# Patient Record
Sex: Male | Born: 1975
Health system: Southern US, Community
[De-identification: ages and names within clinical notes are randomized; demographics above are authoritative.]

## PROBLEM LIST (undated history)

## (undated) DIAGNOSIS — H539 Unspecified visual disturbance: Secondary | ICD-10-CM

## (undated) DIAGNOSIS — G35 Multiple sclerosis: Secondary | ICD-10-CM

## (undated) DIAGNOSIS — I10 Essential (primary) hypertension: Secondary | ICD-10-CM

## (undated) DIAGNOSIS — G35D Multiple sclerosis, unspecified: Secondary | ICD-10-CM

## (undated) HISTORY — DX: Unspecified visual disturbance: H53.9

## (undated) HISTORY — DX: Multiple sclerosis: G35

## (undated) HISTORY — DX: Essential (primary) hypertension: I10

## (undated) HISTORY — DX: Multiple sclerosis, unspecified: G35.D

## (undated) HISTORY — PX: ACHILLES TENDON SURGERY: SHX542

## (undated) HISTORY — PX: MENISCUS REPAIR: SHX5179

## (undated) HISTORY — PX: ANTERIOR CRUCIATE LIGAMENT REPAIR: SHX115

---

## 2013-10-15 DIAGNOSIS — S300XXA Contusion of lower back and pelvis, initial encounter: Secondary | ICD-10-CM

## 2016-01-04 ENCOUNTER — Ambulatory Visit: Payer: Self-pay | Admitting: Neurology

## 2016-03-07 ENCOUNTER — Encounter: Payer: Self-pay | Admitting: Neurology

## 2016-03-07 ENCOUNTER — Ambulatory Visit (INDEPENDENT_AMBULATORY_CARE_PROVIDER_SITE_OTHER): Payer: PRIVATE HEALTH INSURANCE | Admitting: Neurology

## 2016-03-07 DIAGNOSIS — G35 Multiple sclerosis: Secondary | ICD-10-CM | POA: Diagnosis not present

## 2016-03-07 DIAGNOSIS — R261 Paralytic gait: Secondary | ICD-10-CM | POA: Diagnosis not present

## 2016-03-07 DIAGNOSIS — R39198 Other difficulties with micturition: Secondary | ICD-10-CM

## 2016-03-07 DIAGNOSIS — R251 Tremor, unspecified: Secondary | ICD-10-CM

## 2016-03-07 MED ORDER — BACLOFEN 10 MG PO TABS: 10.0000 mg | ORAL_TABLET | Freq: Three times a day (TID) | ORAL | 11 refills | Status: DC

## 2016-03-07 NOTE — Progress Notes (Signed)
[facility]  PATIENT: Alexander [name] DOB: May 09, 1976  REFERRING DOCTOR OR PCP:  Dr. Carroll Sage SOURCE:    Notes from Dr. Caralyn Guile, patient   _________________________________   HISTORICAL  CHIEF COMPLAINT:  Chief Complaint  Patient presents with  . Referral    Referral from [facility] for Alexander [name]. Pt has had MS for 5 yers. Pt walks with a cane. Pt has taken ijection and infusion. Pt is Alexander [name]    HISTORY OF PRESENT ILLNESS:  I had the pleasure seeing you patient, Alexander [name], at [facility] for neurologic consultation regarding his multiple sclerosis.    A 40 year old man with multiple sclerosis diagnosed in 2003 transitioning from Gilenya to ocrelizumab.   He just moved to this area from [place]  MS history: In 2003 he had optic neuritis started on the left and then also on the right. He had an MRI of the brain performed at that time and was told that it was normal. He had follow-up MRIs every year or 2 for several more years but they continue to be normal. In 2012, he noticed worsening issues with gait. He had had some difficulty with the gait for the previous couple of years but due to orthopedic issues a neurologic cause was not looked into. An MRI of the cervical spine showed multiple plaques consistent with MS. Initially, he was placed on Copaxone. Although he tolerated Copaxone well he had some breakthrough disease with a new spot developing in the brain. Therefore he was switched to Tysabri. He tolerated Tysabri well and his MS was well controlled. However, she was JCV antibody [name] a decision was made to stop Tysabri after 2 years. Years ago he went on Gilenya. He has tolerated it well and he did better on Tysabri. He was seeing Dr. Carroll Sage at [facility] on [address]. He has enrolled him into Ocrevus and he is going to get the first half of the first dose next  week.  Gait/strength/sensation: He reports difficulty with gait and uses a cane. His left leg is weaker and less coordinated than the right leg. The left hand is also less coordinated than the right hand and he notes mild stiffness in both hands and more moderate stiffness in his legs.  He takes 10 mg of baclofen every morning and sometimes will take another pill later in the day. He denies any numbness in the arms or legs.  Tremor: He has a tremor in his hands that was helped by starting zonisamide 25 mg tablets. The tremor is worse when he is tired.  Bladder: He reports urinary frequency and urgency and has had some incontinence. He has not been tried on any bladder medications. He reports difficulty with urinary hesitancy. He does feel that he empties. He has not had a urinary tract infections.  Vision: He reports that the left eye he covered 100% but the right eye is not at baseline. He has not noticed difficulty with color vision.  Fatigue/sleep:   He denies any major problems with fatigue most of the time. However, he notes more fatigue when he is cold or when he is in hot weather. He generally sleeps well at night.  Mood/cognition: He denies any significant issues with depression or anxiety. He has been on Lexapro for many years. He does not note any major problems with cognition though he will have a little bit of mental fog at times. This was better when he was on  Tysabri than on Gilenya.   REVIEW OF SYSTEMS: Constitutional: No fevers, chills, sweats, or change in appetite.   Reports minimal fatigue. Eyes: No visual changes, double vision, eye pain Ear, nose and throat: No hearing loss, ear pain, nasal congestion, sore throat Cardiovascular: No chest pain, palpitations Respiratory: No shortness of breath at rest or with exertion.   No wheezes GastrointestinaI: No nausea, vomiting, diarrhea, abdominal pain, fecal incontinence Genitourinary: reports frequency, hesitancy and  nocturia. Musculoskeletal: No neck pain, back pain Integumentary: No rash, pruritus, skin lesions Neurological: as above Psychiatric: No depression at this time.  No anxiety Endocrine: No palpitations, diaphoresis, change in appetite, change in weigh or increased thirst Hematologic/Lymphatic: No anemia, purpura, petechiae. Allergic/Immunologic: No itchy/runny eyes, nasal congestion, recent allergic reactions, rashes  ALLERGIES: No Known Allergies  HOME MEDICATIONS:  Current Outpatient Prescriptions:  .  baclofen (LIORESAL) 10 MG tablet, , Disp: , Rfl:  .  escitalopram (LEXAPRO) 10 MG tablet, , Disp: , Rfl:  .  Ibuprofen 200 MG CAPS, Take by mouth., Disp: , Rfl:  .  zonisamide (ZONEGRAN) 25 MG capsule, , Disp: , Rfl:   PAST MEDICAL HISTORY: Past Medical History:  Diagnosis Date  . Multiple sclerosis (HCC)   . Vision abnormalities     PAST SURGICAL HISTORY: History reviewed. No pertinent surgical history.  FAMILY HISTORY: Family History  Problem Relation Age of Onset  . Stroke Maternal Grandfather     SOCIAL HISTORY:  Social History   Social History  . Marital status: Divorced    Spouse name: N/A  . Number of children: N/A  . Years of education: N/A   Occupational History  . Not on file.   Social History Main Topics  . Smoking status: Never Smoker  . Smokeless tobacco: Never Used  . Alcohol use No  . Drug use: No  . Sexual activity: Not on file   Other Topics Concern  . Not on file   Social History Narrative  . No narrative on file     PHYSICAL EXAM  Vitals:   03/07/16 1320  BP: (!) 133/91  Pulse: 79  Weight: 177 lb (80.3 kg)  Height:  (1.727 m)    Body mass index is 26.91 kg/m.   General: The patient is well-developed and well-nourished and in no acute distress  Eyes:  Funduscopic exam shows mild right optic nerve pallor and normal retinal vessels.    Cornea and sclera are normal.  Neck: The neck is supple, no carotid bruits are  noted.  The neck is nontender.  Cardiovascular: The heart has a regular rate and rhythm with a normal S1 and S2. There were no murmurs, gallops or rubs. Lungs are clear to auscultation.  Skin: Extremities are without rash or edema.      Musculoskeletal:  Back is nontender.   Joints have normal range of motion.   Neurologic Exam  Mental status: The patient is alert and oriented x 3 at the time of the examination. The patient has apparent normal recent and remote memory, with an apparently normal attention span and concentration ability.   Speech is normal.  Cranial nerves: Extraocular movements are full. Pupils show 1+ right APD.    Colors are desaturated on the right.  Visual fields are full.  Facial symmetry is present. There is reduced right facial sensation to soft touch and temperature.  .Facial strength is normal.  Trapezius and sternocleidomastoid strength is normal. No dysarthria is noted.  The tongue is midline, and the  patient has symmetric elevation of the soft palate. No obvious hearing deficits are noted.  Motor:  Muscle bulk is normal.   Tone is increased moderately in legs and mildly in left arm. Strength is  5 / 5 in all 4 extremities except 4+/5 EHL muscle  Sensory: Sensory testing shows reduced left vibration sensation in arm/leg and reduced temperature sensation on the right leg  Coordination: Cerebellar testing reveals good finger-nose-finger but slow left RAM and reduced left worse than right heel-to-shin.  Gait and station: Station is normal.   Gait is wide and spastic with mild left foot drop. cannot tandem. Romberg is [name] .   Reflexes: Deep tendon reflexes are symmetric and increased in legs,with spread at both knees and nonsustained clonus at left ankle.    Plantar responses is extensor on the left.    DIAGNOSTIC DATA (LABS, IMAGING, TESTING) - I reviewed patient records, labs, notes, testing and imaging myself where available.      ASSESSMENT AND  PLAN  Spastic gait  Multiple sclerosis (HCC)  Urinary dysfunction  Tremor    In summary, Mr. Alexander [name] is a 40 year old man with multiple sclerosis diagnosed about 5 years ago. He doubled his MS was best controlled on Tysabri but he was JCV antibody [name] and was therefore placed on Gilenya. He is in the process of being transitioned to ocrelizumab and has been off his Gilenya for 5 weeks. He will get his first dose of ocrelizumab up in [place] next week. He would like to get further infusions here in [place]. We will send information to the infusion center. I do not know if we will be able to get insurance preauthorization in time for the second half of his first infusion but we should be easily able to get further infusions here.    I have asked him to increase his baclofen to 10 mg by mouth 3 times a day. We discussed lateral medication but he would prefer to wait at this time. We also briefly discussed Ampyra for the gait disturbance and he would prefer to hold off until he sees how he does on ocrelizumab first.  I will try to get the actual MRI images on CD to personally review and so that we have them available for later comparisons. Additionally, I will try to get his recent laboratory tests as we will need to see negative labs for latent tuberculosis or chronic hepatitis.  He will return to see me in 4 months for regular visit or call sooner if there are new or worsening neurologic symptoms.    Achsah Mcquade A. Epimenio Foot, MD, PhD 03/07/2016, 1:39 PM Certified in Neurology, Clinical Neurophysiology, Sleep Medicine, Pain Medicine and Neuroimaging  [facility] [address], [address] [place], [place] [address] [phone]

## 2016-03-12 ENCOUNTER — Telehealth: Payer: Self-pay | Admitting: Neurology

## 2016-03-12 NOTE — Telephone Encounter (Signed)
After speaking with Inetta Fermo in the infusion suite, I spoke with Alexander [name] and advised that since the first 2 Ocrelizumab infusions are really one unit, (one full dose split into 2 to decrease any side effects), he will need to have both of those infusions in [place].  Once he's had the 2nd infusion, he will come in and sign a new Ocrelizumab srf so we can switch rx'ing physician to [facility], and new benefits investigation can be done to ensure coverage here in [place]/our office.   He verbalized understanding of same, is agreeable with this plan/fim

## 2016-03-12 NOTE — Telephone Encounter (Signed)
Patient called, states he will get first Ocrevus infusion Thursday August 10th in [place], would like to get the 2nd infusion here in [place]. Please call.

## 2016-03-20 ENCOUNTER — Telehealth: Payer: Self-pay | Admitting: *Deleted

## 2016-03-20 NOTE — Telephone Encounter (Signed)
Pt release fax to Dr. Caralyn Guile requesting labs from 2017. A release fax to [facility] requesting  Mri cd.

## 2016-04-29 ENCOUNTER — Ambulatory Visit (INDEPENDENT_AMBULATORY_CARE_PROVIDER_SITE_OTHER): Payer: PRIVATE HEALTH INSURANCE | Admitting: Internal Medicine

## 2016-04-29 ENCOUNTER — Encounter: Payer: Self-pay | Admitting: Internal Medicine

## 2016-04-29 VITALS — BP 140/88 | HR 83 | Temp 98.1°F | Resp 20 | Ht 67.0 in | Wt 179.0 lb

## 2016-04-29 DIAGNOSIS — Z0000 Encounter for general adult medical examination without abnormal findings: Secondary | ICD-10-CM

## 2016-04-29 DIAGNOSIS — Z23 Encounter for immunization: Secondary | ICD-10-CM

## 2016-04-29 MED ORDER — ESCITALOPRAM OXALATE 20 MG PO TABS: 20.0000 mg | ORAL_TABLET | Freq: Every day | ORAL | 6 refills | Status: DC

## 2016-04-29 NOTE — Progress Notes (Signed)
Subjective:    Patient ID: Alexander [name], male    DOB: 07/16/1976, 40 y.o.   MRN: [id number]  HPI  40 year old patient who is seen today to establish with our practice.  Both patients are followed at this practice.  [place] presently living with his parents.  His ex-wife and 42 year old daughter also relocated to the area.  He has a history of MS since at least 2003 and has establish with neurology.  Neurology's detailed note reviewed.  He also presented with optic neuritis and he states that he does have mild decreased visual acuity on the right.  Past medical history otherwise is fairly unremarkable.  He has had left anterior cruciate ligament and left, tendon rupture repair  Social history.  Divorced 42 year old daughter bachelor's degree.  Family history- father history of hypertension, colonic polyps and thrombocytopenia.  May have a component of ITP as well as early cirrhosis.  Status post CABG Mother history of hypertension, history Graves' disease allergic rhinitis One sister is in good health    Review of Systems  Constitutional: Negative for appetite change, chills, fatigue and fever.  HENT: Negative for congestion, dental problem, ear pain, hearing loss, sore throat, tinnitus, trouble swallowing and voice change.   Eyes: Negative for pain, discharge and visual disturbance.  Respiratory: Negative for cough, chest tightness, wheezing and stridor.   Cardiovascular: Negative for chest pain, palpitations and leg swelling.  Gastrointestinal: Negative for abdominal distention, abdominal pain, blood in stool, constipation, diarrhea, nausea and vomiting.  Genitourinary: Positive for difficulty urinating. Negative for discharge, flank pain, genital sores, hematuria and urgency.  Musculoskeletal: Positive for gait problem. Negative for arthralgias, back pain, joint swelling, myalgias and neck stiffness.  Skin: Negative for rash.  Neurological:  Positive for tremors. Negative for dizziness, syncope, speech difficulty, weakness, numbness and headaches.  Hematological: Negative for adenopathy. Does not bruise/bleed easily.  Psychiatric/Behavioral: Positive for dysphoric mood. Negative for behavioral problems. The patient is not nervous/anxious.        Objective:   Physical Exam  Constitutional: He appears well-developed and well-nourished.  HENT:  Head: Normocephalic and atraumatic.  Right Ear: External ear normal.  Left Ear: External ear normal.  Nose: Nose normal.  Mouth/Throat: Oropharynx is clear and moist.  Eyes: Conjunctivae and EOM are normal. Pupils are equal, round, and reactive to light. No scleral icterus.  Neck: Normal range of motion. Neck supple. No JVD present. No thyromegaly present.  Cardiovascular: Regular rhythm, normal heart sounds and intact distal pulses.  Exam reveals no gallop and no friction rub.   No murmur heard. Pulmonary/Chest: Effort normal and breath sounds normal. He exhibits no tenderness.  Abdominal: Soft. Bowel sounds are normal. He exhibits no distension and no mass. There is no tenderness.  Genitourinary: Penis normal.  Musculoskeletal: Normal range of motion. He exhibits no edema or tenderness.  Lymphadenopathy:    He has no cervical adenopathy.  Neurological: He is alert. He has normal reflexes. No cranial nerve deficit. Coordination normal.  Wide-based gait, walks with a cane Lower extremities with hyperreflexia and increased tone  Left Babinski response  Decreased vibratory sensation distally, left greater than the right Slight weakness left dorsiflexion ankle  Skin: Skin is warm and dry. No rash noted.  Psychiatric: He has a normal mood and affect. His behavior is normal.          Assessment & Plan:   MS.  With spastic gait.   Follow-up neurology.  Health records  have been requested and are pending History of optic neuritis.  Patient is status post with a local  ophthalmologist History depression.  Continue Lexapro 20.  Follow-up 6 months or as needed  Preventive health exam.  Flu vaccine administered  Rogelia Boga

## 2016-04-29 NOTE — Patient Instructions (Addendum)
Limit your sodium (Salt) intake  Please check your blood pressure on a regular basis.  If it is consistently greater than 150/90, please make an office appointment.    DASH Eating Plan DASH stands for "Dietary Approaches to Stop Hypertension." The DASH eating plan is a healthy eating plan that has been shown to reduce high blood pressure (hypertension). Additional health benefits may include reducing the risk of type 2 diabetes mellitus, heart disease, and stroke. The DASH eating plan may also help with weight loss. WHAT DO I NEED TO KNOW ABOUT THE DASH EATING PLAN? For the DASH eating plan, you will follow these general guidelines:  Choose foods with a percent daily value for sodium of less than 5% (as listed on the food label).  Use salt-free seasonings or herbs instead of table salt or sea salt.  Check with your health care provider or pharmacist before using salt substitutes.  Eat lower-sodium products, often labeled as "lower sodium" or "no salt added."  Eat fresh foods.  Eat more vegetables, fruits, and low-fat dairy products.  Choose whole grains. Look for the word "whole" as the first word in the ingredient list.  Choose fish and skinless chicken or turkey more often than red meat. Limit fish, poultry, and meat to 6 oz (170 g) each day.  Limit sweets, desserts, sugars, and sugary drinks.  Choose heart-healthy fats.  Limit cheese to 1 oz (28 g) per day.  Eat more home-cooked food and less restaurant, buffet, and fast food.  Limit fried foods.  Cook foods using methods other than frying.  Limit canned vegetables. If you do use them, rinse them well to decrease the sodium.  When eating at a restaurant, ask that your food be prepared with less salt, or no salt if possible. WHAT FOODS CAN I EAT? Seek help from a dietitian for individual calorie needs. Grains Whole grain or whole wheat bread. Brown rice. Whole grain or whole wheat pasta. Quinoa, bulgur, and whole grain  cereals. Low-sodium cereals. Corn or whole wheat flour tortillas. Whole grain cornbread. Whole grain crackers. Low-sodium crackers. Vegetables Fresh or frozen vegetables (raw, steamed, roasted, or grilled). Low-sodium or reduced-sodium tomato and vegetable juices. Low-sodium or reduced-sodium tomato sauce and paste. Low-sodium or reduced-sodium canned vegetables.  Fruits All fresh, canned (in natural juice), or frozen fruits. Meat and Other Protein Products Ground beef (85% or leaner), grass-fed beef, or beef trimmed of fat. Skinless chicken or turkey. Ground chicken or turkey. Pork trimmed of fat. All fish and seafood. Eggs. Dried beans, peas, or lentils. Unsalted nuts and seeds. Unsalted canned beans. Dairy Low-fat dairy products, such as skim or 1% milk, 2% or reduced-fat cheeses, low-fat ricotta or cottage cheese, or plain low-fat yogurt. Low-sodium or reduced-sodium cheeses. Fats and Oils Tub margarines without trans fats. Light or reduced-fat mayonnaise and salad dressings (reduced sodium). Avocado. Safflower, olive, or canola oils. Natural peanut or almond butter. Other Unsalted popcorn and pretzels. The items listed above may not be a complete list of recommended foods or beverages. Contact your dietitian for more options. WHAT FOODS ARE NOT RECOMMENDED? Grains White bread. White pasta. White rice. Refined cornbread. Bagels and croissants. Crackers that contain trans fat. Vegetables Creamed or fried vegetables. Vegetables in a cheese sauce. Regular canned vegetables. Regular canned tomato sauce and paste. Regular tomato and vegetable juices. Fruits Dried fruits. Canned fruit in light or heavy syrup. Fruit juice. Meat and Other Protein Products Fatty cuts of meat. Ribs, chicken wings, bacon, sausage, bologna, salami, chitterlings,   fatback, hot dogs, bratwurst, and packaged luncheon meats. Salted nuts and seeds. Canned beans with salt. Dairy Whole or 2% milk, cream, half-and-half, and  cream cheese. Whole-fat or sweetened yogurt. Full-fat cheeses or blue cheese. Nondairy creamers and whipped toppings. Processed cheese, cheese spreads, or cheese curds. Condiments Onion and garlic salt, seasoned salt, table salt, and sea salt. Canned and packaged gravies. Worcestershire sauce. Tartar sauce. Barbecue sauce. Teriyaki sauce. Soy sauce, including reduced sodium. Steak sauce. Fish sauce. Oyster sauce. Cocktail sauce. Horseradish. Ketchup and mustard. Meat flavorings and tenderizers. Bouillon cubes. Hot sauce. Tabasco sauce. Marinades. Taco seasonings. Relishes. Fats and Oils Butter, stick margarine, lard, shortening, ghee, and bacon fat. Coconut, palm kernel, or palm oils. Regular salad dressings. Other Pickles and olives. Salted popcorn and pretzels. The items listed above may not be a complete list of foods and beverages to avoid. Contact your dietitian for more information. WHERE CAN I FIND MORE INFORMATION? National Heart, Lung, and Blood Institute: www.nhlbi.nih.gov/health/health-topics/topics/dash/   This information is not intended to replace advice given to you by your health care provider. Make sure you discuss any questions you have with your health care provider.   Document Released: 07/11/2011 Document Revised: 08/12/2014 Document Reviewed: 05/26/2013 Elsevier Interactive Patient Education 2016 Elsevier Inc.  

## 2016-04-29 NOTE — Progress Notes (Signed)
Pre visit review using our clinic review tool, if applicable. No additional management support is needed unless otherwise documented below in the visit note. 

## 2016-05-14 ENCOUNTER — Telehealth: Payer: Self-pay | Admitting: Internal Medicine

## 2016-05-14 NOTE — Telephone Encounter (Signed)
Needs a referral to a neuro-optimoligist

## 2016-05-20 NOTE — Telephone Encounter (Signed)
Error

## 2016-06-11 ENCOUNTER — Other Ambulatory Visit: Payer: Self-pay | Admitting: *Deleted

## 2016-06-11 MED ORDER — ESCITALOPRAM OXALATE 20 MG PO TABS: 20.0000 mg | ORAL_TABLET | Freq: Every day | ORAL | 3 refills | Status: DC

## 2016-07-04 ENCOUNTER — Encounter: Payer: Self-pay | Admitting: *Deleted

## 2016-07-10 ENCOUNTER — Encounter: Payer: Self-pay | Admitting: Neurology

## 2016-07-10 ENCOUNTER — Ambulatory Visit (INDEPENDENT_AMBULATORY_CARE_PROVIDER_SITE_OTHER): Payer: BLUE CROSS/BLUE SHIELD | Admitting: Neurology

## 2016-07-10 VITALS — BP 162/100 | HR 94 | Resp 18 | Ht 67.0 in | Wt 183.0 lb

## 2016-07-10 DIAGNOSIS — R39198 Other difficulties with micturition: Secondary | ICD-10-CM

## 2016-07-10 DIAGNOSIS — R29898 Other symptoms and signs involving the musculoskeletal system: Secondary | ICD-10-CM

## 2016-07-10 DIAGNOSIS — R261 Paralytic gait: Secondary | ICD-10-CM

## 2016-07-10 DIAGNOSIS — G35 Multiple sclerosis: Secondary | ICD-10-CM

## 2016-07-10 DIAGNOSIS — R251 Tremor, unspecified: Secondary | ICD-10-CM | POA: Diagnosis not present

## 2016-07-10 MED ORDER — ZONISAMIDE 50 MG PO CAPS: 50.0000 mg | ORAL_CAPSULE | Freq: Every day | ORAL | 5 refills | Status: DC

## 2016-07-10 NOTE — Progress Notes (Signed)
[facility]  PATIENT: Alexander [name] DOB: 03/19/1976  REFERRING DOCTOR OR PCP:  Dr. Carroll Sage SOURCE:    Notes from Dr. Caralyn Guile, patient   _________________________________   HISTORICAL  CHIEF COMPLAINT:  Chief Complaint  Patient presents with  . Multiple Sclerosis    He has  had part A and part B of first Ocrelizumab infusion in [place], at [facility].  He has transferred care to Dr. Epimenio Foot and would like to have future infusions here in our office. Sts. he feels he has less dexterity in his hands (left worse than right).  Sts. legs/gait/balance are about the same. (Ambulatory with cane, slow gait). 25 ft. timed walk is 43 sec today (average of 2)/fim  . Gait Disturbance    HISTORY OF PRESENT ILLNESS:  Alexander [name] is a 40 year old man with multiple sclerosis diagnosed in 2003 transitioning from Gilenya to ocrelizumab.   He just moved to this area from [place].   He had his first 2 ocrelizumab doses in mid and late August and tolerated it well.  He denies definite exacerbation but feels more dexterity issues and numbness in both hands (Left worse than Right).   Typing is more difficult.  He deneis pain. Numbness does not awaken him but he is numb in the morning and shakes for a few minutes with benefit.    He feels it is better since stating Magnesium.    Gait/strength/sensation: He has difficulty with gait and uses a cane. Gait fluctuates a lot but has slowly worsened the past year.     His left leg is weaker and less coordinated than the right leg. The left hand is also less coordinated than the right hand and he notes mild stiffness in both hands and more moderate stiffness in his legs.  He takes 10 mg of baclofen tid with improvement in the spasticity and tightness.  He has hand numness but no leg numbness.        In the past, he was on Ampyra and felt it helped but he felt a little jumpy so stopped.    Tremor: He has a tremor  in his hands that was helped by starting zonisamide 25 mg tablets. The tremor is worse when he is tired and more noticeable with writing.    Bladder: He reports urinary frequency and urgency and has rare incontinence. He has not been tried on any bladder medications. He reports difficulty with urinary hesitancy. He does feel that he empties. He has not had a urinary tract infections.  Vision: He reports that the left eye recovered 100% but the right eye is not at baseline and has reduced . He has not noticed difficulty with color vision.  Fatigue/sleep:   He denies much fatigue. However, he notes some fatigue when he is cold or when he is in hot weather. He generally sleeps well at night.  Mood/cognition: He denies any significant issues with depression or anxiety. He is on Lexapro.  He does not have major problems with cognition though he will have a little bit of mental fog at times. This was better when he was on Tysabri than on Gilenya.  MS history: In 2003 he had optic neuritis started on the left and then also on the right. He had an MRI of the brain performed at that time and was told that it was normal. He had follow-up MRIs every year or 2 for several more years but they continue to be  normal. In 2012, he noticed worsening issues with gait. He had had some difficulty with the gait for the previous couple of years but due to orthopedic issues a neurologic cause was not looked into. An MRI of the cervical spine showed multiple plaques consistent with MS. Initially, he was placed on Copaxone. Although he tolerated Copaxone well he had some breakthrough disease with a new spot developing in the brain. Therefore he was switched to Tysabri. He tolerated Tysabri well and his MS was well controlled. However, she was JCV antibody positive a decision was made to stop Tysabri after 2 years. Years ago he went on Gilenya. He has tolerated it well and he did better on Tysabri. He was seeing Dr. Carroll Sage at [facility] on [address]. He had Ocrevus August 2017.      REVIEW OF SYSTEMS: Constitutional: No fevers, chills, sweats, or change in appetite.   Reports minimal fatigue. Eyes: No visual changes, double vision, eye pain Ear, nose and throat: No hearing loss, ear pain, nasal congestion, sore throat Cardiovascular: No chest pain, palpitations Respiratory: No shortness of breath at rest or with exertion.   No wheezes GastrointestinaI: No nausea, vomiting, diarrhea, abdominal pain, fecal incontinence Genitourinary: reports frequency, hesitancy and nocturia. Musculoskeletal: No neck pain, back pain Integumentary: No rash, pruritus, skin lesions Neurological: as above Psychiatric: No depression at this time.  No anxiety Endocrine: No palpitations, diaphoresis, change in appetite, change in weigh or increased thirst Hematologic/Lymphatic: No anemia, purpura, petechiae. Allergic/Immunologic: No itchy/runny eyes, nasal congestion, recent allergic reactions, rashes  ALLERGIES: No Known Allergies  HOME MEDICATIONS:  Current Outpatient Prescriptions:  .  baclofen (LIORESAL) 10 MG tablet, Take 1 tablet (10 mg total) by mouth 3 (three) times daily. (Patient taking differently: Take 10 mg by mouth as needed. ), Disp: 90 each, Rfl: 11 .  Cholecalciferol (VITAMIN D PO), Take by mouth. Take 5000-7000 units by mouth daily, Disp: , Rfl:  .  escitalopram (LEXAPRO) 20 MG tablet, Take 1 tablet (20 mg total) by mouth daily., Disp: 90 tablet, Rfl: 3 .  Multiple Vitamin (MULTIVITAMIN WITH MINERALS) TABS tablet, Take 1 tablet by mouth daily., Disp: , Rfl:  .  Nutritional Supplements (JUICE PLUS FIBRE) LIQD, Take 12 capsules by mouth daily. FRUITS AND VEGETABLES AND ANTI-OXIDANTS., Disp: , Rfl:  .  Omega-3 Fatty Acids (FISH OIL) 1000 MG CAPS, Take 2 capsules by mouth daily., Disp: , Rfl:  .  zonisamide (ZONEGRAN) 25 MG capsule, Take 25 mg by mouth daily. , Disp: , Rfl:   PAST MEDICAL  HISTORY: Past Medical History:  Diagnosis Date  . Hypertension   . Multiple sclerosis (HCC)   . Vision abnormalities     PAST SURGICAL HISTORY: No past surgical history on file.  FAMILY HISTORY: Family History  Problem Relation Age of Onset  . Stroke Maternal Grandfather     SOCIAL HISTORY:  Social History   Social History  . Marital status: Divorced    Spouse name: N/A  . Number of children: N/A  . Years of education: N/A   Occupational History  . Not on file.   Social History Main Topics  . Smoking status: Never Smoker  . Smokeless tobacco: Never Used  . Alcohol use No  . Drug use: No  . Sexual activity: Not on file   Other Topics Concern  . Not on file   Social History Narrative  . No narrative on file     PHYSICAL EXAM  Vitals:  07/10/16 1422  BP: (!) 162/100  Pulse: 94  Resp: 18  Weight: 183 lb (83 kg)  Height: 5\' 7"  (1.702 m)    Body mass index is 28.66 kg/m.   General: The patient is well-developed and well-nourished and in no acute distress  Eyes:  Funduscopic exam shows mild right optic nerve pallor and normal retinal vessels.    Cornea and sclera are normal.  Neck: The neck is supple, no carotid bruits are noted.  The neck is nontender.  Cardiovascular: The heart has a regular rate and rhythm with a normal S1 and S2. There were no murmurs, gallops or rubs. Lungs are clear to auscultation.  Skin: Extremities are without rash or edema.      Musculoskeletal:  Back is nontender.    Neurologic Exam  Mental status: The patient is alert and oriented x 3 at the time of the examination. The patient has apparent normal recent and remote memory, with an apparently normal attention span and concentration ability.   Speech is normal.  Cranial nerves: Extraocular movements are full. Pupils show 1+ right APD.    Colors are desaturated on the right.   Facial symmetry is present. There is reduced right facial sensation to soft touch and  temperature.  .Facial strength is normal.  Trapezius and sternocleidomastoid strength is normal. No dysarthria is noted.  The tongue is midline, and the patient has symmetric elevation of the soft palate. No obvious hearing deficits are noted.  Motor:  Muscle bulk is normal.   Tone is increased mildly in legs and mildly in left arm. Strength is  5 / 5 in all 4 extremities except 4+/5 EHL muscle  Sensory: Sensory testing shows reduced left vibration sensation in arm/leg and reduced temperature sensation on the right leg  Coordination: Cerebellar testing reveals good finger-nose-finger but slow left RAM and reduced left worse than right heel-to-shin.  Gait and station: Station is normal.   Gait is wide and spastic with mild left foot drop. He cannot tandem. Romberg is positive .   Reflexes: Deep tendon reflexes are symmetric and increased in legs,with spread at both knees and nonsustained clonus at left ankle.    Plantar responses is extensor on the left.  25 foot time walk was 21.5 seconds (average of two trials)    DIAGNOSTIC DATA (LABS, IMAGING, TESTING) - I reviewed patient records, labs, notes, testing and imaging myself where available.      ASSESSMENT AND PLAN  Multiple sclerosis (HCC) - Plan: CBC with Differential/Platelet, Comprehensive metabolic panel  Spastic gait  Urinary dysfunction  Tremor  Hand weakness - Plan: NCV with EMG(electromyography)    1.   Ocrelizumab, next dose in February.  He had questions about vitamins, food supplements and MS. I said that the best evidence is present. Vitamin D. There are some studies that show that biotin and alpha lipoic acid could be of benefit. He was unable to tolerate biotin in the past. He will consider getting alpha lipoic acid 600 mg by mouth twice a day. 2.   Ampyra for walking. 3.   Due to his physical impairments with both hand and leg weakness, he is permanently and totally disabled.    4.   If arm weakness worsens  further, consider NCV/EMG.     He will return to see me in 4 months for regular visit or call sooner if there are new or worsening neurologic symptoms.  45 minutes face-to-face evaluation with greater one time counseling or coordinating  care about his MS, gait disturbance and other symptoms.   Shamira Toutant A. Epimenio Foot, MD, PhD 07/10/2016, 2:47 PM Certified in Neurology, Clinical Neurophysiology, Sleep Medicine, Pain Medicine and Neuroimaging  [facility] [address] [place], [place] [address] [phone]

## 2016-07-11 ENCOUNTER — Telehealth: Payer: Self-pay | Admitting: *Deleted

## 2016-07-11 LAB — CBC WITH DIFFERENTIAL/PLATELET
BASOS: 1 %
Basophils Absolute: 0.1 10*3/uL (ref 0.0–0.2)
EOS (ABSOLUTE): 0.4 10*3/uL (ref 0.0–0.4)
EOS: 7 %
HEMATOCRIT: 44.8 % (ref 37.5–51.0)
Hemoglobin: 15.4 g/dL (ref 13.0–17.7)
Immature Grans (Abs): 0 10*3/uL (ref 0.0–0.1)
Immature Granulocytes: 0 %
LYMPHS ABS: 1.3 10*3/uL (ref 0.7–3.1)
Lymphs: 19 %
MCH: 28.8 pg (ref 26.6–33.0)
MCHC: 34.4 g/dL (ref 31.5–35.7)
MCV: 84 fL (ref 79–97)
MONOS ABS: 0.7 10*3/uL (ref 0.1–0.9)
Monocytes: 11 %
NEUTROS PCT: 62 %
Neutrophils Absolute: 4.1 10*3/uL (ref 1.4–7.0)
PLATELETS: 212 10*3/uL (ref 150–379)
RBC: 5.35 x10E6/uL (ref 4.14–5.80)
RDW: 14 % (ref 12.3–15.4)
WBC: 6.6 10*3/uL (ref 3.4–10.8)

## 2016-07-11 LAB — COMPREHENSIVE METABOLIC PANEL
A/G RATIO: 2 (ref 1.2–2.2)
ALK PHOS: 42 IU/L (ref 39–117)
ALT: 26 IU/L (ref 0–44)
AST: 15 IU/L (ref 0–40)
Albumin: 4.6 g/dL (ref 3.5–5.5)
BUN/Creatinine Ratio: 13 (ref 9–20)
BUN: 13 mg/dL (ref 6–24)
Bilirubin Total: 0.5 mg/dL (ref 0.0–1.2)
CO2: 25 mmol/L (ref 18–29)
Calcium: 9.7 mg/dL (ref 8.7–10.2)
Chloride: 103 mmol/L (ref 96–106)
Creatinine, Ser: 0.97 mg/dL (ref 0.76–1.27)
GFR calc Af Amer: 111 mL/min/{1.73_m2} (ref >59)
GFR calc non Af Amer: 96 mL/min/{1.73_m2} (ref >59)
GLOBULIN, TOTAL: 2.3 g/dL (ref 1.5–4.5)
Glucose: 71 mg/dL (ref 65–99)
Potassium: 4.5 mmol/L (ref 3.5–5.2)
SODIUM: 144 mmol/L (ref 134–144)
Total Protein: 6.9 g/dL (ref 6.0–8.5)

## 2016-07-11 NOTE — Telephone Encounter (Signed)
-----   Message from Asa Lente, MD sent at 07/11/2016  9:07 AM EST ----- Please let him know labs are fine

## 2016-07-11 NOTE — Telephone Encounter (Signed)
I have spoken with Alexander [name] this morning, and per RAS, advised that labs done in our office yesterday look good. He verbalized understanding of same/fim

## 2016-07-12 ENCOUNTER — Encounter: Payer: Self-pay | Admitting: *Deleted

## 2016-07-12 ENCOUNTER — Telehealth: Payer: Self-pay | Admitting: *Deleted

## 2016-07-12 NOTE — Telephone Encounter (Signed)
Release faxed to [facility], requesting medical records.

## 2016-07-16 ENCOUNTER — Encounter: Payer: Self-pay | Admitting: *Deleted

## 2016-07-16 ENCOUNTER — Telehealth: Payer: Self-pay | Admitting: *Deleted

## 2016-07-16 NOTE — Telephone Encounter (Signed)
Ampyra PA completed and faxed to [facility] fax# [phone]/fim

## 2016-07-17 NOTE — Telephone Encounter (Signed)
Fax received from [facility] [place].  Ampyra PA approved.  Effective dates: 07-16-16 thru 12-31-16.  Ref# [id number]/fim

## 2016-08-28 ENCOUNTER — Ambulatory Visit (INDEPENDENT_AMBULATORY_CARE_PROVIDER_SITE_OTHER): Payer: BLUE CROSS/BLUE SHIELD | Admitting: Neurology

## 2016-08-28 ENCOUNTER — Encounter (INDEPENDENT_AMBULATORY_CARE_PROVIDER_SITE_OTHER): Payer: BLUE CROSS/BLUE SHIELD

## 2016-08-28 DIAGNOSIS — R261 Paralytic gait: Secondary | ICD-10-CM

## 2016-08-28 DIAGNOSIS — R29898 Other symptoms and signs involving the musculoskeletal system: Secondary | ICD-10-CM

## 2016-08-28 DIAGNOSIS — G35 Multiple sclerosis: Secondary | ICD-10-CM

## 2016-08-28 NOTE — Progress Notes (Signed)
Patient decided that he would prefer not to do the NCV/EMG as he feels most of his symptoms are from his multiple sclerosis. We discussed checking an MRI of the brain and cervical spine due to the worsening symptoms to determine if there has been subclinical progression. If present,, we will need to consider a disease modifying therapy. We also will schedule his ocrelizumab infusion.

## 2016-09-06 DIAGNOSIS — Z0271 Encounter for disability determination: Secondary | ICD-10-CM

## 2016-09-11 ENCOUNTER — Ambulatory Visit (INDEPENDENT_AMBULATORY_CARE_PROVIDER_SITE_OTHER): Payer: BLUE CROSS/BLUE SHIELD

## 2016-09-11 DIAGNOSIS — G35 Multiple sclerosis: Secondary | ICD-10-CM

## 2016-09-11 DIAGNOSIS — R29898 Other symptoms and signs involving the musculoskeletal system: Secondary | ICD-10-CM | POA: Diagnosis not present

## 2016-09-11 DIAGNOSIS — R261 Paralytic gait: Secondary | ICD-10-CM | POA: Diagnosis not present

## 2016-09-11 MED ORDER — GADOPENTETATE DIMEGLUMINE 469.01 MG/ML IV SOLN: 17.0000 mL | Freq: Once | INTRAVENOUS | Status: DC | PRN

## 2016-09-13 ENCOUNTER — Telehealth: Payer: Self-pay | Admitting: *Deleted

## 2016-09-13 NOTE — Telephone Encounter (Signed)
I have spoken with Roper this morning and reviewed MRI results as below.  He verbalized understanding of same/fim

## 2016-09-13 NOTE — Telephone Encounter (Signed)
-----   Message from Asa Lente, MD sent at 09/12/2016  6:02 PM EST ----- Please let him know that the MRI's did not show any brand-new lesions. He does have several plaques in the spinal cord which likely explains his weakness in the arms.,    Or nerves.

## 2016-09-19 DIAGNOSIS — Z0289 Encounter for other administrative examinations: Secondary | ICD-10-CM

## 2016-09-25 ENCOUNTER — Other Ambulatory Visit: Payer: Self-pay | Admitting: Neurology

## 2016-09-25 DIAGNOSIS — G35 Multiple sclerosis: Secondary | ICD-10-CM

## 2016-09-25 DIAGNOSIS — Z79899 Other long term (current) drug therapy: Secondary | ICD-10-CM

## 2016-09-26 ENCOUNTER — Other Ambulatory Visit (INDEPENDENT_AMBULATORY_CARE_PROVIDER_SITE_OTHER): Payer: Self-pay

## 2016-09-26 DIAGNOSIS — G35 Multiple sclerosis: Secondary | ICD-10-CM

## 2016-09-26 DIAGNOSIS — Z79899 Other long term (current) drug therapy: Secondary | ICD-10-CM

## 2016-09-26 DIAGNOSIS — Z0289 Encounter for other administrative examinations: Secondary | ICD-10-CM

## 2016-09-27 LAB — HEPATITIS B CORE ANTIBODY, TOTAL: HEP B C TOTAL AB: NEGATIVE

## 2016-09-27 LAB — HEPATITIS B SURFACE ANTIGEN: Hepatitis B Surface Ag: NEGATIVE

## 2016-09-27 LAB — HEPATITIS B SURFACE ANTIBODY,QUALITATIVE: HEP B SURFACE AB, QUAL: NONREACTIVE

## 2016-10-01 LAB — QUANTIFERON IN TUBE
QFT TB AG MINUS NIL VALUE: <0 IU/mL
QUANTIFERON MITOGEN VALUE: 6.54 IU/mL
QUANTIFERON NIL VALUE: 0.03 [IU]/mL
QUANTIFERON TB AG VALUE: 0.02 [IU]/mL
QUANTIFERON TB GOLD: NEGATIVE

## 2016-10-01 LAB — QUANTIFERON TB GOLD ASSAY (BLOOD)

## 2016-10-28 ENCOUNTER — Encounter: Payer: Self-pay | Admitting: Neurology

## 2016-10-28 ENCOUNTER — Ambulatory Visit (INDEPENDENT_AMBULATORY_CARE_PROVIDER_SITE_OTHER): Payer: BLUE CROSS/BLUE SHIELD | Admitting: Neurology

## 2016-10-28 ENCOUNTER — Encounter (INDEPENDENT_AMBULATORY_CARE_PROVIDER_SITE_OTHER): Payer: Self-pay

## 2016-10-28 VITALS — BP 128/78 | HR 64 | Resp 16 | Ht 67.0 in | Wt 178.0 lb

## 2016-10-28 DIAGNOSIS — R261 Paralytic gait: Secondary | ICD-10-CM

## 2016-10-28 DIAGNOSIS — Z79899 Other long term (current) drug therapy: Secondary | ICD-10-CM

## 2016-10-28 DIAGNOSIS — R39198 Other difficulties with micturition: Secondary | ICD-10-CM

## 2016-10-28 DIAGNOSIS — R251 Tremor, unspecified: Secondary | ICD-10-CM | POA: Diagnosis not present

## 2016-10-28 DIAGNOSIS — G35 Multiple sclerosis: Secondary | ICD-10-CM

## 2016-10-28 DIAGNOSIS — E559 Vitamin D deficiency, unspecified: Secondary | ICD-10-CM

## 2016-10-28 DIAGNOSIS — R7989 Other specified abnormal findings of blood chemistry: Secondary | ICD-10-CM

## 2016-10-28 MED ORDER — BACLOFEN 10 MG PO TABS: ORAL_TABLET | ORAL | 11 refills | Status: DC

## 2016-10-28 NOTE — Progress Notes (Signed)
[facility]  PATIENT: Alexander [name] DOB: 07/30/1976  REFERRING DOCTOR OR PCP:  Dr. Eleonore Chiquito (PCP)  _________________________________   HISTORICAL  CHIEF COMPLAINT:  Chief Complaint  Patient presents with  . Multiple Sclerosis    Sts. he continues to tolerate Ocrelizumab infusion.  600mg  IV given end of Feb. 2018.  Sts. Ampyra did'n't significantly help, and felt left sided weakness was worse with it, so he stopped it. Sts. is weaning himself off of Zonegran/fim    HISTORY OF PRESENT ILLNESS:  Alexander [name] is a 41 year old man with multiple sclerosis diagnosed in 2003 on ocrelizumab.     MS:   He had his first 2 ocrelizumab doses in mid and late August and tolerated it well. Second infusion was in February and went well.    He denies definite exacerbation but feels more dexterity issues and numbness in both hands (Left worse than Right).   Typing is more difficult.  He deneis pain. Numbness does not awaken him but he is numb in the morning and shakes for a few minutes with benefit.    He feels it is better since stating Magnesium.    Gait/strength/sensation: He has difficulty with gait and uses a cane. Gait fluctuates a lot but has slowly worsened the past year.     His left leg is weaker and less coordinated than the right leg.  He notes more problems with his hands (left worse than right).   Coordination is worse. He has spasticity in his legs.   He takes 10 mg of baclofen tid with improvement in the spasticity and tightness.  He tolerates it well.  He has hand numness but no leg numbness.     Ampyra made him feel worse so he stopped.      Tremor: He has a tremor in his hands.  He was placed on zonisamide by his previous neurologist but it has not helped much .   The tremor is worse when he is tired and more noticeable with writing.    Bladder: He reports urinary frequency and urgency.  No recent incontinence. He has not been tried on any bladder  medications. He reports difficulty with urinary hesitancy.   He has not had a urinary tract infections.  Vision: He has had optic neuritis in the past. The left eye recovered 100%. The right eye improved from the initial optic neuritis but not to baseline.    Np diplopia  Fatigue/sleep:   He notes mild fatigue when he is cold or when he is in hot weather. He generally sleeps well at night.  Mood/cognition: He denies any significant issues with depression or anxiety. He is on Lexapro.  He does not have major problems with cognition though he will have a little bit of mental fog at times. This was better when he was on Tysabri than on Gilenya.  MS history: In 2003 he had optic neuritis started on the left and then also on the right. He had an MRI of the brain performed at that time and was told that it was normal. He had follow-up MRIs every year or 2 for several more years but they continue to be normal. In 2012, he noticed worsening issues with gait. He had had some difficulty with the gait for the previous couple of years but due to orthopedic issues a neurologic cause was not looked into. An MRI of the cervical spine showed multiple plaques consistent with MS. Initially, he was placed on Copaxone.  Although he tolerated Copaxone well he had some breakthrough disease with a new spot developing in the brain. Therefore he was switched to Tysabri. He tolerated Tysabri well and his MS was well controlled. However, she was JCV antibody positive a decision was made to stop Tysabri after 2 years. Years ago he went on Gilenya. He has tolerated it well and he did better on Tysabri. He was seeing Dr. Carroll Sage at [facility] on [place]. He had Ocrevus August 43 2017.      REVIEW OF SYSTEMS: Constitutional: No fevers, chills, sweats, or change in appetite.   Reports minimal fatigue. Eyes: No visual changes, double vision, eye pain Ear, nose and throat: No hearing loss, ear pain, nasal  congestion, sore throat Cardiovascular: No chest pain, palpitations Respiratory: No shortness of breath at rest or with exertion.   No wheezes GastrointestinaI: No nausea, vomiting, diarrhea, abdominal pain, fecal incontinence Genitourinary: reports frequency, hesitancy and nocturia. Musculoskeletal: No neck pain, back pain Integumentary: No rash, pruritus, skin lesions Neurological: as above Psychiatric: No depression at this time.  No anxiety Endocrine: No palpitations, diaphoresis, change in appetite, change in weigh or increased thirst Hematologic/Lymphatic: No anemia, purpura, petechiae. Allergic/Immunologic: No itchy/runny eyes, nasal congestion, recent allergic reactions, rashes  ALLERGIES: No Known Allergies  HOME MEDICATIONS:  Current Outpatient Prescriptions:  .  baclofen (LIORESAL) 10 MG tablet, Take 5 pills daily po  in divided dose, Disp: 90 each, Rfl: 11 .  Cholecalciferol (VITAMIN D PO), Take by mouth. Take 5000-7000 units by mouth daily, Disp: , Rfl:  .  escitalopram (LEXAPRO) 20 MG tablet, Take 1 tablet (20 mg total) by mouth daily., Disp: 90 tablet, Rfl: 3 .  Multiple Vitamin (MULTIVITAMIN WITH MINERALS) TABS tablet, Take 1 tablet by mouth daily., Disp: , Rfl:  .  Nutritional Supplements (JUICE PLUS FIBRE) LIQD, Take 12 capsules by mouth daily. FRUITS AND VEGETABLES AND ANTI-OXIDANTS., Disp: , Rfl:  .  ocrelizumab 600 mg in sodium chloride 0.9 % 500 mL, Inject 600 mg into the vein every 6 (six) months., Disp: , Rfl:  .  Omega-3 Fatty Acids (FISH OIL) 1000 MG CAPS, Take 2 capsules by mouth daily., Disp: , Rfl:  No current facility-administered medications for this visit.   Facility-Administered Medications Ordered in Other Visits:  .  gadopentetate dimeglumine (MAGNEVIST) injection 17 mL, 17 mL, Intravenous, Once PRN, Anson Fret, MD  PAST MEDICAL HISTORY: Past Medical History:  Diagnosis Date  . Hypertension   . Multiple sclerosis (HCC)   . Vision  abnormalities     PAST SURGICAL HISTORY: No past surgical history on file.  FAMILY HISTORY: Family History  Problem Relation Age of Onset  . Stroke Maternal Grandfather     SOCIAL HISTORY:  Social History   Social History  . Marital status: Divorced    Spouse name: N/A  . Number of children: N/A  . Years of education: N/A   Occupational History  . Not on file.   Social History Main Topics  . Smoking status: Never Smoker  . Smokeless tobacco: Never Used  . Alcohol use No  . Drug use: No  . Sexual activity: Not on file   Other Topics Concern  . Not on file   Social History Narrative  . No narrative on file     PHYSICAL EXAM  Vitals:   10/28/16 1114  BP: 128/78  Pulse: 64  Resp: 16  Weight: 178 lb (80.7 kg)  Height: 5\' 7"  (1.702 m)  Body mass index is 27.88 kg/m.   General: The patient is well-developed and well-nourished and in no acute distress   Neurologic Exam  Mental status: The patient is alert and oriented x 3 at the time of the examination. The patient has apparent normal recent and remote memory, with an apparently normal attention span and concentration ability.   Speech is normal.  Cranial nerves: Extraocular movements are full. Pupils show 1+ right APD.    Colors are desaturated on the right.   Facial symmetry is present. There is reduced right facial sensation to soft touch and temperature.  .Facial strength is normal.  Trapezius and sternocleidomastoid strength is normal. No dysarthria is noted.  The tongue is midline, and the patient has symmetric elevation of the soft palate. No obvious hearing deficits are noted.  Motor:  Muscle bulk is normal.   Tone is increased mildly in legs and mildly in left arm. Strength is  5 / 5 in all 4 extremities except 4+/5 EHL muscle  Sensory: Sensory testing shows reduced left vibration sensation in arm/leg and reduced temperature sensation on the right leg  Coordination: Cerebellar testing reveals good  finger-nose-finger but slow left RAM and reduced left worse than right heel-to-shin.  Gait and station: Station is normal.   Gait is wide and spastic with mild left foot drop. He cannot tandem. Romberg is positive .   Reflexes: Deep tendon reflexes are symmetric and increased in legs,with spread at both knees and nonsustained clonus at left ankle.    Plantar responses is extensor on the left.     DIAGNOSTIC DATA (LABS, IMAGING, TESTING) - I reviewed patient records, labs, notes, testing and imaging myself where available.      ASSESSMENT AND PLAN  Multiple sclerosis (HCC) - Plan: CBC with Differential/Platelet, VITAMIN D 25 Hydroxy (Vit-D Deficiency, Fractures), Vitamin B12, Comprehensive metabolic panel  High risk medication use  Spastic gait  Tremor  Urinary dysfunction  Low vitamin D level - Plan: VITAMIN D 25 Hydroxy (Vit-D Deficiency, Fractures)   1.   Continue ocrelizumab, next dose in August.  He had questions about stem cell treatments. He matter paretic stem cells have been shown in noncontrolled studies to probably have excellent benefit for MS. Mesenchymal stem cells have not been shown to have significant benefit though there is very limited data.. 2.   Check CBC, CMP, B12 and vitamin D. 3.   Due to his physical impairments with both hand and leg weakness, he is permanently and totally disabled.    4.   He will return to see me in 4 months for regular visit or call sooner if there are new or worsening neurologic symptoms.  45 minute face-to-face evaluation with greater one time counseling or coordinating care about his MS, gait disturbance and other symptoms.   Khadejah Son A. Epimenio Foot, MD, PhD 10/28/2016, 6:24 PM Certified in Neurology, Clinical Neurophysiology, Sleep Medicine, Pain Medicine and Neuroimaging  [facility] [address] [place], [place] [address] [phone]

## 2016-10-29 ENCOUNTER — Telehealth: Payer: Self-pay | Admitting: *Deleted

## 2016-10-29 LAB — COMPREHENSIVE METABOLIC PANEL
ALT: 25 IU/L (ref 0–44)
AST: 17 IU/L (ref 0–40)
Albumin/Globulin Ratio: 2.2 (ref 1.2–2.2)
Albumin: 4.8 g/dL (ref 3.5–5.5)
Alkaline Phosphatase: 47 IU/L (ref 39–117)
BILIRUBIN TOTAL: 0.8 mg/dL (ref 0.0–1.2)
BUN/Creatinine Ratio: 12 (ref 9–20)
BUN: 12 mg/dL (ref 6–24)
CALCIUM: 9.8 mg/dL (ref 8.7–10.2)
CHLORIDE: 103 mmol/L (ref 96–106)
CO2: 26 mmol/L (ref 18–29)
CREATININE: 0.97 mg/dL (ref 0.76–1.27)
GFR calc non Af Amer: 96 mL/min/{1.73_m2} (ref >59)
GFR, EST AFRICAN AMERICAN: 111 mL/min/{1.73_m2} (ref >59)
Globulin, Total: 2.2 g/dL (ref 1.5–4.5)
Glucose: 87 mg/dL (ref 65–99)
Potassium: 5.2 mmol/L (ref 3.5–5.2)
Sodium: 145 mmol/L — ABNORMAL HIGH (ref 134–144)
TOTAL PROTEIN: 7 g/dL (ref 6.0–8.5)

## 2016-10-29 LAB — CBC WITH DIFFERENTIAL/PLATELET
BASOS: 2 %
Basophils Absolute: 0.1 10*3/uL (ref 0.0–0.2)
EOS (ABSOLUTE): 0.5 10*3/uL — ABNORMAL HIGH (ref 0.0–0.4)
Eos: 10 %
HEMATOCRIT: 45.3 % (ref 37.5–51.0)
Hemoglobin: 15.6 g/dL (ref 13.0–17.7)
Immature Grans (Abs): 0 10*3/uL (ref 0.0–0.1)
Immature Granulocytes: 0 %
LYMPHS ABS: 0.9 10*3/uL (ref 0.7–3.1)
Lymphs: 17 %
MCH: 28.8 pg (ref 26.6–33.0)
MCHC: 34.4 g/dL (ref 31.5–35.7)
MCV: 84 fL (ref 79–97)
MONOS ABS: 0.5 10*3/uL (ref 0.1–0.9)
Monocytes: 10 %
NEUTROS ABS: 3.1 10*3/uL (ref 1.4–7.0)
Neutrophils: 61 %
Platelets: 194 10*3/uL (ref 150–379)
RBC: 5.41 x10E6/uL (ref 4.14–5.80)
RDW: 14.5 % (ref 12.3–15.4)
WBC: 5.1 10*3/uL (ref 3.4–10.8)

## 2016-10-29 LAB — VITAMIN D 25 HYDROXY (VIT D DEFICIENCY, FRACTURES): Vit D, 25-Hydroxy: 59.3 ng/mL (ref 30.0–100.0)

## 2016-10-29 LAB — VITAMIN B12: Vitamin B-12: 830 pg/mL (ref 232–1245)

## 2016-10-29 NOTE — Telephone Encounter (Signed)
error/fim. 

## 2016-10-29 NOTE — Telephone Encounter (Signed)
I have spoken with Alexander [name] this afternoon and per RAS, explained that labs look good--including vit d and vit. b12.  He verbalized understanding of same/fim

## 2016-10-29 NOTE — Telephone Encounter (Signed)
-----   Message from Asa Lente, MD sent at 10/29/2016  9:24 AM EDT ----- Please let him know that the lab work looks fine. The vitamin B12 and vitamin D levels were normal.

## 2016-12-04 ENCOUNTER — Other Ambulatory Visit: Payer: Self-pay | Admitting: Neurology

## 2017-01-20 ENCOUNTER — Telehealth: Payer: Self-pay | Admitting: Neurology

## 2017-01-20 NOTE — Telephone Encounter (Signed)
Pt called wanting Dr Benay Spice to know he has been using CBD oil for the last few days (its hemp oil  .25-.5 liters of 500 mg) he wants to know if this is okay to use with other medications, please call

## 2017-01-20 NOTE — Telephone Encounter (Signed)
I have spoken with Alexander [name] this afternoon, and per RAS, advised CBD oil will not interfere with his MS meds.  He verbalized understanding of same/fim

## 2017-02-20 ENCOUNTER — Ambulatory Visit (INDEPENDENT_AMBULATORY_CARE_PROVIDER_SITE_OTHER): Payer: BLUE CROSS/BLUE SHIELD | Admitting: Neurology

## 2017-02-20 ENCOUNTER — Encounter: Payer: Self-pay | Admitting: Neurology

## 2017-02-20 VITALS — BP 141/85 | HR 80 | Ht 67.0 in | Wt 178.0 lb

## 2017-02-20 DIAGNOSIS — R29898 Other symptoms and signs involving the musculoskeletal system: Secondary | ICD-10-CM

## 2017-02-20 DIAGNOSIS — R261 Paralytic gait: Secondary | ICD-10-CM | POA: Diagnosis not present

## 2017-02-20 DIAGNOSIS — G35 Multiple sclerosis: Secondary | ICD-10-CM | POA: Diagnosis not present

## 2017-02-20 DIAGNOSIS — R39198 Other difficulties with micturition: Secondary | ICD-10-CM | POA: Diagnosis not present

## 2017-02-20 DIAGNOSIS — Z79899 Other long term (current) drug therapy: Secondary | ICD-10-CM

## 2017-02-20 DIAGNOSIS — R251 Tremor, unspecified: Secondary | ICD-10-CM

## 2017-02-20 MED ORDER — BACLOFEN 10 MG PO TABS: 10.0000 mg | ORAL_TABLET | Freq: Three times a day (TID) | ORAL | 3 refills | Status: DC

## 2017-02-20 NOTE — Progress Notes (Signed)
[facility]  PATIENT: Alexander [name] DOB: 01/21/1976  REFERRING DOCTOR OR PCP:  Dr. Eleonore Chiquito (PCP)  _________________________________   HISTORICAL  CHIEF COMPLAINT:  Chief Complaint  Patient presents with  . Multiple Sclerosis    Ocrevus due in [place].  Sts. balance has been some worse in the last couple of weeks./fim    HISTORY OF PRESENT ILLNESS:  Alexander [name] is a 41 year old man with multiple sclerosis diagnosed in 2003 on ocrelizumab.     MS:   He had  ocrelizumab doses in mid and late August and second infusion was in February and went well. His next one should be in August.     He denies definite exacerbation.    He feels mostly stable.  Gait/strength/sensation: gait is poor due to poor balance and left > right weakness.     He fell to the left earlier today in the office but no LOC.   Coordination is poor in his hands, left is worse.  [name] He has spasticity in his legs.   He takes 10 mg of baclofen tid with improvement in the spasticity and tightness.  He tolerates it well.  He has hand numness but no leg numbness.     Ampyra made him feel worse so he stopped.      Truncal dyesthesia:    He has started CBD oil and feels pain is better in the flanks and he no longer has a MS hug.    He was able to cut the baclofen to just 10-20 mg daily.    Tremor: He has a tremor in his hands.  He was placed on zonisamide by his previous neurologist but it has not helped much .   The tremor is worse when he is tired and more noticeable with writing.    Bladder: He reports urinary frequency and urgency but no incontinence.    He has not had a urinary tract infections.  Vision: He has had optic neuritis in the past in both eyes.  The left eye recovered 100% but the right eyye is not at baseline..   Fatigue/sleep:   He has had a lot more fatigue that is much worse in the hot weather.   He had one episode where he got overheated and legs were much weaker all day.    He generally sleeps well at night.  Mood/cognition: He denies any significant issues with depression or anxiety. He is on Lexapro.  He does not have major problems with cognition though he will have a little bit of mental fog at times. This was best on Tysabri but is better on ocrelizumab than on Gilenya.  MS history: In 2003 he had optic neuritis started on the left and then also on the right. He had an MRI of the brain performed at that time and was told that it was normal. He had follow-up MRIs every year or 2 for several more years but they continue to be normal. In 2012, he noticed worsening issues with gait. He had had some difficulty with the gait for the previous couple of years but due to orthopedic issues a neurologic cause was not looked into. An MRI of the cervical spine showed multiple plaques consistent with MS. Initially, he was placed on Copaxone. Although he tolerated Copaxone well he had some breakthrough disease with a new spot developing in the brain. Therefore he was switched to Tysabri. He tolerated Tysabri well and his MS was well controlled. However, she was  JCV antibody positive a decision was made to stop Tysabri after 2 years. Years ago he went on Gilenya. He has tolerated it well and he did better on Tysabri. He was seeing Dr. Carroll Sage at [facility] on [place]. He had Ocrevus August 2017.      REVIEW OF SYSTEMS: Constitutional: No fevers, chills, sweats, or change in appetite.   Reports minimal fatigue. Eyes: No visual changes, double vision, eye pain Ear, nose and throat: No hearing loss, ear pain, nasal congestion, sore throat Cardiovascular: No chest pain, palpitations Respiratory: No shortness of breath at rest or with exertion.   No wheezes GastrointestinaI: No nausea, vomiting, diarrhea, abdominal pain, fecal incontinence Genitourinary: reports frequency, hesitancy and nocturia. Musculoskeletal: No neck pain, back pain Integumentary: No  rash, pruritus, skin lesions Neurological: as above Psychiatric: No depression at this time.  No anxiety Endocrine: No palpitations, diaphoresis, change in appetite, change in weigh or increased thirst Hematologic/Lymphatic: No anemia, purpura, petechiae. Allergic/Immunologic: No itchy/runny eyes, nasal congestion, recent allergic reactions, rashes  ALLERGIES: No Known Allergies  HOME MEDICATIONS:  Current Outpatient Prescriptions:  .  baclofen (LIORESAL) 10 MG tablet, Take 1 tablet (10 mg total) by mouth 3 (three) times daily., Disp: 270 tablet, Rfl: 3 .  Cholecalciferol (VITAMIN D PO), Take by mouth. Take 5000-7000 units by mouth daily, Disp: , Rfl:  .  escitalopram (LEXAPRO) 20 MG tablet, Take 1 tablet (20 mg total) by mouth daily., Disp: 90 tablet, Rfl: 3 .  Multiple Vitamin (MULTIVITAMIN WITH MINERALS) TABS tablet, Take 1 tablet by mouth daily., Disp: , Rfl:  .  Nutritional Supplements (JUICE PLUS FIBRE) LIQD, Take 12 capsules by mouth daily. FRUITS AND VEGETABLES AND ANTI-OXIDANTS., Disp: , Rfl:  .  ocrelizumab 600 mg in sodium chloride 0.9 % 500 mL, Inject 600 mg into the vein every 6 (six) months., Disp: , Rfl:  .  Omega-3 Fatty Acids (FISH OIL) 1000 MG CAPS, Take 2 capsules by mouth daily., Disp: , Rfl:  .  UNABLE TO FIND, CBD oil 500mg  once daily, Disp: , Rfl:  No current facility-administered medications for this visit.   Facility-Administered Medications Ordered in Other Visits:  .  gadopentetate dimeglumine (MAGNEVIST) injection 17 mL, 17 mL, Intravenous, Once PRN, Anson Fret, MD  PAST MEDICAL HISTORY: Past Medical History:  Diagnosis Date  . Hypertension   . Multiple sclerosis (HCC)   . Vision abnormalities     PAST SURGICAL HISTORY: No past surgical history on file.  FAMILY HISTORY: Family History  Problem Relation Age of Onset  . Stroke Maternal Grandfather     SOCIAL HISTORY:  Social History   Social History  . Marital status: Divorced     Spouse name: N/A  . Number of children: N/A  . Years of education: N/A   Occupational History  . Not on file.   Social History Main Topics  . Smoking status: Never Smoker  . Smokeless tobacco: Never Used  . Alcohol use No  . Drug use: No  . Sexual activity: Not on file   Other Topics Concern  . Not on file   Social History Narrative  . No narrative on file     PHYSICAL EXAM  Vitals:   02/20/17 1313  BP: (!) 141/85  Pulse: 80  Weight: 178 lb (80.7 kg)  Height: 5\' 7"  (1.702 m)    Body mass index is 27.88 kg/m.   General: The patient is well-developed and well-nourished and in no acute distress.  Scrape on left elbow and knee from fall.    Neurologic Exam  Mental status: The patient is alert and oriented x 3 at the time of the examination. The patient has apparent normal recent and remote memory, with an apparently normal attention span and concentration ability.   Speech is normal.  Cranial nerves: Extraocular movements exam shows nystagmus on right gaze. Pupils show 1+ right APD.    Colors are desaturated on the right.   Facial strength and sensation is norma.    Trapezius and sternocleidomastoid strength is normal. No dysarthria is noted.  The tongue is midline, and the patient has symmetric elevation of the soft palate. No obvious hearing deficits are noted.  Motor:  Muscle bulk is normal.   Tone is increased mildly in legs and mildly in left arm. Strength is  5 / 5 in all 4 extremities except 4+/5 EHL muscle  Sensory: He has reduced left vibration sensation in the left arm and leg and reduced temperature sensation in the right leg.   Coordination: Finger-nose-finger is performed mildly reduced on the left. Heel-to-shin is performed poorly on the left.   Gait and station: Station is normal.   The gait is wide and spastic. There is a mild left foot drop. He cannot tandem walk. Romberg is positive.   Reflexes: Deep tendon reflexes are symmetric and increased in  legs,with spread at both knees (worse on left) and nonsustained clonus at left ankle.    Plantar responses is extensor on the left.     DIAGNOSTIC DATA (LABS, IMAGING, TESTING) - I reviewed patient records, labs, notes, testing and imaging myself where available.      ASSESSMENT AND PLAN  Multiple sclerosis (HCC) - Plan: Ambulatory referral to Physical Therapy  Spastic gait - Plan: Ambulatory referral to Physical Therapy  Urinary dysfunction  Tremor  Hand weakness  High risk medication use   1.   Continue ocrelizumab, next dose in August/September. 2.   Stay active.   Ok to take Beet supplement 3.   Due to his physical impairments with both hand and leg weakness, he is permanently and totally disabled.    4.   PT for gait/balance. 5.   He will return to see me in 6 months for regular visit or call sooner if there are new or worsening neurologic symptoms.  40 minutes face-to-face evaluation with greater than one half the time counseling and coordinating care about his MS and related symptoms.   Porcha Deblanc A. Epimenio Foot, MD, PhD 02/20/2017, 1:48 PM Certified in Neurology, Clinical Neurophysiology, Sleep Medicine, Pain Medicine and Neuroimaging  [facility] [address], [address] [place], [place] [address] [phone]

## 2017-02-21 LAB — COMPREHENSIVE METABOLIC PANEL
A/G RATIO: 1.6 (ref 1.2–2.2)
ALT: 19 IU/L (ref 0–44)
AST: 18 IU/L (ref 0–40)
Albumin: 4.4 g/dL (ref 3.5–5.5)
Alkaline Phosphatase: 53 IU/L (ref 39–117)
BUN/Creatinine Ratio: 13 (ref 9–20)
BUN: 15 mg/dL (ref 6–24)
Bilirubin Total: 0.8 mg/dL (ref 0.0–1.2)
CALCIUM: 9.6 mg/dL (ref 8.7–10.2)
CO2: 25 mmol/L (ref 20–29)
CREATININE: 1.13 mg/dL (ref 0.76–1.27)
Chloride: 102 mmol/L (ref 96–106)
GFR, EST AFRICAN AMERICAN: 92 mL/min/{1.73_m2} (ref >59)
GFR, EST NON AFRICAN AMERICAN: 80 mL/min/{1.73_m2} (ref >59)
GLOBULIN, TOTAL: 2.7 g/dL (ref 1.5–4.5)
Glucose: 81 mg/dL (ref 65–99)
POTASSIUM: 5.1 mmol/L (ref 3.5–5.2)
SODIUM: 141 mmol/L (ref 134–144)
TOTAL PROTEIN: 7.1 g/dL (ref 6.0–8.5)

## 2017-02-21 LAB — CBC WITH DIFFERENTIAL/PLATELET
BASOS: 2 %
Basophils Absolute: 0.1 10*3/uL (ref 0.0–0.2)
EOS (ABSOLUTE): 0.4 10*3/uL (ref 0.0–0.4)
EOS: 7 %
HEMATOCRIT: 42.8 % (ref 37.5–51.0)
Hemoglobin: 14.4 g/dL (ref 13.0–17.7)
IMMATURE GRANS (ABS): 0 10*3/uL (ref 0.0–0.1)
Immature Granulocytes: 0 %
LYMPHS: 23 %
Lymphocytes Absolute: 1.2 10*3/uL (ref 0.7–3.1)
MCH: 28 pg (ref 26.6–33.0)
MCHC: 33.6 g/dL (ref 31.5–35.7)
MCV: 83 fL (ref 79–97)
MONOCYTES: 8 %
Monocytes Absolute: 0.4 10*3/uL (ref 0.1–0.9)
Neutrophils Absolute: 3.2 10*3/uL (ref 1.4–7.0)
Neutrophils: 60 %
Platelets: 201 10*3/uL (ref 150–379)
RBC: 5.14 x10E6/uL (ref 4.14–5.80)
RDW: 14.3 % (ref 12.3–15.4)
WBC: 5.4 10*3/uL (ref 3.4–10.8)

## 2017-02-24 ENCOUNTER — Telehealth: Payer: Self-pay | Admitting: *Deleted

## 2017-02-24 NOTE — Telephone Encounter (Signed)
I have spoken with Alexander [name] this afternoon and per RAS, advised lab work done in our office is fine.  He verbalized understanding of same/fim

## 2017-02-24 NOTE — Telephone Encounter (Signed)
-----   Message from Richard A Sater, MD sent at 02/22/2017  2:35 PM EDT ----- Please let the patient know that the lab work is fine.  

## 2017-02-27 ENCOUNTER — Ambulatory Visit: Payer: BLUE CROSS/BLUE SHIELD | Admitting: Neurology

## 2017-03-03 ENCOUNTER — Telehealth: Payer: Self-pay | Admitting: Neurology

## 2017-03-03 NOTE — Telephone Encounter (Signed)
I have spoken with Yengkong this afternoon and explained that RAS is ok with taking a probiotic; no specific recommendations on brand.  Pt. verbalized understanding of same/fim

## 2017-03-03 NOTE — Telephone Encounter (Signed)
Pt would like to know if he can incorporate Pro Biotics into his diet and if so would Dr Epimenio Foot recommend a certain brand.  Please call

## 2017-04-30 ENCOUNTER — Ambulatory Visit (INDEPENDENT_AMBULATORY_CARE_PROVIDER_SITE_OTHER): Payer: BLUE CROSS/BLUE SHIELD | Admitting: *Deleted

## 2017-04-30 DIAGNOSIS — Z23 Encounter for immunization: Secondary | ICD-10-CM | POA: Diagnosis not present

## 2017-06-10 ENCOUNTER — Other Ambulatory Visit: Payer: Self-pay | Admitting: Internal Medicine

## 2017-08-25 ENCOUNTER — Ambulatory Visit: Payer: BLUE CROSS/BLUE SHIELD | Admitting: Neurology

## 2017-08-25 ENCOUNTER — Other Ambulatory Visit: Payer: Self-pay

## 2017-08-25 ENCOUNTER — Encounter: Payer: Self-pay | Admitting: Neurology

## 2017-08-25 ENCOUNTER — Encounter (INDEPENDENT_AMBULATORY_CARE_PROVIDER_SITE_OTHER): Payer: Self-pay

## 2017-08-25 VITALS — BP 137/93 | HR 80 | Resp 14 | Ht 67.0 in | Wt 178.0 lb

## 2017-08-25 DIAGNOSIS — R39198 Other difficulties with micturition: Secondary | ICD-10-CM

## 2017-08-25 DIAGNOSIS — R251 Tremor, unspecified: Secondary | ICD-10-CM

## 2017-08-25 DIAGNOSIS — R261 Paralytic gait: Secondary | ICD-10-CM | POA: Diagnosis not present

## 2017-08-25 DIAGNOSIS — Z79899 Other long term (current) drug therapy: Secondary | ICD-10-CM

## 2017-08-25 DIAGNOSIS — G35 Multiple sclerosis: Secondary | ICD-10-CM | POA: Diagnosis not present

## 2017-08-25 MED ORDER — BACLOFEN 10 MG PO TABS: ORAL_TABLET | ORAL | 3 refills | Status: DC

## 2017-08-25 NOTE — Progress Notes (Signed)
[facility]  PATIENT: Alexander [name] DOB: July 05, 1976  REFERRING DOCTOR OR PCP:  Dr. Eleonore Chiquito (PCP)  _________________________________   HISTORICAL  CHIEF COMPLAINT:  Chief Complaint  Patient presents with  . Multiple Sclerosis    Sts.  he tolerates Ocrevus well and feels stable on it.  Last infusion was 04/11/17, and next infusion is scheduled for 10/09/17./fim    HISTORY OF PRESENT ILLNESS:  Alexander [name] is a 42 year old man with multiple sclerosis diagnosed in 2003 on ocrelizumab.     Update 08/25/2017: He is scheduled for his next ocrelizumab dose 10/09/2017. He is tolerating it well and there have not been any complications. He has not noted any exacerbations and feels that his MS is stable.  One of his main issues is poor gait due to reduced balance and weakness that is worse on the left. Occasionally he will fall but no injuries. He also has reduced coordination the hands, worse on the left. For spasticity he takes baclofen with some improvement in the past (takes 3-5 pills a day), he had tried Ampyra but it did not help. He also has truncal dysesthesias which seem better with baclofen.  The dysesthesias are better with exercise also.   He does PT every 2 weeks and goes to a gym.   The hand tremors are very mild now and not too bothersome.    Zonisamide was stopped.    He has urinary frequency with some urgency but no incontinence.   He does note fatigue and sleepiness. He feels much better after he takes a nap. He is able to adapt to the fatigue and does not require medications at this time. Mood is doing well and he continues on Lexapro. He does not have any significant problems with cognition. He is sleeping well.  Follow-up 02/20/2017: MS:   He had  ocrelizumab doses in mid and late August and second infusion was in February and went well. His next one should be in August.     He denies definite exacerbation.    He feels mostly  stable.  Gait/strength/sensation: gait is poor due to poor balance and left > right weakness.     He fell to the left earlier today in the office but no LOC.   Coordination is poor in his hands, left is worse.  [name] He has spasticity in his legs.   He takes 10 mg of baclofen tid with improvement in the spasticity and tightness.  He tolerates it well.  He has hand numness but no leg numbness.     Ampyra made him feel worse so he stopped.      Truncal dyesthesia:    He has started CBD oil and feels pain is better in the flanks and he no longer has a MS hug.    He was able to cut the baclofen to just 10-20 mg daily.    Tremor: He has a tremor in his hands.  He was placed on zonisamide by his previous neurologist but it has not helped much .   The tremor is worse when he is tired and more noticeable with writing.    Bladder: He reports urinary frequency and urgency but no incontinence.    He has not had a urinary tract infections.  Vision: He has had optic neuritis in the past in both eyes.  The left eye recovered 100% but the right eyye is not at baseline..   Fatigue/sleep:   He has had a lot more  fatigue that is much worse in the hot weather.   He had one episode where he got overheated and legs were much weaker all day.   He generally sleeps well at night.  Mood/cognition: He denies any significant issues with depression or anxiety. He is on Lexapro.  He does not have major problems with cognition though he will have a little bit of mental fog at times. This was best on Tysabri but is better on ocrelizumab than on Gilenya.  MS history: In 2003 he had optic neuritis started on the left and then also on the right. He had an MRI of the brain performed at that time and was told that it was normal. He had follow-up MRIs every year or 2 for several more years but they continue to be normal. In 2012, he noticed worsening issues with gait. He had had some difficulty with the gait for the previous couple of years  but due to orthopedic issues a neurologic cause was not looked into. An MRI of the cervical spine showed multiple plaques consistent with MS. Initially, he was placed on Copaxone. Although he tolerated Copaxone well he had some breakthrough disease with a new spot developing in the brain. Therefore he was switched to Tysabri. He tolerated Tysabri well and his MS was well controlled. However, she was JCV antibody positive a decision was made to stop Tysabri after 2 years. Years ago he went on Gilenya. He has tolerated it well and he did better on Tysabri. He was seeing Dr. Carroll Sage at [facility] on [address]. He had Ocrevus August 2017.      REVIEW OF SYSTEMS: Constitutional: No fevers, chills, sweats, or change in appetite.   Reports minimal fatigue. Eyes: No visual changes, double vision, eye pain Ear, nose and throat: No hearing loss, ear pain, nasal congestion, sore throat Cardiovascular: No chest pain, palpitations Respiratory: No shortness of breath at rest or with exertion.   No wheezes GastrointestinaI: No nausea, vomiting, diarrhea, abdominal pain, fecal incontinence Genitourinary: reports frequency, hesitancy and nocturia. Musculoskeletal: No neck pain, back pain Integumentary: No rash, pruritus, skin lesions Neurological: as above Psychiatric: No depression at this time.  No anxiety Endocrine: No palpitations, diaphoresis, change in appetite, change in weigh or increased thirst Hematologic/Lymphatic: No anemia, purpura, petechiae. Allergic/Immunologic: No itchy/runny eyes, nasal congestion, recent allergic reactions, rashes  ALLERGIES: No Known Allergies  HOME MEDICATIONS:  Current Outpatient Medications:  .  baclofen (LIORESAL) 10 MG tablet, Take 1 tablet (10 mg total) by mouth 3 (three) times daily., Disp: 270 tablet, Rfl: 3 .  Cholecalciferol (VITAMIN D PO), Take by mouth. Take 5000-7000 units by mouth daily, Disp: , Rfl:  .  escitalopram (LEXAPRO) 20  MG tablet, Take 1 tablet (20 mg total) by mouth daily., Disp: 90 tablet, Rfl: 3 .  Multiple Vitamin (MULTIVITAMIN WITH MINERALS) TABS tablet, Take 1 tablet by mouth daily., Disp: , Rfl:  .  Nutritional Supplements (JUICE PLUS FIBRE) LIQD, Take 12 capsules by mouth daily. FRUITS AND VEGETABLES AND ANTI-OXIDANTS., Disp: , Rfl:  .  ocrelizumab 600 mg in sodium chloride 0.9 % 500 mL, Inject 600 mg into the vein every 6 (six) months., Disp: , Rfl:  .  Omega-3 Fatty Acids (FISH OIL) 1000 MG CAPS, Take 2 capsules by mouth daily., Disp: , Rfl:  .  UNABLE TO FIND, CBD oil 500mg  once daily, Disp: , Rfl:  No current facility-administered medications for this visit.   Facility-Administered Medications Ordered in Other Visits:  .  gadopentetate dimeglumine (MAGNEVIST) injection 17 mL, 17 mL, Intravenous, Once PRN, Anson Fret, MD  PAST MEDICAL HISTORY: Past Medical History:  Diagnosis Date  . Hypertension   . Multiple sclerosis (HCC)   . Vision abnormalities     PAST SURGICAL HISTORY: History reviewed. No pertinent surgical history.  FAMILY HISTORY: Family History  Problem Relation Age of Onset  . Stroke Maternal Grandfather     SOCIAL HISTORY:  Social History   Socioeconomic History  . Marital status: Divorced    Spouse name: Not on file  . Number of children: Not on file  . Years of education: Not on file  . Highest education level: Not on file  Social Needs  . Financial resource strain: Not on file  . Food insecurity - worry: Not on file  . Food insecurity - inability: Not on file  . Transportation needs - medical: Not on file  . Transportation needs - non-medical: Not on file  Occupational History  . Not on file  Tobacco Use  . Smoking status: Never Smoker  . Smokeless tobacco: Never Used  Substance and Sexual Activity  . Alcohol use: No  . Drug use: No  . Sexual activity: Not on file  Other Topics Concern  . Not on file  Social History Narrative  . Not on file      PHYSICAL EXAM  Vitals:   08/25/17 1042  BP: (!) 137/93  Pulse: 80  Resp: 14  Weight: 178 lb (80.7 kg)  Height: 5\' 7"  (1.702 m)    Body mass index is 27.88 kg/m.   General: The patient is well-developed and well-nourished and in no acute distress.   Scrape on left elbow and knee from fall.    Neurologic Exam  Mental status: The patient is alert and oriented x 3 at the time of the examination. The patient has apparent normal recent and remote memory, with an apparently normal attention span and concentration ability.   Speech is normal.  Cranial nerves: Extraocular movements exam shows nystagmus on right gaze. Pupils show 1+ right APD colors are desaturated on the right. Facial strength and sensation and trapezius strength were normal.   The tongue is midline, and the patient has symmetric elevation of the soft palate. No obvious hearing deficits are noted.  Motor:  Muscle bulk is normal.   Muscle tone is increased in the legs, left leg > the right and slightly in the left arm. He has 4+/5 strength in the left leg and 5 minus/5 strength in the right leg. Rapid alternating movements are slow with the left arm and leg  Sensory: He has reduced left vibration sensation in the left arm and leg and reduced temperature sensation in the right leg.   Coordination: Finger-nose-finger is performed mildly reduced on the left. Heel-to-shin is performed poorly on the left.   Gait and station: Station is normal.   The gait is wide and spastic. There is a mild left foot drop. He cannot tandem walk. Romberg is positive.   Reflexes: Deep tendon reflexes are symmetric and increased in legs,with spread at both knees (worse on left) and nonsustained clonus at left ankle.    Plantar responses is extensor on the left.     DIAGNOSTIC DATA (LABS, IMAGING, TESTING) - I reviewed patient records, labs, notes, testing and imaging myself where available.      ASSESSMENT AND PLAN  Multiple  sclerosis (HCC)  Urinary dysfunction  Spastic gait  Tremor  High risk medication  use   1.   Continue ocrelizumab, next dose in March. 2.   He will stay active with occasional physical therapy and visits to the gym.    3.   Due to his physical impairments with both hand and leg weakness, he is permanently and totally disabled.    4.    He will return to see me in 6 months for a regular visit or call sooner if there are new or worsening neurologic symptoms.    Kalima Saylor A. Epimenio Foot, MD, PhD 08/25/2017, 11:08 AM Certified in Neurology, Clinical Neurophysiology, Sleep Medicine, Pain Medicine and Neuroimaging  [facility] [address], [address] [place], [place] [address] [phone]

## 2017-08-26 ENCOUNTER — Telehealth: Payer: Self-pay | Admitting: *Deleted

## 2017-08-26 LAB — CBC WITH DIFFERENTIAL/PLATELET
BASOS: 2 %
Basophils Absolute: 0.1 10*3/uL (ref 0.0–0.2)
EOS (ABSOLUTE): 0.3 10*3/uL (ref 0.0–0.4)
EOS: 8 %
HEMATOCRIT: 43.6 % (ref 37.5–51.0)
HEMOGLOBIN: 15 g/dL (ref 13.0–17.7)
IMMATURE GRANS (ABS): 0 10*3/uL (ref 0.0–0.1)
IMMATURE GRANULOCYTES: 0 %
LYMPHS: 14 %
Lymphocytes Absolute: 0.6 10*3/uL — ABNORMAL LOW (ref 0.7–3.1)
MCH: 27.7 pg (ref 26.6–33.0)
MCHC: 34.4 g/dL (ref 31.5–35.7)
MCV: 80 fL (ref 79–97)
Monocytes Absolute: 0.5 10*3/uL (ref 0.1–0.9)
Monocytes: 11 %
NEUTROS ABS: 3 10*3/uL (ref 1.4–7.0)
Neutrophils: 65 %
Platelets: 184 10*3/uL (ref 150–379)
RBC: 5.42 x10E6/uL (ref 4.14–5.80)
RDW: 14.6 % (ref 12.3–15.4)
WBC: 4.6 10*3/uL (ref 3.4–10.8)

## 2017-08-26 LAB — IGG, IGA, IGM
IGA/IMMUNOGLOBULIN A, SERUM: 300 mg/dL (ref 90–386)
IgG (Immunoglobin G), Serum: 885 mg/dL (ref 700–1600)
IgM (Immunoglobulin M), Srm: 46 mg/dL (ref 20–172)

## 2017-08-26 NOTE — Telephone Encounter (Signed)
-----   Message from Asa Lente, MD sent at 08/26/2017  3:04 PM EST ----- Please let the patient know that the lab work is fine (lymphocytes slightly low but that can be seen with his medication)

## 2017-08-26 NOTE — Telephone Encounter (Signed)
Spoke with Viviann Spare and reviewed below lab results.  He verbalized understanding of same/fim

## 2017-10-07 ENCOUNTER — Other Ambulatory Visit: Payer: Self-pay | Admitting: Internal Medicine

## 2017-10-09 ENCOUNTER — Telehealth: Payer: Self-pay | Admitting: Neurology

## 2017-10-09 MED ORDER — ESCITALOPRAM OXALATE 20 MG PO TABS: 20.0000 mg | ORAL_TABLET | Freq: Every day | ORAL | 3 refills | Status: DC

## 2017-10-09 NOTE — Telephone Encounter (Signed)
Lexapro escribed as requested/fim

## 2017-10-09 NOTE — Telephone Encounter (Signed)
Pt is asking for a refill of escitalopram (LEXAPRO) 20 MG tablet [id number] - [place], [place] - [address] [facility] [address] [phone] (Phone) [phone] (Fax)

## 2017-10-09 NOTE — Addendum Note (Signed)
Addended by: Candis Schatz I on: 10/09/2017 04:30 PM   Modules accepted: Orders

## 2017-10-09 NOTE — Telephone Encounter (Signed)
Spoke to patient and patient said that the request was suppose to have been to his other provider. No further action needed

## 2017-11-30 ENCOUNTER — Other Ambulatory Visit: Payer: Self-pay

## 2017-11-30 ENCOUNTER — Encounter (HOSPITAL_COMMUNITY): Payer: Self-pay | Admitting: Emergency Medicine

## 2017-11-30 ENCOUNTER — Emergency Department (HOSPITAL_COMMUNITY): Payer: BLUE CROSS/BLUE SHIELD

## 2017-11-30 ENCOUNTER — Emergency Department (HOSPITAL_COMMUNITY)
Admission: EM | Admit: 2017-11-30 | Discharge: 2017-11-30 | Disposition: A | Discharge status: Home / Self Care | Payer: BLUE CROSS/BLUE SHIELD | Attending: Emergency Medicine | Admitting: Emergency Medicine

## 2017-11-30 DIAGNOSIS — I10 Essential (primary) hypertension: Secondary | ICD-10-CM | POA: Diagnosis not present

## 2017-11-30 DIAGNOSIS — Z79899 Other long term (current) drug therapy: Secondary | ICD-10-CM | POA: Insufficient documentation

## 2017-11-30 DIAGNOSIS — Y999 Unspecified external cause status: Secondary | ICD-10-CM | POA: Insufficient documentation

## 2017-11-30 DIAGNOSIS — W01198A Fall on same level from slipping, tripping and stumbling with subsequent striking against other object, initial encounter: Secondary | ICD-10-CM | POA: Diagnosis not present

## 2017-11-30 DIAGNOSIS — S0990XA Unspecified injury of head, initial encounter: Secondary | ICD-10-CM | POA: Diagnosis not present

## 2017-11-30 DIAGNOSIS — G35 Multiple sclerosis: Secondary | ICD-10-CM | POA: Insufficient documentation

## 2017-11-30 DIAGNOSIS — Y939 Activity, unspecified: Secondary | ICD-10-CM | POA: Diagnosis not present

## 2017-11-30 DIAGNOSIS — Y929 Unspecified place or not applicable: Secondary | ICD-10-CM | POA: Diagnosis not present

## 2017-11-30 IMAGING — CT CT CERVICAL SPINE W/O CM
4 of 7 series · 15 of 33 positions shown, 16 images · non-contrast
Comparison: None.

CLINICAL DATA: Patient has multiple sclerosis and fell today
striking head on concrete. No loss of consciousness. Abrasion to
back of head.

EXAM:
CT HEAD WITHOUT CONTRAST; CT CERVICAL SPINE WITHOUT CONTRAST
TECHNIQUE: Contiguous axial images were obtained from the base of the skull
through the vertex without intravenous contrast.

[Series 8: c spine soft · axial · 0.28mm/px · z∈[+1231,+1347]mm · 4 of 98 slices shown]
[im 20/98  soft-tissue]
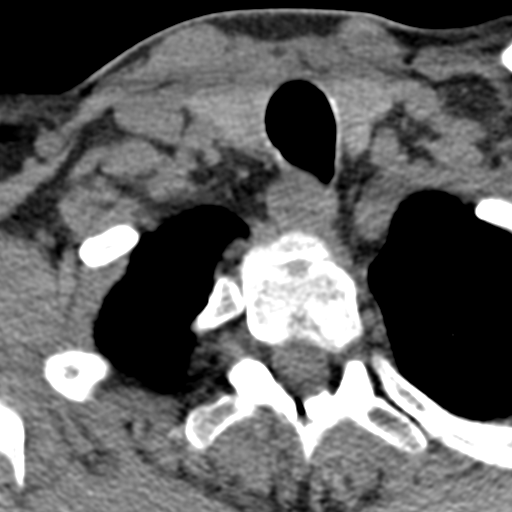
[im 39/98  soft-tissue]
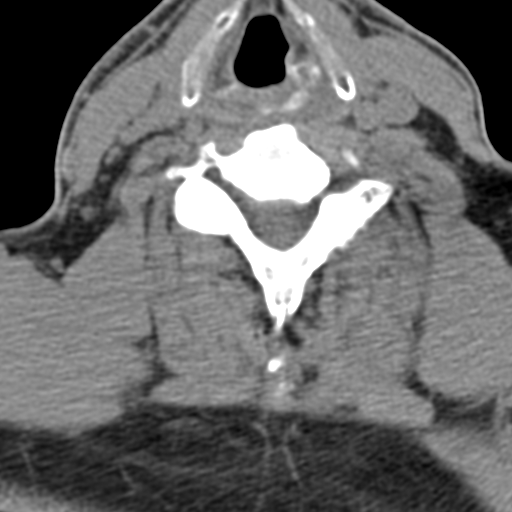
[im 59/98  soft-tissue]
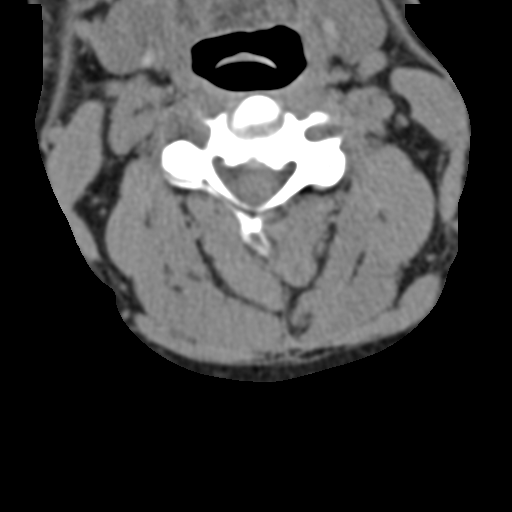
[im 78/98  soft-tissue]
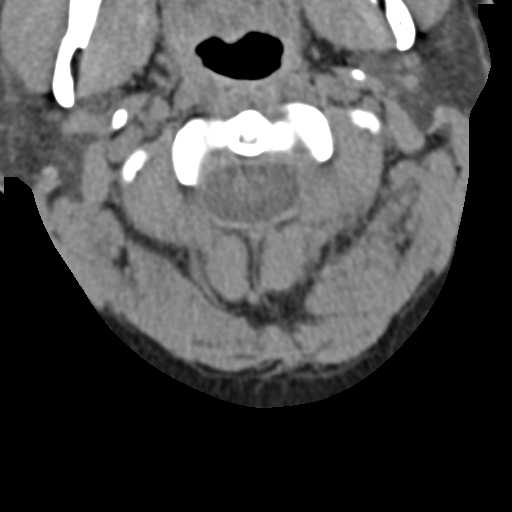

[Series 9: orthogonal bone · axial · 0.23mm/px · z∈[+1208,+1326]mm · 4 of 108 slices shown, 5 images]
[im 22/108  soft-tissue]
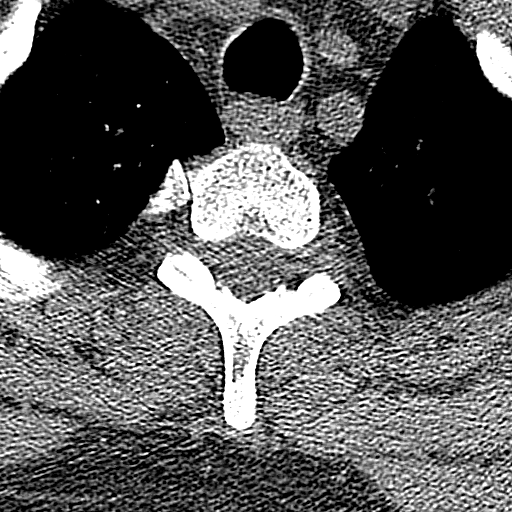
[im 22/108  bone]
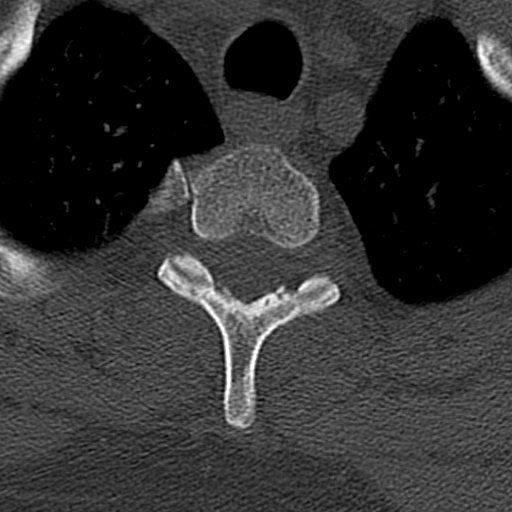
[im 43/108  bone]
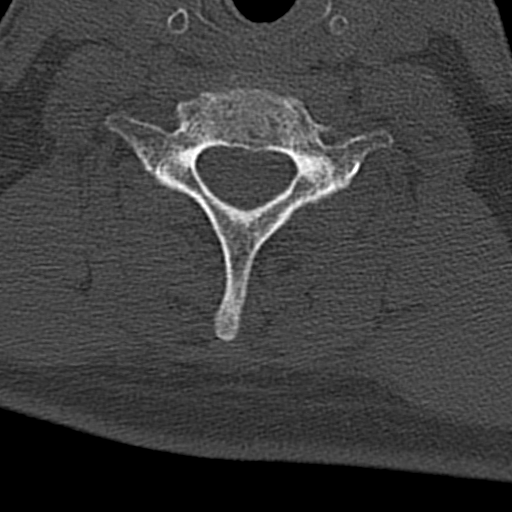
[im 65/108  bone]
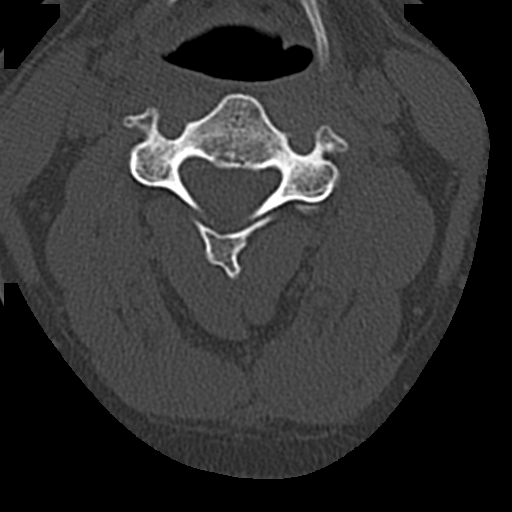
[im 86/108  bone]
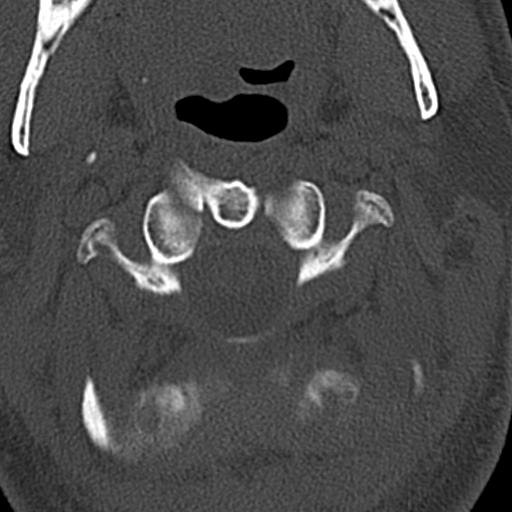

[Series 10: coronal bone · coronal · 0.29mm/px · 3 of 69 slices shown]
[im 18/69  bone]
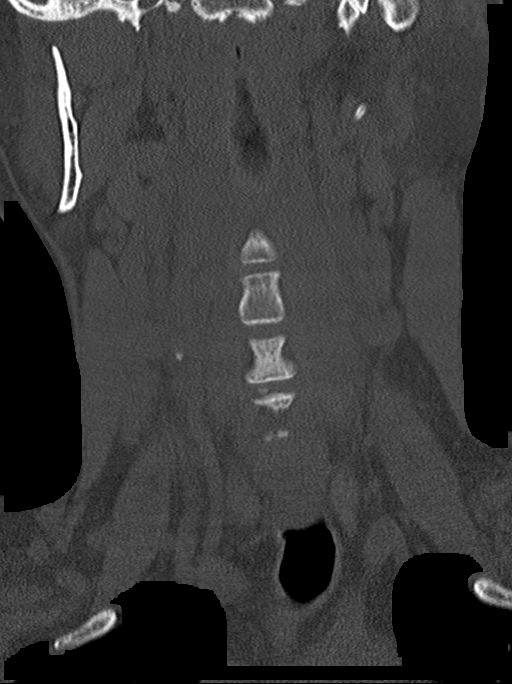
[im 35/69  bone]
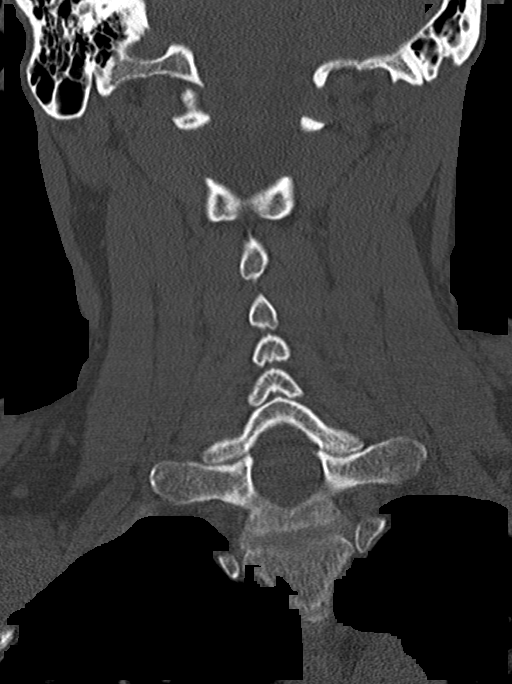
[im 52/69  bone]
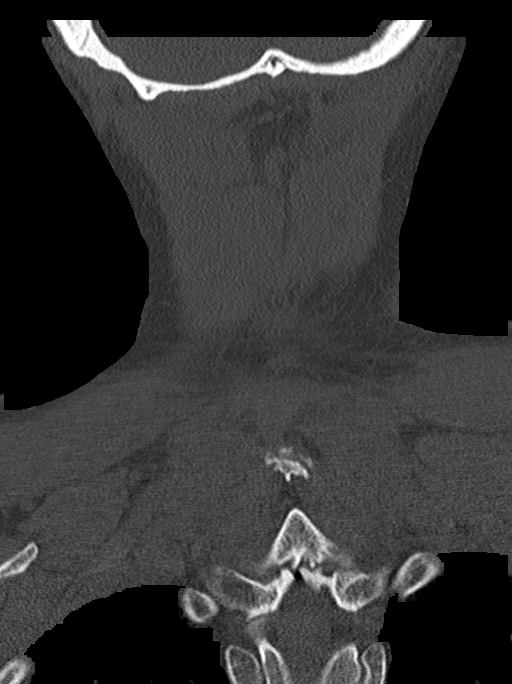

[Series 11: sagittal bone · sagittal · 0.32mm/px · 4 of 61 slices shown]
[im 13/61  bone]
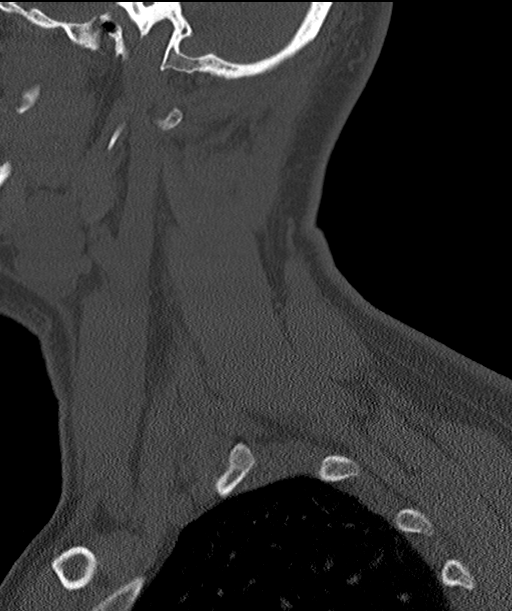
[im 25/61  bone]
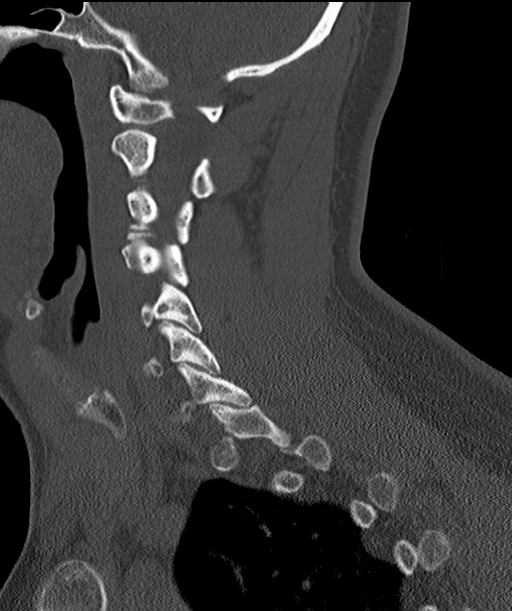
[im 37/61  bone]
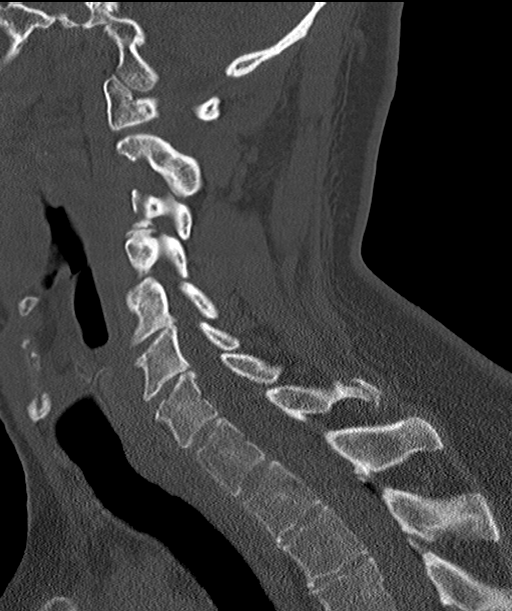
[im 49/61  bone]
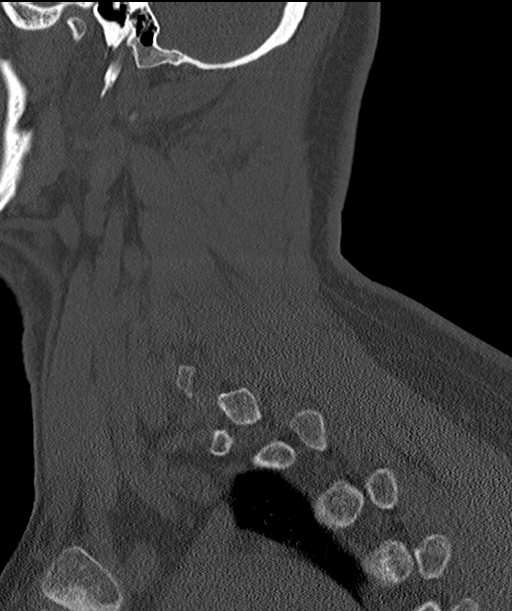

[15 of 33 positions shown; findings below may reference images not displayed]

FINDINGS: CT HEAD FINDINGS

BRAIN: The ventricles and sulci are normal. No intraparenchymal
hemorrhage, mass effect nor midline shift. No acute large vascular
territory infarcts. No abnormal extra-axial fluid collections. Basal
cisterns are midline and not effaced. No acute cerebellar
abnormality.

VASCULAR: Unremarkable.

SKULL/SOFT TISSUES: No skull fracture. Minimal left posterior
parietal scalp contusion.

ORBITS/SINUSES: The included ocular globes and orbital contents are
normal.The mastoid air-cells and included paranasal sinuses are
well-aerated.

OTHER: Mild levoconvex curvature of the nasal septum with left-sided
nasal septal spur.

CT CERVICAL SPINE FINDINGS

ALIGNMENT: Vertebral bodies in alignment. Maintained lordosis.

SKULL BASE AND VERTEBRAE: Cervical vertebral bodies and posterior
elements are intact. Intervertebral disc heights preserved. No
destructive bony lesions. C1-2 articulation maintained.

SOFT TISSUES AND SPINAL CANAL: Normal.

DISC LEVELS: No significant osseous canal stenosis or neural
foraminal narrowing. Mild degenerative disc flattening C2 through C4
and C5 through C7. Small posterior marginal osteophytes are present
without significant canal stenosis. Uncovertebral joint
osteoarthritis with uncinate spurring contributing to mild C3-4
bilateral foraminal encroachment is identified. Lesser degree of
uncinate spurring noted at C5-6 bilaterally without significant
foraminal narrowing. Facet joints are aligned without jumped or
perched facets. Nuchal ligament ossification noted posteriorly at
T1.

UPPER CHEST: Lung apices are clear.

OTHER: None.
IMPRESSION: Mild left posterior parietal scalp contusion. No acute intracranial
abnormality.

No acute cervical spine fracture or posttraumatic listhesis. Mild
multilevel degenerative disc disease and uncovertebral joint
osteoarthritis as above.

## 2017-11-30 NOTE — ED Provider Notes (Signed)
[facility]-EMERGENCY DEPT Provider Note   CSN: [id number] Arrival date & time: 11/30/17  1924     History   Chief Complaint Chief Complaint  Patient presents with  . Fall  . Head Injury    HPI Alexander [name] is a 42 y.o. male.  Patient states he slipped and fell and hit his head hard.  Patient states it he did not lose consciousness but he had significant pain in the occipital area.  The history is provided by the patient. No language interpreter was used.  Fall  This is a new problem. The current episode started 3 to 5 hours ago. The problem occurs rarely. The problem has been resolved. Associated symptoms include headaches. Pertinent negatives include no chest pain and no abdominal pain. Nothing aggravates the symptoms. Nothing relieves the symptoms. He has tried nothing for the symptoms. The treatment provided no relief.  Head Injury      Past Medical History:  Diagnosis Date  . Hypertension   . Multiple sclerosis (HCC)   . Vision abnormalities     Patient Active Problem List   Diagnosis Date Noted  . High risk medication use 09/25/2016  . Hand weakness 07/10/2016  . Spastic gait 03/07/2016  . Multiple sclerosis (HCC) 03/07/2016  . Urinary dysfunction 03/07/2016  . Tremor 03/07/2016  . Coccygeal contusion 10/15/2013    Past Surgical History:  Procedure Laterality Date  . ACHILLES TENDON SURGERY    . ANTERIOR CRUCIATE LIGAMENT REPAIR    . MENISCUS REPAIR          Home Medications    Prior to Admission medications   Medication Sig Start Date End Date Taking? Authorizing Provider  baclofen (LIORESAL) 10 MG tablet Take 4 to 5 tablets a day for spasticity 08/25/17   Sater, Pearletha Furl, MD  Cholecalciferol (VITAMIN D PO) Take by mouth. Take 5000-7000 units by mouth daily    [provider]  escitalopram (LEXAPRO) 20 MG tablet Take 1 tablet (20 mg total) by mouth daily. 10/09/17   Sater, Pearletha Furl, MD  Multiple Vitamin (MULTIVITAMIN  WITH MINERALS) TABS tablet Take 1 tablet by mouth daily.    [provider]  Nutritional Supplements (JUICE PLUS FIBRE) LIQD Take 12 capsules by mouth daily. FRUITS AND VEGETABLES AND ANTI-OXIDANTS.    [provider]  ocrelizumab 600 mg in sodium chloride 0.9 % 500 mL Inject 600 mg into the vein every 6 (six) months.    [provider]  Omega-3 Fatty Acids (FISH OIL) 1000 MG CAPS Take 2 capsules by mouth daily.    [provider]  UNABLE TO FIND CBD oil 500mg  once daily    [provider]    Family History Family History  Problem Relation Age of Onset  . Congestive Heart Failure Father   . Stroke Maternal Grandfather     Social History Social History   Tobacco Use  . Smoking status: Never Smoker  . Smokeless tobacco: Never Used  Substance Use Topics  . Alcohol use: No  . Drug use: No     Allergies   Patient has no known allergies.   Review of Systems Review of Systems  Constitutional: Negative for appetite change and fatigue.  HENT: Negative for congestion, ear discharge and sinus pressure.   Eyes: Negative for discharge.  Respiratory: Negative for cough.   Cardiovascular: Negative for chest pain.  Gastrointestinal: Negative for abdominal pain and diarrhea.  Genitourinary: Negative for frequency and hematuria.  Musculoskeletal: Negative for  back pain.  Skin: Negative for rash.  Neurological: Positive for headaches. Negative for seizures.  Psychiatric/Behavioral: Negative for hallucinations.     Physical Exam Updated Vital Signs BP (!) 161/95 (BP Location: Left Arm)   Pulse 89   Temp 98.7 F (37.1 C) (Oral)   Resp 18   SpO2 99%   Physical Exam  Constitutional: He is oriented to person, place, and time. He appears well-developed.  HENT:  Head: Normocephalic.  Moderate tenderness to posterior head.  Mild neck tenderness  Eyes: Conjunctivae and EOM are normal. No scleral icterus.  Neck: Neck supple. No thyromegaly  present.  Cardiovascular: Normal rate and regular rhythm. Exam reveals no gallop and no friction rub.  No murmur heard. Pulmonary/Chest: No stridor. He has no wheezes. He has no rales. He exhibits no tenderness.  Abdominal: He exhibits no distension. There is no tenderness. There is no rebound.  Musculoskeletal: Normal range of motion. He exhibits no edema.  Lymphadenopathy:    He has no cervical adenopathy.  Neurological: He is oriented to person, place, and time. He exhibits normal muscle tone. Coordination normal.  Skin: No rash noted. No erythema.  Psychiatric: He has a normal mood and affect. His behavior is normal.     ED Treatments / Results  Labs (all labs ordered are listed, but only abnormal results are displayed) Labs Reviewed - No data to display  EKG None  Radiology Ct Head Wo Contrast  Result Date: 11/30/2017 CLINICAL DATA:  Patient has multiple sclerosis and fell today striking head on concrete. No loss of consciousness. Abrasion to back of head. EXAM: CT HEAD WITHOUT CONTRAST; CT CERVICAL SPINE WITHOUT CONTRAST TECHNIQUE: Contiguous axial images were obtained from the base of the skull through the vertex without intravenous contrast. COMPARISON:  None. FINDINGS: CT HEAD FINDINGS BRAIN: The ventricles and sulci are normal. No intraparenchymal hemorrhage, mass effect nor midline shift. No acute large vascular territory infarcts. No abnormal extra-axial fluid collections. Basal cisterns are midline and not effaced. No acute cerebellar abnormality. VASCULAR: Unremarkable. SKULL/SOFT TISSUES: No skull fracture. Minimal left posterior parietal scalp contusion. ORBITS/SINUSES: The included ocular globes and orbital contents are normal.The mastoid air-cells and included paranasal sinuses are well-aerated. OTHER: Mild levoconvex curvature of the nasal septum with left-sided nasal septal spur. CT CERVICAL SPINE FINDINGS ALIGNMENT: Vertebral bodies in alignment. Maintained lordosis.  SKULL BASE AND VERTEBRAE: Cervical vertebral bodies and posterior elements are intact. Intervertebral disc heights preserved. No destructive bony lesions. C1-2 articulation maintained. SOFT TISSUES AND SPINAL CANAL: Normal. DISC LEVELS: No significant osseous canal stenosis or neural foraminal narrowing. Mild degenerative disc flattening C2 through C4 and C5 through C7. Small posterior marginal osteophytes are present without significant canal stenosis. Uncovertebral joint osteoarthritis with uncinate spurring contributing to mild C3-4 bilateral foraminal encroachment is identified. Lesser degree of uncinate spurring noted at C5-6 bilaterally without significant foraminal narrowing. Facet joints are aligned without jumped or perched facets. Nuchal ligament ossification noted posteriorly at T1. UPPER CHEST: Lung apices are clear. OTHER: None. IMPRESSION: Mild left posterior parietal scalp contusion. No acute intracranial abnormality. No acute cervical spine fracture or posttraumatic listhesis. Mild multilevel degenerative disc disease and uncovertebral joint osteoarthritis as above. Electronically Signed   By: Tollie Eth M.D.   On: 11/30/2017 22:30   Ct Cervical Spine Wo Contrast  Result Date: 11/30/2017 CLINICAL DATA:  Patient has multiple sclerosis and fell today striking head on concrete. No loss of consciousness. Abrasion to back of head. EXAM: CT HEAD WITHOUT CONTRAST; CT  CERVICAL SPINE WITHOUT CONTRAST TECHNIQUE: Contiguous axial images were obtained from the base of the skull through the vertex without intravenous contrast. COMPARISON:  None. FINDINGS: CT HEAD FINDINGS BRAIN: The ventricles and sulci are normal. No intraparenchymal hemorrhage, mass effect nor midline shift. No acute large vascular territory infarcts. No abnormal extra-axial fluid collections. Basal cisterns are midline and not effaced. No acute cerebellar abnormality. VASCULAR: Unremarkable. SKULL/SOFT TISSUES: No skull fracture. Minimal  left posterior parietal scalp contusion. ORBITS/SINUSES: The included ocular globes and orbital contents are normal.The mastoid air-cells and included paranasal sinuses are well-aerated. OTHER: Mild levoconvex curvature of the nasal septum with left-sided nasal septal spur. CT CERVICAL SPINE FINDINGS ALIGNMENT: Vertebral bodies in alignment. Maintained lordosis. SKULL BASE AND VERTEBRAE: Cervical vertebral bodies and posterior elements are intact. Intervertebral disc heights preserved. No destructive bony lesions. C1-2 articulation maintained. SOFT TISSUES AND SPINAL CANAL: Normal. DISC LEVELS: No significant osseous canal stenosis or neural foraminal narrowing. Mild degenerative disc flattening C2 through C4 and C5 through C7. Small posterior marginal osteophytes are present without significant canal stenosis. Uncovertebral joint osteoarthritis with uncinate spurring contributing to mild C3-4 bilateral foraminal encroachment is identified. Lesser degree of uncinate spurring noted at C5-6 bilaterally without significant foraminal narrowing. Facet joints are aligned without jumped or perched facets. Nuchal ligament ossification noted posteriorly at T1. UPPER CHEST: Lung apices are clear. OTHER: None. IMPRESSION: Mild left posterior parietal scalp contusion. No acute intracranial abnormality. No acute cervical spine fracture or posttraumatic listhesis. Mild multilevel degenerative disc disease and uncovertebral joint osteoarthritis as above. Electronically Signed   By: Tollie Eth M.D.   On: 11/30/2017 22:30    Procedures Procedures (including critical care time)  Medications Ordered in ED Medications - No data to display   Initial Impression / Assessment and Plan / ED Course  I have reviewed the triage vital signs and the nursing notes.  Pertinent labs & imaging results that were available during my care of the patient were reviewed by me and considered in my medical decision making (see chart for  details).     CT scan of the head neck showed no significant trauma.  Patient will take Tylenol Motrin for pain follow-up as needed  Final Clinical Impressions(s) / ED Diagnoses   Final diagnoses:  Injury of head, initial encounter    ED Discharge Orders    None       Bethann Berkshire, MD 11/30/17 2253

## 2017-11-30 NOTE — Discharge Instructions (Addendum)
Take Tylenol or Motrin for pain.  Follow-up with your family doctor if any problems 

## 2017-11-30 NOTE — ED Notes (Signed)
Patient transported to CT 

## 2017-11-30 NOTE — ED Triage Notes (Signed)
Pt has MS and fell today  Pt states he fell backward and struck his head on the concrete  Denies LOC  Pt states it rattled his teeth though  Pt has an abrasion on the back of his head

## 2017-12-03 ENCOUNTER — Encounter: Payer: Self-pay | Admitting: Internal Medicine

## 2017-12-03 ENCOUNTER — Ambulatory Visit (INDEPENDENT_AMBULATORY_CARE_PROVIDER_SITE_OTHER): Payer: BLUE CROSS/BLUE SHIELD | Admitting: Internal Medicine

## 2017-12-03 VITALS — BP 120/80 | HR 93 | Temp 98.1°F | Wt 166.0 lb

## 2017-12-03 DIAGNOSIS — Z0000 Encounter for general adult medical examination without abnormal findings: Secondary | ICD-10-CM

## 2017-12-03 NOTE — Patient Instructions (Signed)
Neurology follow-up as scheduled  Return here in 1 year or as needed

## 2017-12-03 NOTE — Progress Notes (Signed)
Subjective:    Patient ID: Alexander [name], male    DOB: 1975-08-06, 42 y.o.   MRN: [id number]  HPI  42 year old patient who is followed closely by neurology with a history of multiple sclerosis.  He is seen today for a annual health examination. He feels that he has been fairly stable although has noted slow decline in his functional status.  He uses a cane due to a spastic gait.  Social history divorced has a 4 year old daughter.  Lives with his parents  Family history- father history of hypertension, colonic polyps and thrombocytopenia.  May have a component of ITP as well as early cirrhosis.  Status post CABG Mother history of hypertension, history Graves' disease allergic rhinitis One sister is in good health   Past Medical History:  Diagnosis Date  . Hypertension   . Multiple sclerosis (HCC)   . Vision abnormalities      Social History   Socioeconomic History  . Marital status: Divorced    Spouse name: Not on file  . Number of children: Not on file  . Years of education: Not on file  . Highest education level: Not on file  Occupational History  . Not on file  Social Needs  . Financial resource strain: Not on file  . Food insecurity:    Worry: Not on file    Inability: Not on file  . Transportation needs:    Medical: Not on file    Non-medical: Not on file  Tobacco Use  . Smoking status: Never Smoker  . Smokeless tobacco: Never Used  Substance and Sexual Activity  . Alcohol use: No  . Drug use: No  . Sexual activity: Not on file  Lifestyle  . Physical activity:    Days per week: Not on file    Minutes per session: Not on file  . Stress: Not on file  Relationships  . Social connections:    Talks on phone: Not on file    Gets together: Not on file    Attends religious service: Not on file    Active member of club or organization: Not on file    Attends meetings of clubs or organizations: Not on file    Relationship status: Not on file  . Intimate  partner violence:    Fear of current or ex partner: Not on file    Emotionally abused: Not on file    Physically abused: Not on file    Forced sexual activity: Not on file  Other Topics Concern  . Not on file  Social History Narrative  . Not on file    Past Surgical History:  Procedure Laterality Date  . ACHILLES TENDON SURGERY    . ANTERIOR CRUCIATE LIGAMENT REPAIR    . MENISCUS REPAIR      Family History  Problem Relation Age of Onset  . Congestive Heart Failure Father   . Stroke Maternal Grandfather     No Known Allergies  Current Outpatient Medications on File Prior to Visit  Medication Sig Dispense Refill  . baclofen (LIORESAL) 10 MG tablet Take 4 to 5 tablets a day for spasticity 450 tablet 3  . Cholecalciferol (VITAMIN D PO) Take by mouth. Take 5000-7000 units by mouth daily    . escitalopram (LEXAPRO) 20 MG tablet Take 1 tablet (20 mg total) by mouth daily. 90 tablet 3  . Multiple Vitamin (MULTIVITAMIN WITH MINERALS) TABS tablet Take 1 tablet by mouth daily.    . Nutritional Supplements (JUICE PLUS  FIBRE) LIQD Take 12 capsules by mouth daily. FRUITS AND VEGETABLES AND ANTI-OXIDANTS.    [name] ocrelizumab 600 mg in sodium chloride 0.9 % 500 mL Inject 600 mg into the vein every 6 (six) months.    . Omega-3 Fatty Acids (FISH OIL) 1000 MG CAPS Take 2 capsules by mouth daily.    [name] UNABLE TO FIND CBD oil 500mg  once daily     Current Facility-Administered Medications on File Prior to Visit  Medication Dose Route Frequency Provider Last Rate Last Dose  . gadopentetate dimeglumine (MAGNEVIST) injection 17 mL  17 mL Intravenous Once PRN Anson Fret, MD        BP 120/80 (BP Location: Right Arm, Patient Position: Sitting, Cuff Size: Large)   Pulse 93   Temp 98.1 F (36.7 C) (Oral)   Wt 166 lb (75.3 kg)   SpO2 97%   BMI 26.00 kg/m     Review of Systems  Constitutional: Negative for appetite change, chills, fatigue and fever.  HENT: Negative for congestion, dental  problem, ear pain, hearing loss, sore throat, tinnitus, trouble swallowing and voice change.   Eyes: Negative for pain, discharge and visual disturbance.  Respiratory: Negative for cough, chest tightness, wheezing and stridor.   Cardiovascular: Negative for chest pain, palpitations and leg swelling.  Gastrointestinal: Negative for abdominal distention, abdominal pain, blood in stool, constipation, diarrhea, nausea and vomiting.  Genitourinary: Negative for difficulty urinating, discharge, flank pain, genital sores, hematuria and urgency.  Musculoskeletal: Positive for gait problem. Negative for arthralgias, back pain, joint swelling, myalgias and neck stiffness.  Skin: Negative for rash.  Neurological: Positive for tremors and weakness. Negative for dizziness, syncope, speech difficulty, numbness and headaches.  Hematological: Negative for adenopathy. Does not bruise/bleed easily.  Psychiatric/Behavioral: Negative for behavioral problems and dysphoric mood. The patient is not nervous/anxious.        Objective:   Physical Exam  Constitutional: He is oriented to person, place, and time. He appears well-developed.  HENT:  Head: Normocephalic.  Right Ear: External ear normal.  Left Ear: External ear normal.  Eyes: Conjunctivae and EOM are normal.  Neck: Normal range of motion.  Cardiovascular: Normal rate and normal heart sounds.  Pulmonary/Chest: Breath sounds normal.  Abdominal: Bowel sounds are normal.  Musculoskeletal: Normal range of motion. He exhibits no edema or tenderness.  Lower extremity spasticity Walks with a cane Left foot drop   Neurological: He is alert and oriented to person, place, and time.  Psychiatric: He has a normal mood and affect. His behavior is normal.          Assessment & Plan:  Multiple sclerosis with spastic gait History of optic neuritis.  Follow-up ophthalmology encouraged  Neurology follow-up Will check updated lab  [facility]

## 2017-12-04 LAB — LIPID PANEL
CHOL/HDL RATIO: 6
CHOLESTEROL: 236 mg/dL — AB (ref 0–200)
HDL: 36.8 mg/dL — ABNORMAL LOW (ref >39.00)
NonHDL: 199.63
Triglycerides: 234 mg/dL — ABNORMAL HIGH (ref 0.0–149.0)
VLDL: 46.8 mg/dL — AB (ref 0.0–40.0)

## 2017-12-04 LAB — TESTOSTERONE: Testosterone: 289.94 ng/dL — ABNORMAL LOW (ref 300.00–890.00)

## 2017-12-04 LAB — TSH: TSH: 0.89 u[IU]/mL (ref 0.35–4.50)

## 2017-12-04 LAB — LDL CHOLESTEROL, DIRECT: LDL DIRECT: 176 mg/dL

## 2017-12-10 ENCOUNTER — Encounter: Payer: Self-pay | Admitting: Internal Medicine

## 2018-01-07 ENCOUNTER — Encounter: Payer: Self-pay | Admitting: Neurology

## 2018-02-10 ENCOUNTER — Other Ambulatory Visit: Payer: Self-pay

## 2018-02-10 ENCOUNTER — Encounter: Payer: Self-pay | Admitting: Neurology

## 2018-02-10 ENCOUNTER — Ambulatory Visit: Payer: BLUE CROSS/BLUE SHIELD | Admitting: Neurology

## 2018-02-10 ENCOUNTER — Other Ambulatory Visit: Payer: Self-pay | Admitting: *Deleted

## 2018-02-10 VITALS — BP 148/93 | HR 97 | Resp 18 | Ht 67.0 in | Wt 178.0 lb

## 2018-02-10 DIAGNOSIS — R39198 Other difficulties with micturition: Secondary | ICD-10-CM

## 2018-02-10 DIAGNOSIS — R261 Paralytic gait: Secondary | ICD-10-CM | POA: Diagnosis not present

## 2018-02-10 DIAGNOSIS — G35 Multiple sclerosis: Secondary | ICD-10-CM | POA: Diagnosis not present

## 2018-02-10 DIAGNOSIS — Z8669 Personal history of other diseases of the nervous system and sense organs: Secondary | ICD-10-CM | POA: Diagnosis not present

## 2018-02-10 DIAGNOSIS — Z79899 Other long term (current) drug therapy: Secondary | ICD-10-CM

## 2018-02-10 NOTE — Progress Notes (Addendum)
[facility]  PATIENT: Alexander [name] DOB: Apr 13, 1976  REFERRING DOCTOR OR PCP:  Dr. Eleonore Chiquito (PCP)  _________________________________   HISTORICAL  CHIEF COMPLAINT:  Chief Complaint  Patient presents with  . Multiple Sclerosis    Last Ocrevus infusion was in March; next is scheduled in September, however he would like to discuss changing dmt's. Feels over the last 2 yrs., gait/balance are worse, arms, hands are weaker, increased generalized spasticity./fim    HISTORY OF PRESENT ILLNESS:  Alexander [name] is a 42 year old man with multiple sclerosis diagnosed in 2003 on ocrelizumab.     Update 02/10/2018: He is currently on Ocrevus since 2017.  Before that he was on Gilenya and before that was on Tysabri..  He has tolerated it well but he notes that his progression continues to do about the same on the medication.   He feels he did best on Tysabri but it was stopped because he is JCV positive.   He felt Gilenya was mildly better than Ocrevus.   His last relapse was during the transition from Tysabri to Gilenya 3 years ago.   He also had a period last month where he felt weaker 1-2 days.     His main problems is difficulty is with Leg strength and spasticity.  He has mild weakness in the arms.  His gait and balance are poor.    He continues to have some bladder frequency but no incontinence.  He has some fatigue.    He sleeps well most nights.  Mood is doing well.  Update 08/25/2017: He is scheduled for his next ocrelizumab dose 10/09/2017. He is tolerating it well and there have not been any complications. He has not noted any exacerbations and feels that his MS is stable.  One of his main issues is poor gait due to reduced balance and weakness that is worse on the left. Occasionally he will fall but no injuries. He also has reduced coordination the hands, worse on the left. For spasticity he takes baclofen with some improvement in the past (takes 3-5 pills a  day), he had tried Ampyra but it did not help. He also has truncal dysesthesias which seem better with baclofen.  The dysesthesias are better with exercise also.   He does PT every 2 weeks and goes to a gym.   The hand tremors are very mild now and not too bothersome.    Zonisamide was stopped.    He has urinary frequency with some urgency but no incontinence.   He does note fatigue and sleepiness. He feels much better after he takes a nap. He is able to adapt to the fatigue and does not require medications at this time. Mood is doing well and he continues on Lexapro. He does not have any significant problems with cognition. He is sleeping well.  Follow-up 02/20/2017: MS:   He had  ocrelizumab doses in mid and late August and second infusion was in February and went well. His next one should be in August.     He denies definite exacerbation.    He feels mostly stable.  Gait/strength/sensation: gait is poor due to poor balance and left > right weakness.     He fell to the left earlier today in the office but no LOC.   Coordination is poor in his hands, left is worse.  [name] He has spasticity in his legs.   He takes 10 mg of baclofen tid with improvement in the spasticity and tightness.  He tolerates it well.  He has hand numness but no leg numbness.     Ampyra made him feel worse so he stopped.      Truncal dyesthesia:    He has started CBD oil and feels pain is better in the flanks and he no longer has a MS hug.    He was able to cut the baclofen to just 10-20 mg daily.    Tremor: He has a tremor in his hands.  He was placed on zonisamide by his previous neurologist but it has not helped much .   The tremor is worse when he is tired and more noticeable with writing.    Bladder: He reports urinary frequency and urgency but no incontinence.    He has not had a urinary tract infections.  Vision: He has had optic neuritis in the past in both eyes.  The left eye recovered 100% but the right eyye is not at  baseline..   Fatigue/sleep:   He has had a lot more fatigue that is much worse in the hot weather.   He had one episode where he got overheated and legs were much weaker all day.   He generally sleeps well at night.  Mood/cognition: He denies any significant issues with depression or anxiety. He is on Lexapro.  He does not have major problems with cognition though he will have a little bit of mental fog at times. This was best on Tysabri but is better on ocrelizumab than on Gilenya.  MS history: In 2003 he had optic neuritis started on the left and then also on the right. He had an MRI of the brain performed at that time and was told that it was normal. He had follow-up MRIs every year or 2 for several more years but they continue to be normal. In 2012, he noticed worsening issues with gait. He had had some difficulty with the gait for the previous couple of years but due to orthopedic issues a neurologic cause was not looked into. An MRI of the cervical spine showed multiple plaques consistent with MS. Initially, he was placed on Copaxone. Although he tolerated Copaxone well he had some breakthrough disease with a new spot developing in the brain. Therefore he was switched to Tysabri. He tolerated Tysabri well and his MS was well controlled. However, she was JCV antibody positive a decision was made to stop Tysabri after 2 years. Years ago he went on Gilenya. He has tolerated it well and he did better on Tysabri. He was seeing [facility] [address]. He had Ocrevus August 2017.      REVIEW OF SYSTEMS: Constitutional: No fevers, chills, sweats, or change in appetite.   Reports minimal fatigue. Eyes: No visual changes, double vision, eye pain Ear, nose and throat: No hearing loss, ear pain, nasal congestion, sore throat Cardiovascular: No chest pain, palpitations Respiratory: No shortness of breath at rest or with exertion.   No wheezes GastrointestinaI: No  nausea, vomiting, diarrhea, abdominal pain, fecal incontinence Genitourinary: reports frequency, hesitancy and nocturia. Musculoskeletal: No neck pain, back pain Integumentary: No rash, pruritus, skin lesions Neurological: as above Psychiatric: No depression at this time.  No anxiety Endocrine: No palpitations, diaphoresis, change in appetite, change in weigh or increased thirst Hematologic/Lymphatic: No anemia, purpura, petechiae. Allergic/Immunologic: No itchy/runny eyes, nasal congestion, recent allergic reactions, rashes  ALLERGIES: No Known Allergies  HOME MEDICATIONS:  Current Outpatient Medications:  .  baclofen (LIORESAL) 10 MG  tablet, Take 4 to 5 tablets a day for spasticity, Disp: 450 tablet, Rfl: 3 .  Cholecalciferol (VITAMIN D PO), Take by mouth. Take 5000-7000 units by mouth daily, Disp: , Rfl:  .  escitalopram (LEXAPRO) 20 MG tablet, Take 1 tablet (20 mg total) by mouth daily., Disp: 90 tablet, Rfl: 3 .  Multiple Vitamin (MULTIVITAMIN WITH MINERALS) TABS tablet, Take 1 tablet by mouth daily., Disp: , Rfl:  .  Nutritional Supplements (JUICE PLUS FIBRE) LIQD, Take 12 capsules by mouth daily. FRUITS AND VEGETABLES AND ANTI-OXIDANTS., Disp: , Rfl:  .  ocrelizumab 600 mg in sodium chloride 0.9 % 500 mL, Inject 600 mg into the vein every 6 (six) months., Disp: , Rfl:  .  Omega-3 Fatty Acids (FISH OIL) 1000 MG CAPS, Take 2 capsules by mouth daily., Disp: , Rfl:  .  UNABLE TO FIND, CBD oil 500mg  once daily, Disp: , Rfl:  No current facility-administered medications for this visit.   Facility-Administered Medications Ordered in Other Visits:  .  gadopentetate dimeglumine (MAGNEVIST) injection 17 mL, 17 mL, Intravenous, Once PRN, Anson Fret, MD  PAST MEDICAL HISTORY: Past Medical History:  Diagnosis Date  . Hypertension   . Multiple sclerosis (HCC)   . Vision abnormalities     PAST SURGICAL HISTORY: Past Surgical History:  Procedure Laterality Date  . ACHILLES  TENDON SURGERY    . ANTERIOR CRUCIATE LIGAMENT REPAIR    . MENISCUS REPAIR      FAMILY HISTORY: Family History  Problem Relation Age of Onset  . Congestive Heart Failure Father   . Stroke Maternal Grandfather     SOCIAL HISTORY:  Social History   Socioeconomic History  . Marital status: Divorced    Spouse name: Not on file  . Number of children: Not on file  . Years of education: Not on file  . Highest education level: Not on file  Occupational History  . Not on file  Social Needs  . Financial resource strain: Not on file  . Food insecurity:    Worry: Not on file    Inability: Not on file  . Transportation needs:    Medical: Not on file    Non-medical: Not on file  Tobacco Use  . Smoking status: Never Smoker  . Smokeless tobacco: Never Used  Substance and Sexual Activity  . Alcohol use: No  . Drug use: No  . Sexual activity: Not on file  Lifestyle  . Physical activity:    Days per week: Not on file    Minutes per session: Not on file  . Stress: Not on file  Relationships  . Social connections:    Talks on phone: Not on file    Gets together: Not on file    Attends religious service: Not on file    Active member of club or organization: Not on file    Attends meetings of clubs or organizations: Not on file    Relationship status: Not on file  . Intimate partner violence:    Fear of current or ex partner: Not on file    Emotionally abused: Not on file    Physically abused: Not on file    Forced sexual activity: Not on file  Other Topics Concern  . Not on file  Social History Narrative  . Not on file     PHYSICAL EXAM  Vitals:   02/10/18 1322  BP: (!) 148/93  Pulse: 97  Resp: 18  Weight: 178 lb (80.7 kg)  Height: 5\' 7"  (1.702 m)    Body mass index is 27.88 kg/m.   General: The patient is well-developed and well-nourished and in no acute distress.   Scrape on left elbow and knee from fall.    Neurologic Exam  Mental status: The patient is  alert and oriented x 3 at the time of the examination. The patient has apparent normal recent and remote memory, with an apparently normal attention span and concentration ability.   Speech is normal.  Cranial nerves: Extraocular movements exam shows nystagmus on right gaze. Pupils show 2+ right APD and colors are desaturated on the right. Facial strength and sensation and trapezius strength were normal.   The tongue is midline, and the patient has symmetric elevation of the soft palate. No obvious hearing deficits are noted.  Motor:  Muscle bulk is normal.   Muscle tone is increased in the legs, left leg > the right and mildly increased in the left arm.   Right arm has normal tone.   He has 4+/5 strength in the left leg and 5 minus/5 strength in the right leg. Rapid alternating movements are slow with the left arm and leg  Sensory: He has slightly reduced left vibration sensation in the leg   Coordination: Finger-nose-finger is performed slowly on the left. Heel-to-shin is performed poorly left worse than right.   Gait and station: Station is normal.   The gait is wide and spastic and is better with umilat support.  . There is a mild left foot drop. He cannot tandem walk. Romberg is positive.   Reflexes: Deep tendon reflexes are symmetric and increased in legs,with spread at both knees (worse on left) and nonsustained clonus at left ankle.          DIAGNOSTIC DATA (LABS, IMAGING, TESTING) - I reviewed patient records, labs, notes, testing and imaging myself where available.      ASSESSMENT AND PLAN  Multiple sclerosis (HCC) - Plan: Hepatitis B surface antigen, Hepatitis B core antibody, total, HIV antibody, QuantiFERON-TB Gold Plus, CBC with Differential/Platelet, Comprehensive metabolic panel, Varicella zoster antibody, IgG, Hepatitis B surface antibody, Hepatitis C antibody, Cytochrome P450 2C9 Genotyping  High risk medication use - Plan: Hepatitis B surface antigen, Hepatitis B core  antibody, total, HIV antibody, QuantiFERON-TB Gold Plus, CBC with Differential/Platelet, Comprehensive metabolic panel, Varicella zoster antibody, IgG, Hepatitis B surface antibody, Hepatitis C antibody, Cytochrome P450 2C9 Genotyping  Spastic gait  Urinary dysfunction  History of optic neuritis   1.    He feels he did better on other medications in the Ocrevus and would like to switch.  We had a long discussion about the risks and benefits of Mayzent, Mavenclad and Lemtrada.   We will check an EKG and lab work and he will try to decide over the next week. 2.   Continue to be active and exercise as tolerated    3.   Continue baclofen for spasticity.   Lexapro for mood 4.    He will return to see me in 6 months for a regular visit or call sooner if there are new or worsening neurologic symptoms.  45-minute face-to-face evaluation with greater than one half the time counseling coordinating care about disease modifying therapy options for his active/relapsing secondary progressive MS.    Addendum:   EKG is normal   Jalik Gellatly A. Epimenio Foot, MD, PhD, FAAN Certified in Neurology, Clinical Neurophysiology, Sleep Medicine, Pain Medicine and Neuroimaging Director, [facility]  [facility] [address], [place], [place] [address] [phone]

## 2018-02-11 ENCOUNTER — Telehealth: Payer: Self-pay | Admitting: Neurology

## 2018-02-11 NOTE — Telephone Encounter (Signed)
MR Brain w/wo contrast & MR cervical spine w/wo contrast Dr. Gershon Mussel Auth: [id number] (exp. 02/11/18 to 03/12/18). Patient is scheduled for 02/18/18 at [facility].

## 2018-02-17 LAB — HEPATITIS B CORE ANTIBODY, TOTAL: Hep B Core Total Ab: NEGATIVE

## 2018-02-17 LAB — HEPATITIS B SURFACE ANTIBODY,QUALITATIVE: Hep B Surface Ab, Qual: NONREACTIVE

## 2018-02-17 LAB — QUANTIFERON-TB GOLD PLUS
QUANTIFERON NIL VALUE: 0.01 [IU]/mL
QuantiFERON TB1 Ag Value: 0.01 IU/mL
QuantiFERON TB2 Ag Value: 0.01 IU/mL
QuantiFERON-TB Gold Plus: NEGATIVE

## 2018-02-17 LAB — COMPREHENSIVE METABOLIC PANEL
ALBUMIN: 4.5 g/dL (ref 3.5–5.5)
ALT: 17 IU/L (ref 0–44)
AST: 13 IU/L (ref 0–40)
Albumin/Globulin Ratio: 2 (ref 1.2–2.2)
Alkaline Phosphatase: 57 IU/L (ref 39–117)
BUN / CREAT RATIO: 13 (ref 9–20)
BUN: 12 mg/dL (ref 6–24)
Bilirubin Total: 0.5 mg/dL (ref 0.0–1.2)
CALCIUM: 9.7 mg/dL (ref 8.7–10.2)
CO2: 25 mmol/L (ref 20–29)
CREATININE: 0.93 mg/dL (ref 0.76–1.27)
Chloride: 102 mmol/L (ref 96–106)
GFR calc Af Amer: 116 mL/min/{1.73_m2} (ref >59)
GFR, EST NON AFRICAN AMERICAN: 100 mL/min/{1.73_m2} (ref >59)
GLOBULIN, TOTAL: 2.2 g/dL (ref 1.5–4.5)
Glucose: 56 mg/dL — ABNORMAL LOW (ref 65–99)
Potassium: 4.8 mmol/L (ref 3.5–5.2)
SODIUM: 142 mmol/L (ref 134–144)
TOTAL PROTEIN: 6.7 g/dL (ref 6.0–8.5)

## 2018-02-17 LAB — CBC WITH DIFFERENTIAL/PLATELET
Basophils Absolute: 0.1 10*3/uL (ref 0.0–0.2)
Basos: 2 %
EOS (ABSOLUTE): 0.4 10*3/uL (ref 0.0–0.4)
Eos: 8 %
HEMATOCRIT: 46 % (ref 37.5–51.0)
HEMOGLOBIN: 15.5 g/dL (ref 13.0–17.7)
IMMATURE GRANS (ABS): 0 10*3/uL (ref 0.0–0.1)
IMMATURE GRANULOCYTES: 0 %
Lymphocytes Absolute: 0.7 10*3/uL (ref 0.7–3.1)
Lymphs: 14 %
MCH: 28.1 pg (ref 26.6–33.0)
MCHC: 33.7 g/dL (ref 31.5–35.7)
MCV: 84 fL (ref 79–97)
MONOCYTES: 11 %
Monocytes Absolute: 0.5 10*3/uL (ref 0.1–0.9)
NEUTROS PCT: 65 %
Neutrophils Absolute: 3.3 10*3/uL (ref 1.4–7.0)
Platelets: 213 10*3/uL (ref 150–450)
RBC: 5.51 x10E6/uL (ref 4.14–5.80)
RDW: 14.8 % (ref 12.3–15.4)
WBC: 5 10*3/uL (ref 3.4–10.8)

## 2018-02-17 LAB — HEPATITIS C ANTIBODY: Hep C Virus Ab: <0.1 s/co ratio (ref 0.0–0.9)

## 2018-02-17 LAB — HIV ANTIBODY (ROUTINE TESTING W REFLEX): HIV Screen 4th Generation wRfx: NONREACTIVE

## 2018-02-17 LAB — VARICELLA ZOSTER ANTIBODY, IGG: Varicella zoster IgG: 1709 index (ref >165)

## 2018-02-17 LAB — HEPATITIS B SURFACE ANTIGEN: Hepatitis B Surface Ag: NEGATIVE

## 2018-02-17 LAB — CYTOCHROME P450 2C9 GENOTYPING

## 2018-02-18 ENCOUNTER — Ambulatory Visit: Payer: BLUE CROSS/BLUE SHIELD

## 2018-02-18 DIAGNOSIS — G35 Multiple sclerosis: Secondary | ICD-10-CM

## 2018-02-18 MED ORDER — GADOPENTETATE DIMEGLUMINE 469.01 MG/ML IV SOLN
15.0000 mL | Freq: Once | INTRAVENOUS | Status: AC | PRN
  Administered 2018-02-18: 15 mL via INTRAVENOUS

## 2018-02-19 ENCOUNTER — Telehealth: Payer: Self-pay | Admitting: Neurology

## 2018-02-19 DIAGNOSIS — G373 Acute transverse myelitis in demyelinating disease of central nervous system: Secondary | ICD-10-CM

## 2018-02-19 DIAGNOSIS — Z8669 Personal history of other diseases of the nervous system and sense organs: Secondary | ICD-10-CM

## 2018-02-19 DIAGNOSIS — G35 Multiple sclerosis: Secondary | ICD-10-CM

## 2018-02-19 DIAGNOSIS — Z79899 Other long term (current) drug therapy: Secondary | ICD-10-CM

## 2018-02-19 DIAGNOSIS — G35D Multiple sclerosis, unspecified: Secondary | ICD-10-CM

## 2018-02-19 NOTE — Telephone Encounter (Signed)
I spoke with Alexander [name] the lab work, EKG are fine.  The MRI of the brain and cervical spine do not appear to show any new lesions.    Therefore, he could go on either [place], Mayzent or Mavenclad.  He does have confluent lesions in the spinal cord and I do not see where he has been tested for neuromyelitis optica.  Therefore, before we start any medications I would like to check the anti-NMO antibody and I have placed the orders.  He will give the choice of medications some more thought and also could stay on Ocrevus

## 2018-02-20 ENCOUNTER — Other Ambulatory Visit (INDEPENDENT_AMBULATORY_CARE_PROVIDER_SITE_OTHER): Payer: Self-pay

## 2018-02-20 DIAGNOSIS — Z79899 Other long term (current) drug therapy: Secondary | ICD-10-CM

## 2018-02-20 DIAGNOSIS — Z0289 Encounter for other administrative examinations: Secondary | ICD-10-CM

## 2018-02-20 DIAGNOSIS — G373 Acute transverse myelitis in demyelinating disease of central nervous system: Secondary | ICD-10-CM

## 2018-02-20 DIAGNOSIS — Z8669 Personal history of other diseases of the nervous system and sense organs: Secondary | ICD-10-CM

## 2018-02-25 ENCOUNTER — Encounter: Payer: Self-pay | Admitting: Neurology

## 2018-02-25 ENCOUNTER — Telehealth: Payer: Self-pay | Admitting: *Deleted

## 2018-02-25 LAB — NEUROMYELITIS OPTICA AUTOAB, IGG: NMO IgG Autoantibodies: <1.5 U/mL (ref 0.0–3.0)

## 2018-02-25 LAB — TSH: TSH: 1.5 u[IU]/mL (ref 0.450–4.500)

## 2018-02-25 NOTE — Telephone Encounter (Signed)
Spoke with Alexander [name] and reviewed below lab results.  He verbalized understanding of same, sts. he is going to continue Ocrevus for now/fim

## 2018-02-25 NOTE — Telephone Encounter (Signed)
-----   Message from Asa Lente, MD sent at 02/25/2018  1:58 PM EDT ----- Please let the patient know that the additional lab work is fine so any of the treatment options are fine

## 2018-03-27 ENCOUNTER — Encounter: Payer: Self-pay | Admitting: Family Medicine

## 2018-03-30 ENCOUNTER — Telehealth: Payer: Self-pay | Admitting: Family Medicine

## 2018-03-30 DIAGNOSIS — Z0000 Encounter for general adult medical examination without abnormal findings: Secondary | ICD-10-CM

## 2018-03-30 NOTE — Telephone Encounter (Signed)
Please schedule a total and free testosterone as well as a fasting lipid panel in October

## 2018-03-30 NOTE — Telephone Encounter (Signed)
Pt notified and verbalized understanding.

## 2018-03-30 NOTE — Telephone Encounter (Signed)
Copied from CRM [id number]. Topic: Quick Communication - See Telephone Encounter >> Mar 27, 2018  4:38 PM Herby Abraham C wrote: CRM for notification. See Telephone encounter for: 03/27/18.  Pt says that he seen Dr. Kirtland Bouchard and was advised that he need to have his Testosterone and A1C rechecked in October. Scheduled pt a lab visit for October, if office would please place orders.

## 2018-03-30 NOTE — Telephone Encounter (Signed)
Would you like this pt to come in sooner since you are leaving or follow-up with his new PCP for the issue?  Please advise

## 2018-03-30 NOTE — Telephone Encounter (Signed)
I would have Dr. Raliegh Ip order these since I have not met him yet

## 2018-03-30 NOTE — Telephone Encounter (Signed)
Sorry Dr.Fry. I was supposed to have sent this to Dr.K

## 2018-04-09 DIAGNOSIS — G35 Multiple sclerosis: Secondary | ICD-10-CM | POA: Diagnosis not present

## 2018-04-14 ENCOUNTER — Encounter: Payer: Self-pay | Admitting: *Deleted

## 2018-05-05 ENCOUNTER — Ambulatory Visit (INDEPENDENT_AMBULATORY_CARE_PROVIDER_SITE_OTHER): Payer: Medicare Other

## 2018-05-05 ENCOUNTER — Other Ambulatory Visit (INDEPENDENT_AMBULATORY_CARE_PROVIDER_SITE_OTHER): Payer: Medicare Other

## 2018-05-05 DIAGNOSIS — Z23 Encounter for immunization: Secondary | ICD-10-CM | POA: Diagnosis not present

## 2018-05-05 DIAGNOSIS — Z0000 Encounter for general adult medical examination without abnormal findings: Secondary | ICD-10-CM | POA: Diagnosis not present

## 2018-05-05 LAB — LIPID PANEL
CHOL/HDL RATIO: 7
Cholesterol: 231 mg/dL — ABNORMAL HIGH (ref 0–200)
HDL: 33.6 mg/dL — ABNORMAL LOW (ref >39.00)
NONHDL: 197.45
Triglycerides: 253 mg/dL — ABNORMAL HIGH (ref 0.0–149.0)
VLDL: 50.6 mg/dL — ABNORMAL HIGH (ref 0.0–40.0)

## 2018-05-05 LAB — LDL CHOLESTEROL, DIRECT: LDL DIRECT: 164 mg/dL

## 2018-05-05 NOTE — Progress Notes (Signed)
Per orders of Dr. Clent Ridges , injection of Fluarix Quadrivalent given by Carola Rhine. Patient tolerated injection well.

## 2018-05-06 LAB — TESTOSTERONE TOTAL,FREE,BIO, MALES
ALBUMIN MSPROF: 4.5 g/dL (ref 3.6–5.1)
SEX HORMONE BINDING: 23 nmol/L (ref 10–50)
Testosterone: 183 ng/dL — ABNORMAL LOW (ref 250–827)

## 2018-08-13 ENCOUNTER — Encounter: Payer: Self-pay | Admitting: Neurology

## 2018-08-13 ENCOUNTER — Ambulatory Visit (INDEPENDENT_AMBULATORY_CARE_PROVIDER_SITE_OTHER): Payer: Medicare Other | Admitting: Neurology

## 2018-08-13 VITALS — BP 136/86 | HR 79 | Ht 67.0 in | Wt 164.5 lb

## 2018-08-13 DIAGNOSIS — Z8669 Personal history of other diseases of the nervous system and sense organs: Secondary | ICD-10-CM

## 2018-08-13 DIAGNOSIS — M549 Dorsalgia, unspecified: Secondary | ICD-10-CM | POA: Diagnosis not present

## 2018-08-13 DIAGNOSIS — R39198 Other difficulties with micturition: Secondary | ICD-10-CM | POA: Diagnosis not present

## 2018-08-13 DIAGNOSIS — R261 Paralytic gait: Secondary | ICD-10-CM

## 2018-08-13 DIAGNOSIS — G35 Multiple sclerosis: Secondary | ICD-10-CM

## 2018-08-13 DIAGNOSIS — Z79899 Other long term (current) drug therapy: Secondary | ICD-10-CM

## 2018-08-13 NOTE — Progress Notes (Signed)
[facility]  PATIENT: Alexander [name] DOB: 04/04/1976  REFERRING DOCTOR OR PCP:  Dr. Eleonore ChiquitoPeter Kwiatkowski (PCP)  _________________________________   HISTORICAL  CHIEF COMPLAINT:  Chief Complaint  Patient presents with  . Follow-up    RM 12, alone. Last seen 02/10/18  . Multiple Sclerosis    On Ocrevus. Last infusion: 04/2018. Due in March 2020 for next infusion.  . Gait Problem    Ambulates with walker. Has had several falls since last seen. Denies hitting head or any significant injuries.   . Spasms    He is taking baclofen 5mg  (1/2 tab of 10mg ) twice daily and 10mg  at bedtime.     HISTORY OF PRESENT ILLNESS:  Alexander MallingSteven [name] is a 43 y.o. man with multiple sclerosis diagnosed in 2003 on ocrelizumab.     Update 08/13/2018: He is on Ocrevus.  He toleates it well but felt he did better on Tysabri.  We had discussed a switch in therapy but he decided to stay on Ocrevus.  He does note hiccups with his infusions.   He would like to stay on Ocrevus in 2020 but consider a different medication if gait continues to worsen.   He has difficulty with his gait and is using a walker more.     His left side does worse than his right and he has a left foot drop.     He has a couple falls, none were bad.    He notes mild weakness in his hands and arms. He has spasticity but felt weaker on 10 mg baclofen so went back to 5 mg.    No significant numbness or dysesthesia.   Vision is about the same, right worse than left.     Colors washed put OD and V is mildly reduced.  Bladder has been the same with some frequency and urgency but no incontinence.   He is sleeping better with CBD oil and baclofen   6 to 8 hrs.  Fatigue is mild.    Cognition is doing well.   Mood is ok - he remains on escitalopram 20 mg po qod.     He notes a lot of mid back pain and asks about a TPI.  These had helped in in the past ([place] he moved here)  Update 02/10/2018: He is currently on Ocrevus since 2017.  Before  that he was on Gilenya and before that was on Tysabri..  He has tolerated it well but he notes that his progression continues to do about the same on the medication.   He feels he did best on Tysabri but it was stopped because he is JCV positive.   He felt Gilenya was mildly better than Ocrevus.   His last relapse was during the transition from Tysabri to Gilenya 3 years ago.   He also had a period last month where he felt weaker 1-2 days.     His main problems is difficulty is with Leg strength and spasticity.  He has mild weakness in the arms.  His gait and balance are poor.    He continues to have some bladder frequency but no incontinence.  He has some fatigue.    He sleeps well most nights.  Mood is doing well.  Update 08/25/2017: He is scheduled for his next ocrelizumab dose 10/09/2017. He is tolerating it well and there have not been any complications. He has not noted any exacerbations and feels that his MS is stable.  One of his main issues  is poor gait due to reduced balance and weakness that is worse on the left. Occasionally he will fall but no injuries. He also has reduced coordination the hands, worse on the left. For spasticity he takes baclofen with some improvement in the past (takes 3-5 pills a day), he had tried Ampyra but it did not help. He also has truncal dysesthesias which seem better with baclofen.  The dysesthesias are better with exercise also.   He does PT every 2 weeks and goes to a gym.   The hand tremors are very mild now and not too bothersome.    Zonisamide was stopped.    He has urinary frequency with some urgency but no incontinence.   He does note fatigue and sleepiness. He feels much better after he takes a nap. He is able to adapt to the fatigue and does not require medications at this time. Mood is doing well and he continues on Lexapro. He does not have any significant problems with cognition. He is sleeping well.  Follow-up 02/20/2017: MS:   He had  ocrelizumab  doses in mid and late August and second infusion was in February and went well. His next one should be in August.     He denies definite exacerbation.    He feels mostly stable.  Gait/strength/sensation: gait is poor due to poor balance and left > right weakness.     He fell to the left earlier today in the office but no LOC.   Coordination is poor in his hands, left is worse.  [name] He has spasticity in his legs.   He takes 10 mg of baclofen tid with improvement in the spasticity and tightness.  He tolerates it well.  He has hand numness but no leg numbness.     Ampyra made him feel worse so he stopped.      Truncal dyesthesia:    He has started CBD oil and feels pain is better in the flanks and he no longer has a MS hug.    He was able to cut the baclofen to just 10-20 mg daily.    Tremor: He has a tremor in his hands.  He was placed on zonisamide by his previous neurologist but it has not helped much .   The tremor is worse when he is tired and more noticeable with writing.    Bladder: He reports urinary frequency and urgency but no incontinence.    He has not had a urinary tract infections.  Vision: He has had optic neuritis in the past in both eyes.  The left eye recovered 100% but the right eyye is not at baseline..   Fatigue/sleep:   He has had a lot more fatigue that is much worse in the hot weather.   He had one episode where he got overheated and legs were much weaker all day.   He generally sleeps well at night.  Mood/cognition: He denies any significant issues with depression or anxiety. He is on Lexapro.  He does not have major problems with cognition though he will have a little bit of mental fog at times. This was best on Tysabri but is better on ocrelizumab than on Gilenya.  MS history: In 2003 he had optic neuritis started on the left and then also on the right. He had an MRI of the brain performed at that time and was told that it was normal. He had follow-up MRIs every year or 2 for  several more years  but they continue to be normal. In 2012, he noticed worsening issues with gait. He had had some difficulty with the gait for the previous couple of years but due to orthopedic issues a neurologic cause was not looked into. An MRI of the cervical spine showed multiple plaques consistent with MS. Initially, he was placed on Copaxone. Although he tolerated Copaxone well he had some breakthrough disease with a new spot developing in the brain. Therefore he was switched to Tysabri. He tolerated Tysabri well and his MS was well controlled. However, she was JCV antibody positive a decision was made to stop Tysabri after 2 years. Years ago he went on Gilenya. He has tolerated it well and he did better on Tysabri. He was seeing Dr. Carroll Sage at [facility] on [address]. He had Ocrevus August 2017.      REVIEW OF SYSTEMS: Constitutional: No fevers, chills, sweats, or change in appetite.   Reports minimal fatigue. Eyes: No visual changes, double vision, eye pain Ear, nose and throat: No hearing loss, ear pain, nasal congestion, sore throat Cardiovascular: No chest pain, palpitations Respiratory: No shortness of breath at rest or with exertion.   No wheezes GastrointestinaI: No nausea, vomiting, diarrhea, abdominal pain, fecal incontinence Genitourinary: reports frequency, hesitancy and nocturia. Musculoskeletal: No neck pain, back pain Integumentary: No rash, pruritus, skin lesions Neurological: as above Psychiatric: No depression at this time.  No anxiety Endocrine: No palpitations, diaphoresis, change in appetite, change in weigh or increased thirst Hematologic/Lymphatic: No anemia, purpura, petechiae. Allergic/Immunologic: No itchy/runny eyes, nasal congestion, recent allergic reactions, rashes  ALLERGIES: No Known Allergies  HOME MEDICATIONS:  Current Outpatient Medications:  .  baclofen (LIORESAL) 10 MG tablet, Take 4 to 5 tablets a day for spasticity  (Patient taking differently: Take 5 mg by mouth. 5 mg BID and 10mg  at bedtime), Disp: 450 tablet, Rfl: 3 .  Cholecalciferol (VITAMIN D PO), Take by mouth. Take 5000-7000 units by mouth daily, Disp: , Rfl:  .  Coenzyme Q10 (CO Q 10 PO), Take by mouth., Disp: , Rfl:  .  escitalopram (LEXAPRO) 20 MG tablet, Take 1 tablet (20 mg total) by mouth daily., Disp: 90 tablet, Rfl: 3 .  Multiple Vitamin (MULTIVITAMIN WITH MINERALS) TABS tablet, Take 1 tablet by mouth daily., Disp: , Rfl:  .  Nutritional Supplements (JUICE PLUS FIBRE) LIQD, Take 12 capsules by mouth daily. FRUITS AND VEGETABLES AND ANTI-OXIDANTS., Disp: , Rfl:  .  ocrelizumab 600 mg in sodium chloride 0.9 % 500 mL, Inject 600 mg into the vein every 6 (six) months., Disp: , Rfl:  .  Omega-3 Fatty Acids (FISH OIL) 1000 MG CAPS, Take 2 capsules by mouth daily., Disp: , Rfl:  .  UNABLE TO FIND, CBD oil 500mg  once daily, Disp: , Rfl:  .  UNABLE TO FIND, Take 1 Dose by mouth daily. Med Name: MitoQ, Disp: , Rfl:  No current facility-administered medications for this visit.   Facility-Administered Medications Ordered in Other Visits:  .  gadopentetate dimeglumine (MAGNEVIST) injection 17 mL, 17 mL, Intravenous, Once PRN, Anson Fret, MD  PAST MEDICAL HISTORY: Past Medical History:  Diagnosis Date  . Hypertension   . Multiple sclerosis (HCC)   . Vision abnormalities     PAST SURGICAL HISTORY: Past Surgical History:  Procedure Laterality Date  . ACHILLES TENDON SURGERY    . ANTERIOR CRUCIATE LIGAMENT REPAIR    . MENISCUS REPAIR      FAMILY HISTORY: Family History  Problem Relation Age of  Onset  . Congestive Heart Failure Father   . Stroke Maternal Grandfather     SOCIAL HISTORY:  Social History   Socioeconomic History  . Marital status: Divorced    Spouse name: Not on file  . Number of children: Not on file  . Years of education: Not on file  . Highest education level: Not on file  Occupational History  . Not on file    Social Needs  . Financial resource strain: Not on file  . Food insecurity:    Worry: Not on file    Inability: Not on file  . Transportation needs:    Medical: Not on file    Non-medical: Not on file  Tobacco Use  . Smoking status: Never Smoker  . Smokeless tobacco: Never Used  Substance and Sexual Activity  . Alcohol use: No  . Drug use: No  . Sexual activity: Not on file  Lifestyle  . Physical activity:    Days per week: Not on file    Minutes per session: Not on file  . Stress: Not on file  Relationships  . Social connections:    Talks on phone: Not on file    Gets together: Not on file    Attends religious service: Not on file    Active member of club or organization: Not on file    Attends meetings of clubs or organizations: Not on file    Relationship status: Not on file  . Intimate partner violence:    Fear of current or ex partner: Not on file    Emotionally abused: Not on file    Physically abused: Not on file    Forced sexual activity: Not on file  Other Topics Concern  . Not on file  Social History Narrative  . Not on file     PHYSICAL EXAM  Vitals:   08/13/18 1528  BP: 136/86  Pulse: 79  Weight: 164 lb 8 oz (74.6 kg)  Height: 5\' 7"  (1.702 m)    Body mass index is 25.76 kg/m.   General: The patient is well-developed and well-nourished and in no acute distress.   Tender over the lower thoracic paraspinal muscles.   Neurologic Exam  Mental status: The patient is alert and oriented x 3 at the time of the examination. The patient has apparent normal recent and remote memory, with an apparently normal attention span and concentration ability.   Speech is normal.  Cranial nerves: Extraocular movements exam shows nystagmus on right gaze.  He has reduced color vision out of the right eye.. Facial strength and sensation and trapezius strength were normal.   The tongue is midline, and the patient has symmetric elevation of the soft palate. No obvious  hearing deficits are noted.  Motor:  Muscle bulk is normal.  Muscle tone is increased in the legs, left greater than right and in the left arm.   Right arm has normal tone.   He has 4+/5 strength in the left leg and 5 minus/5 strength in the right leg.  He has reduced rapid alternating movements in the left hand.  Sensory: He has slightly reduced left vibration sensation in the leg   Coordination: Finger-nose-finger is performed slowly on the left. Heel-to-shin is performed poorly left worse than right.   Gait and station: Station is normal.   The gait is wide and spastic and is better with umilat support.  . He has a left foot drop.  He is unable to do tandem  walk.  Romberg is positive.   Reflexes: Deep tendon reflexes are symmetric and increased in legs,with spread at both knees (worse on left) and nonsustained clonus at left ankle.          DIAGNOSTIC DATA (LABS, IMAGING, TESTING) - I reviewed patient records, labs, notes, testing and imaging myself where available.      ASSESSMENT AND PLAN  Multiple sclerosis (HCC) - Plan: CBC with Differential/Platelet, IgG, IgA, IgM  High risk medication use - Plan: CBC with Differential/Platelet, IgG, IgA, IgM  Spastic gait  Urinary dysfunction  History of optic neuritis   1.    For now, he would like to stay on Ocrevus.  He would consider a different disease modifying therapy if he has a breakthrough disease.  We will check lab work.   2.   Continue to be active and exercise as tolerated    3.   Continue baclofen for spasticity.   Lexapro for mood 4.    TPI at T11T12 and T12 L1 paraspinal muscle bilaterally with 80 mg Depo-Medrol in Marcaine using sterile technique. He will return to see me in 6 months for a regular visit or call sooner if there are new or worsening neurologic symptoms.     Keyondre Hepburn A. Epimenio FootSater, MD, PhD, FAAN Certified in Neurology, Clinical Neurophysiology, Sleep Medicine, Pain Medicine and Neuroimaging Director,  [facility]  [facility] [address], [address] [place], [place] [address] [phone]

## 2018-08-14 ENCOUNTER — Telehealth: Payer: Self-pay | Admitting: *Deleted

## 2018-08-14 LAB — CBC WITH DIFFERENTIAL/PLATELET
BASOS: 1 %
Basophils Absolute: 0 10*3/uL (ref 0.0–0.2)
EOS (ABSOLUTE): 0.4 10*3/uL (ref 0.0–0.4)
Eos: 10 %
Hematocrit: 43 % (ref 37.5–51.0)
Hemoglobin: 15 g/dL (ref 13.0–17.7)
Immature Grans (Abs): 0 10*3/uL (ref 0.0–0.1)
Immature Granulocytes: 0 %
Lymphocytes Absolute: 0.5 10*3/uL — ABNORMAL LOW (ref 0.7–3.1)
Lymphs: 15 %
MCH: 27.8 pg (ref 26.6–33.0)
MCHC: 34.9 g/dL (ref 31.5–35.7)
MCV: 80 fL (ref 79–97)
MONOS ABS: 0.4 10*3/uL (ref 0.1–0.9)
Monocytes: 12 %
NEUTROS PCT: 62 %
Neutrophils Absolute: 2.2 10*3/uL (ref 1.4–7.0)
PLATELETS: 202 10*3/uL (ref 150–450)
RBC: 5.39 x10E6/uL (ref 4.14–5.80)
RDW: 13.2 % (ref 11.6–15.4)
WBC: 3.6 10*3/uL (ref 3.4–10.8)

## 2018-08-14 LAB — IGG, IGA, IGM
IGG (IMMUNOGLOBIN G), SERUM: 884 mg/dL (ref 700–1600)
IgA/Immunoglobulin A, Serum: 284 mg/dL (ref 90–386)
IgM (Immunoglobulin M), Srm: 41 mg/dL (ref 20–172)

## 2018-08-14 NOTE — Telephone Encounter (Signed)
Spoke to patient to inform him of his lab results.

## 2018-08-14 NOTE — Telephone Encounter (Signed)
-----   Message from Asa Lente, MD sent at 08/14/2018 12:40 PM EST ----- Please let the patient know that the lab work is fine.

## 2018-09-08 ENCOUNTER — Telehealth: Payer: Self-pay | Admitting: Neurology

## 2018-09-08 NOTE — Telephone Encounter (Signed)
pt has called for the intrafusion suite, call transferred °

## 2018-10-15 ENCOUNTER — Telehealth: Payer: Self-pay | Admitting: *Deleted

## 2018-10-15 MED ORDER — ESCITALOPRAM OXALATE 20 MG PO TABS: 20.0000 mg | ORAL_TABLET | Freq: Every day | ORAL | 3 refills | Status: DC

## 2018-10-15 MED ORDER — BACLOFEN 10 MG PO TABS: ORAL_TABLET | ORAL | 3 refills | Status: DC

## 2018-10-15 NOTE — Telephone Encounter (Signed)
Baclofen and Lexapro escribed to [facility] in response to faxed request from them/fim

## 2018-10-19 DIAGNOSIS — L814 Other melanin hyperpigmentation: Secondary | ICD-10-CM | POA: Diagnosis not present

## 2018-10-19 DIAGNOSIS — D1801 Hemangioma of skin and subcutaneous tissue: Secondary | ICD-10-CM | POA: Diagnosis not present

## 2018-10-19 DIAGNOSIS — L821 Other seborrheic keratosis: Secondary | ICD-10-CM | POA: Diagnosis not present

## 2018-10-19 DIAGNOSIS — D229 Melanocytic nevi, unspecified: Secondary | ICD-10-CM | POA: Diagnosis not present

## 2018-10-20 ENCOUNTER — Telehealth: Payer: Self-pay | Admitting: Neurology

## 2018-10-20 NOTE — Telephone Encounter (Signed)
pt has called for the intrafusion suite, call transferred °

## 2018-10-21 DIAGNOSIS — G35 Multiple sclerosis: Secondary | ICD-10-CM | POA: Diagnosis not present

## 2018-12-25 DIAGNOSIS — D22122 Melanocytic nevi of left lower eyelid, including canthus: Secondary | ICD-10-CM | POA: Diagnosis not present

## 2018-12-25 DIAGNOSIS — D485 Neoplasm of uncertain behavior of skin: Secondary | ICD-10-CM | POA: Diagnosis not present

## 2019-02-10 ENCOUNTER — Telehealth: Payer: Self-pay | Admitting: Neurology

## 2019-02-10 NOTE — Telephone Encounter (Signed)
Correction- Patient's appointment is on 02/11/19.

## 2019-02-10 NOTE — Telephone Encounter (Signed)
Called patient and LVM to confirm his 7/8 appointment. Requested patient call back if he needs to reschedule. Office contact info provided.

## 2019-02-11 ENCOUNTER — Encounter: Payer: Self-pay | Admitting: Neurology

## 2019-02-11 ENCOUNTER — Ambulatory Visit: Payer: Medicare Other | Admitting: Neurology

## 2019-02-11 ENCOUNTER — Other Ambulatory Visit: Payer: Self-pay

## 2019-02-11 VITALS — BP 133/89 | HR 81 | Temp 98.4°F | Ht 67.0 in | Wt 169.0 lb

## 2019-02-11 DIAGNOSIS — Z79899 Other long term (current) drug therapy: Secondary | ICD-10-CM

## 2019-02-11 DIAGNOSIS — G35 Multiple sclerosis: Secondary | ICD-10-CM

## 2019-02-11 DIAGNOSIS — Z8669 Personal history of other diseases of the nervous system and sense organs: Secondary | ICD-10-CM | POA: Diagnosis not present

## 2019-02-11 DIAGNOSIS — R261 Paralytic gait: Secondary | ICD-10-CM | POA: Diagnosis not present

## 2019-02-11 MED ORDER — BACLOFEN 10 MG PO TABS: ORAL_TABLET | ORAL | 3 refills | Status: DC

## 2019-02-11 NOTE — Progress Notes (Signed)
[facility]  PATIENT: Alexander [name] DOB: January 03, 1976  REFERRING DOCTOR OR PCP:  Dr. Eleonore Chiquito (PCP)  _________________________________   HISTORICAL  CHIEF COMPLAINT:  Chief Complaint  Patient presents with   Follow-up    RM 13, alone. Last seen 08/13/2018. On lexapro for mood but forgets to take sometimes. Takes baclofen for spasms.    Multiple Sclerosis    On Ocrevus. Last infusion: 10/2018. Due in September 2020. Scheduled for 04/22/2019 at 9am.   Gait Problem    Ambulates with walker.     HISTORY OF PRESENT ILLNESS:  Alexander [name] is a 43 y.o. man with multiple sclerosis diagnosed in 2003 currently on ocrelizumab.     Update 02/11/2019: He has a relapsing form of SPMS and is on Ocrevus.   His last infusion was 10/2018 and will have next infusion 04/23/2019.   He is staying in the house for the most part since the Covid-19 pandemic.    His gait is doing about the same as last visit.   Balance is poor and he needs to use a walker.   He feels his core strength is reduced.     He has left > right leg weakness.   The leg hyperextends as he walks.    He takes baclofen for left leg spasticity.  He has milder weakness in his hands and arms.   If he is more active one day , he feels weaker and has more pain the next day.  Bladder function is ok with some urgency but no incontinence.     Vision is stable and he notes OD blurriness and color desaturation.  He notes fatigue is tolerable but he sometimes needs to take a nap.   He sleeps ok at bedtime and does not snore.     Mood is doing well.   He feels cognition is fine.      Update 08/13/2018: He is on Ocrevus.  He toleates it well but felt he did better on Tysabri.  We had discussed a switch in therapy but he decided to stay on Ocrevus.  He does note hiccups with his infusions.   He would like to stay on Ocrevus in 2020 but consider a different medication if gait continues to worsen.   He has difficulty with his  gait and is using a walker more.     His left side does worse than his right and he has a left foot drop.     He has a couple falls, none were bad.    He notes mild weakness in his hands and arms. He has spasticity but felt weaker on 10 mg baclofen so went back to 5 mg.    No significant numbness or dysesthesia.   Vision is about the same, right worse than left.     Colors washed put OD and V is mildly reduced.  Bladder has been the same with some frequency and urgency but no incontinence.   He is sleeping better with CBD oil and baclofen   6 to 8 hrs.  Fatigue is mild.    Cognition is doing well.   Mood is ok - he remains on escitalopram 20 mg po qod.     He notes a lot of mid back pain and asks about a TPI.  These had helped in in the past (beofre he moved here)  Update 02/10/2018: He is currently on Ocrevus since 2017.  Before that he was on Gilenya and before that  was on Tysabri..  He has tolerated it well but he notes that his progression continues to do about the same on the medication.   He feels he did best on Tysabri but it was stopped because he is JCV positive.   He felt Gilenya was mildly better than Ocrevus.   His last relapse was during the transition from Tysabri to Gilenya 3 years ago.   He also had a period last month where he felt weaker 1-2 days.     His main problems is difficulty is with Leg strength and spasticity.  He has mild weakness in the arms.  His gait and balance are poor.    He continues to have some bladder frequency but no incontinence.  He has some fatigue.    He sleeps well most nights.  Mood is doing well.  Update 08/25/2017: He is scheduled for his next ocrelizumab dose 10/09/2017. He is tolerating it well and there have not been any complications. He has not noted any exacerbations and feels that his MS is stable.  One of his main issues is poor gait due to reduced balance and weakness that is worse on the left. Occasionally he will fall but no injuries. He also has  reduced coordination the hands, worse on the left. For spasticity he takes baclofen with some improvement in the past (takes 3-5 pills a day), he had tried Ampyra but it did not help. He also has truncal dysesthesias which seem better with baclofen.  The dysesthesias are better with exercise also.   He does PT every 2 weeks and goes to a gym.   The hand tremors are very mild now and not too bothersome.    Zonisamide was stopped.    He has urinary frequency with some urgency but no incontinence.   He does note fatigue and sleepiness. He feels much better after he takes a nap. He is able to adapt to the fatigue and does not require medications at this time. Mood is doing well and he continues on Lexapro. He does not have any significant problems with cognition. He is sleeping well.  Follow-up 02/20/2017: MS:   He had  ocrelizumab doses in mid and late August and second infusion was in February and went well. His next one should be in August.     He denies definite exacerbation.    He feels mostly stable.  Gait/strength/sensation: gait is poor due to poor balance and left > right weakness.     He fell to the left earlier today in the office but no LOC.   Coordination is poor in his hands, left is worse.  [name]. He has spasticity in his legs.   He takes 10 mg of baclofen tid with improvement in the spasticity and tightness.  He tolerates it well.  He has hand numness but no leg numbness.     Ampyra made him feel worse so he stopped.      Truncal dyesthesia:    He has started CBD oil and feels pain is better in the flanks and he no longer has a MS hug.    He was able to cut the baclofen to just 10-20 mg daily.    Tremor: He has a tremor in his hands.  He was placed on zonisamide by his previous neurologist but it has not helped much .   The tremor is worse when he is tired and more noticeable with writing.    Bladder: He reports urinary frequency  and urgency but no incontinence.    He has not had a urinary tract  infections.  Vision: He has had optic neuritis in the past in both eyes.  The left eye recovered 100% but the right eyye is not at baseline..   Fatigue/sleep:   He has had a lot more fatigue that is much worse in the hot weather.   He had one episode where he got overheated and legs were much weaker all day.   He generally sleeps well at night.  Mood/cognition: He denies any significant issues with depression or anxiety. He is on Lexapro.  He does not have major problems with cognition though he will have a little bit of mental fog at times. This was best on Tysabri but is better on ocrelizumab than on Gilenya.  MS history: In 2003 he had optic neuritis started on the left and then also on the right. He had an MRI of the brain performed at that time and was told that it was normal. He had follow-up MRIs every year or 2 for several more years but they continue to be normal. In 2012, he noticed worsening issues with gait. He had had some difficulty with the gait for the previous couple of years but due to orthopedic issues a neurologic cause was not looked into. An MRI of the cervical spine showed multiple plaques consistent with MS. Initially, he was placed on Copaxone. Although he tolerated Copaxone well he had some breakthrough disease with a new spot developing in the brain. Therefore he was switched to Tysabri. He tolerated Tysabri well and his MS was well controlled. However, she was JCV antibody positive a decision was made to stop Tysabri after 2 years. Years ago he went on Gilenya. He has tolerated it well and he did better on Tysabri. He was [facility] [place] He had Ocrevus August 2017.      REVIEW OF SYSTEMS: Constitutional: No fevers, chills, sweats, or change in appetite.   Reports minimal fatigue. Eyes: No visual changes, double vision, eye pain Ear, nose and throat: No hearing loss, ear pain, nasal congestion, sore throat Cardiovascular:  No chest pain, palpitations Respiratory: No shortness of breath at rest or with exertion.   No wheezes GastrointestinaI: No nausea, vomiting, diarrhea, abdominal pain, fecal incontinence Genitourinary: reports frequency, hesitancy and nocturia. Musculoskeletal: No neck pain, back pain Integumentary: No rash, pruritus, skin lesions Neurological: as above Psychiatric: No depression at this time.  No anxiety Endocrine: No palpitations, diaphoresis, change in appetite, change in weigh or increased thirst Hematologic/Lymphatic: No anemia, purpura, petechiae. Allergic/Immunologic: No itchy/runny eyes, nasal congestion, recent allergic reactions, rashes  ALLERGIES: No Known Allergies  HOME MEDICATIONS:  Current Outpatient Medications:    Ascorbic Acid (VITA-C PO), Take by mouth., Disp: , Rfl:    B Complex Vitamins (VITAMIN B COMPLEX PO), Take by mouth., Disp: , Rfl:    baclofen (LIORESAL) 10 MG tablet, Take 4 to 5 tablets a day for spasticity, Disp: 450 tablet, Rfl: 3   Cholecalciferol (VITAMIN D PO), Take by mouth. Take 5000-7000 units by mouth daily, Disp: , Rfl:    Coenzyme Q10 (CO Q 10 PO), Take by mouth., Disp: , Rfl:    escitalopram (LEXAPRO) 20 MG tablet, Take 1 tablet (20 mg total) by mouth daily., Disp: 90 tablet, Rfl: 3   MAGNESIUM PO, Take by mouth., Disp: , Rfl:    Multiple Vitamin (MULTIVITAMIN WITH MINERALS) TABS tablet, Take 1 tablet  by mouth daily., Disp: , Rfl:    Nutritional Supplements (JUICE PLUS FIBRE) LIQD, Take 12 capsules by mouth daily. FRUITS AND VEGETABLES AND ANTI-OXIDANTS., Disp: , Rfl:    ocrelizumab 600 mg in sodium chloride 0.9 % 500 mL, Inject 600 mg into the vein every 6 (six) months., Disp: , Rfl:    Omega-3 Fatty Acids (FISH OIL) 1000 MG CAPS, Take 2 capsules by mouth daily., Disp: , Rfl:    UNABLE TO FIND, CBD oil 500mg  once daily, Disp: , Rfl:    UNABLE TO FIND, Take 1 Dose by mouth daily. Med Name: MitoQ, Disp: , Rfl:  No current  facility-administered medications for this visit.   Facility-Administered Medications Ordered in Other Visits:    gadopentetate dimeglumine (MAGNEVIST) injection 17 mL, 17 mL, Intravenous, Once PRN, Melvenia Beam, MD  PAST MEDICAL HISTORY: Past Medical History:  Diagnosis Date   Hypertension    Multiple sclerosis ([place])    Vision abnormalities     PAST SURGICAL HISTORY: Past Surgical History:  Procedure Laterality Date   ACHILLES TENDON SURGERY     ANTERIOR CRUCIATE LIGAMENT REPAIR     MENISCUS REPAIR      FAMILY HISTORY: Family History  Problem Relation Age of Onset   Congestive Heart Failure Father    Stroke Maternal Grandfather     SOCIAL HISTORY:  Social History   Socioeconomic History   Marital status: Divorced    Spouse name: Not on file   Number of children: Not on file   Years of education: Not on file   Highest education level: Not on file  Occupational History   Not on file  Social Needs   Financial resource strain: Not on file   Food insecurity    Worry: Not on file    Inability: Not on file   Transportation needs    Medical: Not on file    Non-medical: Not on file  Tobacco Use   Smoking status: Never Smoker   Smokeless tobacco: Never Used  Substance and Sexual Activity   Alcohol use: No   Drug use: No   Sexual activity: Not on file  Lifestyle   Physical activity    Days per week: Not on file    Minutes per session: Not on file   Stress: Not on file  Relationships   Social connections    Talks on phone: Not on file    Gets together: Not on file    Attends religious service: Not on file    Active member of club or organization: Not on file    Attends meetings of clubs or organizations: Not on file    Relationship status: Not on file   Intimate partner violence    Fear of current or ex partner: Not on file    Emotionally abused: Not on file    Physically abused: Not on file    Forced sexual activity: Not on  file  Other Topics Concern   Not on file  Social History Narrative   Not on file     PHYSICAL EXAM  Vitals:   02/11/19 1523  BP: 133/89  Pulse: 81  Temp: 98.4 F (36.9 C)  Weight: 169 lb (76.7 kg)  Height: 5\' 7"  (1.702 m)    Body mass index is 26.47 kg/m.   General: The patient is well-developed and well-nourished and in no acute distress.   Tender over the lower thoracic paraspinal muscles.   Neurologic Exam  Mental status: The patient  is alert and oriented x 3 at the time of the examination. The patient has apparent normal recent and remote memory, with an apparently normal attention span and concentration ability.   Speech is normal.  Cranial nerves: Extraocular movements exam shows nystagmus on right gaze.  He has reduced color vision out of the right eye.. Facial strength and sensation and trapezius strength were normal.   The tongue is midline, and the patient has symmetric elevation of the soft palate. No obvious hearing deficits are noted.  Motor:  Muscle bulk is normal.  Muscle tone is increased in the legs, left greater than right and in the left arm.   Right arm has normal tone.   He has 4+/5 strength in the left leg and 5 minus/5 strength in the right leg.  Reduced ROM in left arms/hand.  Sensory: He has reduced  Left leg vibration sensation   Coordination: Finger-nose-finger is performed slowly on the left. Heel-to-shin is performed poorly left worse than right.   Gait and station: Station is normal.   The gait is wide and spastic and is better with umilat support.  . He has a left foot drop.  He is unable to do tandem walk.  Romberg is positive.   Reflexes: Deep tendon reflexes are symmetric and increased in legs,with spread at both knees (worse on left) and nonsustained clonus at left ankle.          DIAGNOSTIC DATA (LABS, IMAGING, TESTING) - I reviewed patient records, labs, notes, testing and imaging myself where available.      ASSESSMENT AND  PLAN    1. Multiple sclerosis (HCC)   2. High risk medication use   3. History of optic neuritis   4. Spastic gait      1.    Continue Ocrevus.  Reduce steroids by half as higher dose caused hiccups.    We will check lab work.     We discussed Ocrevus may increase risk of a more severe case of Covid-19 and to follow CDC guidelines to lower risk of infection.   2.   Continue to be active and exercise as tolerated.      Continue Vit D 3.   Continue baclofen for spasticity and  Lexapro for mood 4.    He will return to see me in 6 months for a regular visit or call sooner if there are new or worsening neurologic symptoms.     Ange Puskas A. Epimenio FootSater, MD, PhD, FAAN Certified in Neurology, Clinical Neurophysiology, Sleep Medicine, Pain Medicine and Neuroimaging Director, [facility]  [facility] [address], [address] [place], [place] [address] [phone]

## 2019-03-04 ENCOUNTER — Encounter: Payer: Self-pay | Admitting: Family Medicine

## 2019-04-22 DIAGNOSIS — G35 Multiple sclerosis: Secondary | ICD-10-CM | POA: Diagnosis not present

## 2019-05-10 ENCOUNTER — Other Ambulatory Visit: Payer: Self-pay

## 2019-05-10 ENCOUNTER — Encounter: Payer: Self-pay | Admitting: Family Medicine

## 2019-05-10 ENCOUNTER — Ambulatory Visit (INDEPENDENT_AMBULATORY_CARE_PROVIDER_SITE_OTHER): Payer: Medicare Other | Admitting: Family Medicine

## 2019-05-10 VITALS — BP 110/70 | HR 72 | Temp 98.0°F | Ht 67.0 in | Wt 171.4 lb

## 2019-05-10 DIAGNOSIS — Z23 Encounter for immunization: Secondary | ICD-10-CM

## 2019-05-10 DIAGNOSIS — Z0000 Encounter for general adult medical examination without abnormal findings: Secondary | ICD-10-CM | POA: Diagnosis not present

## 2019-05-10 LAB — HEPATIC FUNCTION PANEL
ALT: 16 U/L (ref 0–53)
AST: 13 U/L (ref 0–37)
Albumin: 4.5 g/dL (ref 3.5–5.2)
Alkaline Phosphatase: 50 U/L (ref 39–117)
Bilirubin, Direct: 0.2 mg/dL (ref 0.0–0.3)
Total Bilirubin: 1 mg/dL (ref 0.2–1.2)
Total Protein: 6.8 g/dL (ref 6.0–8.3)

## 2019-05-10 LAB — POC URINALSYSI DIPSTICK (AUTOMATED)
Glucose, UA: NEGATIVE
Leukocytes, UA: NEGATIVE
Protein, UA: POSITIVE — AB
Spec Grav, UA: 1.025 (ref 1.010–1.025)
Urobilinogen, UA: NEGATIVE E.U./dL — AB
pH, UA: 6 (ref 5.0–8.0)

## 2019-05-10 LAB — CBC WITH DIFFERENTIAL/PLATELET
Basophils Absolute: 0.1 10*3/uL (ref 0.0–0.1)
Basophils Relative: 1.3 % (ref 0.0–3.0)
Eosinophils Absolute: 0.4 10*3/uL (ref 0.0–0.7)
Eosinophils Relative: 7 % — ABNORMAL HIGH (ref 0.0–5.0)
HCT: 45.1 % (ref 39.0–52.0)
Hemoglobin: 15.3 g/dL (ref 13.0–17.0)
Lymphocytes Relative: 16.1 % (ref 12.0–46.0)
Lymphs Abs: 0.9 10*3/uL (ref 0.7–4.0)
MCHC: 33.9 g/dL (ref 30.0–36.0)
MCV: 83.5 fl (ref 78.0–100.0)
Monocytes Absolute: 0.5 10*3/uL (ref 0.1–1.0)
Monocytes Relative: 9.2 % (ref 3.0–12.0)
Neutro Abs: 3.8 10*3/uL (ref 1.4–7.7)
Neutrophils Relative %: 66.4 % (ref 43.0–77.0)
Platelets: 202 10*3/uL (ref 150.0–400.0)
RBC: 5.4 Mil/uL (ref 4.22–5.81)
RDW: 14 % (ref 11.5–15.5)
WBC: 5.8 10*3/uL (ref 4.0–10.5)

## 2019-05-10 LAB — BASIC METABOLIC PANEL
BUN: 17 mg/dL (ref 6–23)
CO2: 31 mEq/L (ref 19–32)
Calcium: 10 mg/dL (ref 8.4–10.5)
Chloride: 104 mEq/L (ref 96–112)
Creatinine, Ser: 0.95 mg/dL (ref 0.40–1.50)
GFR: 85.8 mL/min (ref >60.00)
Glucose, Bld: 77 mg/dL (ref 70–99)
Potassium: 4.3 mEq/L (ref 3.5–5.1)
Sodium: 142 mEq/L (ref 135–145)

## 2019-05-10 LAB — LIPID PANEL
Cholesterol: 248 mg/dL — ABNORMAL HIGH (ref 0–200)
HDL: 38.5 mg/dL — ABNORMAL LOW (ref >39.00)
LDL Cholesterol: 171 mg/dL — ABNORMAL HIGH (ref 0–99)
NonHDL: 209.22
Total CHOL/HDL Ratio: 6
Triglycerides: 189 mg/dL — ABNORMAL HIGH (ref 0.0–149.0)
VLDL: 37.8 mg/dL (ref 0.0–40.0)

## 2019-05-10 LAB — TSH: TSH: 1.47 u[IU]/mL (ref 0.35–4.50)

## 2019-05-10 NOTE — Progress Notes (Signed)
Subjective:    Patient ID: Alexander [name], male    DOB: [birth year], 43 y.o.   MRN: [id number]  HPI Here to establish with me after the retirement of Dr. Burnice Logan, and he needs a well exam. He has no particular complaints today. He was diagnosed with multiple sclerosis in 2003, and he sees Dr. Arlice Colt for neurologic care. He is currently treated with Ocrevus (ocrelizumab) infusions every 6 months. This seems to have slowed the progression on the MS a bit. He has tremors and extremity weakness. Some days he gets around with a cane, other days he requires a walker. He is totally disabled now. As a hobby he enjoys playing and writing music on his computer. He used to play the guitar but this is no longer possible. He is divorced, but he gets to spend a lot of time with his teenage daughter who also lives in [place]. He sees Dr. Cindie Crumbly regularly for dermatologic exams. He takes Ibuprofen occasionally for joint or back pain, and he uses CBD oil. His appetite is good and he sleeps well. He has some overactive bladder, but he does well by making sure to urinate before he leaves his house and he only gets up once a night to urinate. He takes Lexapro for depression and anxiety, and this seems to help a good bit.    Review of Systems  Constitutional: Negative.   HENT: Negative.   Eyes: Negative.   Respiratory: Negative.   Cardiovascular: Negative.   Gastrointestinal: Negative.   Genitourinary: Negative.   Musculoskeletal: Positive for back pain and gait problem.  Skin: Negative.   Neurological: Positive for tremors and weakness.  Psychiatric/Behavioral: Negative.        Objective:   Physical Exam Constitutional:      General: He is not in acute distress.    Appearance: He is well-developed. He is not diaphoretic.     Comments: He walks very slowly with a cane, he has to balance himself by touching the walls   HENT:     Head: Normocephalic and atraumatic.     Right Ear: External  ear normal.     Left Ear: External ear normal.     Nose: Nose normal.     Mouth/Throat:     Pharynx: No oropharyngeal exudate.  Eyes:     General: No scleral icterus.       Right eye: No discharge.        Left eye: No discharge.     Conjunctiva/sclera: Conjunctivae normal.     Pupils: Pupils are equal, round, and reactive to light.  Neck:     Musculoskeletal: Neck supple.     Thyroid: No thyromegaly.     Vascular: No JVD.     Trachea: No tracheal deviation.  Cardiovascular:     Rate and Rhythm: Normal rate and regular rhythm.     Heart sounds: Normal heart sounds. No murmur. No friction rub. No gallop.   Pulmonary:     Effort: Pulmonary effort is normal. No respiratory distress.     Breath sounds: Normal breath sounds. No wheezing or rales.  Chest:     Chest wall: No tenderness.  Abdominal:     General: Bowel sounds are normal. There is no distension.     Palpations: Abdomen is soft. There is no mass.     Tenderness: There is no abdominal tenderness. There is no guarding or rebound.  Genitourinary:    Penis: No tenderness.   Musculoskeletal: Normal  range of motion.        General: No tenderness.  Lymphadenopathy:     Cervical: No cervical adenopathy.  Skin:    General: Skin is warm and dry.     Coloration: Skin is not pale.     Findings: No erythema or rash.     Comments: He has a large dark brown birthmark over the sacrum   Neurological:     Mental Status: He is alert and oriented to person, place, and time.     Cranial Nerves: No cranial nerve deficit.     Motor: No abnormal muscle tone.     Coordination: Coordination normal.     Deep Tendon Reflexes: Reflexes are normal and symmetric. Reflexes normal.  Psychiatric:        Behavior: Behavior normal.        Thought Content: Thought content normal.        Judgment: Judgment normal.           Assessment & Plan:  Well exam. We discussed diet and exercise. Get fasting labs.  [name], MD

## 2019-05-12 ENCOUNTER — Telehealth: Payer: Self-pay | Admitting: Family Medicine

## 2019-05-12 ENCOUNTER — Other Ambulatory Visit: Payer: Self-pay | Admitting: *Deleted

## 2019-05-12 DIAGNOSIS — E7800 Pure hypercholesterolemia, unspecified: Secondary | ICD-10-CM

## 2019-05-12 MED ORDER — ATORVASTATIN CALCIUM 20 MG PO TABS: 20.0000 mg | ORAL_TABLET | Freq: Every day | ORAL | 3 refills | Status: DC

## 2019-05-12 NOTE — Telephone Encounter (Signed)
Copied from [place] [id number]. Topic: Quick Communication - Lab Results (Clinic Use ONLY) >> May 12, 2019  5:29 PM Alverda Skeans, RN wrote: Called patient to inform them of abnormal  lab results. When patient returns call, triage nurse may disclose results. Per Dr. Sarajane Jews Normal except very high cholesterol. Watch a low fat diet, but also start on Lipitor 20 mg daily. Call in #[phone] with 3 rf, and recheck labs in 90 days. Rx has been sent to pharmacy and future lab order placed.

## 2019-05-12 NOTE — Telephone Encounter (Addendum)
Pt given lab results per notes of Dr. Sarajane Jews on 05/11/19. Pt verbalized understanding. Unable to document in result note.

## 2019-05-13 ENCOUNTER — Encounter: Payer: Self-pay | Admitting: Family Medicine

## 2019-05-17 NOTE — Telephone Encounter (Signed)
Some patient can have muscle pains from  Statin drug like Lipitor, but most do very well. I suggest he try it. If he gets any pains, he can let us know and we will stop it. Check repeat labs in 90 days

## 2019-06-17 ENCOUNTER — Telehealth: Payer: Self-pay | Admitting: Neurology

## 2019-06-17 NOTE — Telephone Encounter (Signed)
Pt called wanting to speak to RN stating he woke up today with double vision and he would like to be advised.

## 2019-06-17 NOTE — Telephone Encounter (Signed)
Called patient back. Last two nights have been more difficult and then he woke up with double vision this am. Sx have resolved now. Denies signs of infection. Denies any exposure to anyone w/ Covid-19 and denies any sx of covid-19.  Last Ocrevus 04/2019. I placed on hold and spoke with Dr. Felecia Shelling. Per MD, he would like him to monitor for now and to call back if he has any new or worsening sx. Pt verbalized understanding.

## 2019-06-23 ENCOUNTER — Encounter: Payer: Self-pay | Admitting: Family Medicine

## 2019-06-24 NOTE — Telephone Encounter (Signed)
Please advise 

## 2019-06-25 NOTE — Telephone Encounter (Signed)
Tell him to take only 1/2 tablet (10 mg) a day

## 2019-07-13 DIAGNOSIS — H5203 Hypermetropia, bilateral: Secondary | ICD-10-CM | POA: Diagnosis not present

## 2019-08-12 DIAGNOSIS — Z0120 Encounter for dental examination and cleaning without abnormal findings: Secondary | ICD-10-CM | POA: Diagnosis not present

## 2019-08-17 ENCOUNTER — Other Ambulatory Visit: Payer: Self-pay

## 2019-08-17 ENCOUNTER — Telehealth: Payer: Self-pay | Admitting: *Deleted

## 2019-08-17 ENCOUNTER — Encounter: Payer: Self-pay | Admitting: Neurology

## 2019-08-17 ENCOUNTER — Ambulatory Visit: Payer: Medicare Other | Admitting: Neurology

## 2019-08-17 VITALS — BP 154/86 | HR 80 | Temp 97.1°F | Ht 67.0 in | Wt 170.5 lb

## 2019-08-17 DIAGNOSIS — R39198 Other difficulties with micturition: Secondary | ICD-10-CM | POA: Diagnosis not present

## 2019-08-17 DIAGNOSIS — E559 Vitamin D deficiency, unspecified: Secondary | ICD-10-CM | POA: Diagnosis not present

## 2019-08-17 DIAGNOSIS — Z8669 Personal history of other diseases of the nervous system and sense organs: Secondary | ICD-10-CM | POA: Diagnosis not present

## 2019-08-17 DIAGNOSIS — R261 Paralytic gait: Secondary | ICD-10-CM | POA: Diagnosis not present

## 2019-08-17 DIAGNOSIS — Z79899 Other long term (current) drug therapy: Secondary | ICD-10-CM

## 2019-08-17 DIAGNOSIS — G35 Multiple sclerosis: Secondary | ICD-10-CM

## 2019-08-17 NOTE — Progress Notes (Signed)
[facility]  PATIENT: Alexander [name] DOB: [birth year]  REFERRING DOCTOR OR PCP:  Dr. Bluford Kaufmann (PCP)  _________________________________   HISTORICAL  CHIEF COMPLAINT:  Chief Complaint  Patient presents with  . Follow-up    RM 12, alone. Last seen 02/11/2019. Wants to discuss coming off Ocrevus and starting different DMT. Last Ocrevus infusion: 04/2019.   . Gait Problem    Ambulates with rolling walker.     HISTORY OF PRESENT ILLNESS:  Alexander [name] is a 44 y.o. man with a relapsing/active form of secondary progressive multiple sclerosis diagnosed in 2003 currently on ocrelizumab.     Update 08/17/2019: He is on Ocrevus and feels his relapsing/active form of SPMS has slowly worsened with declining gait.[name] His next Ocrevus infusion will be March 2021.    No exacerbations and no major new symptoms.    He did have one fall and hit head.  His right leg gets tired very quickly and this does seem worse.  His left leg has always been weaker and has a foot drop.  He has a brace but rarely uses it.  Baclofen helps spasticity.    He has mild hand weakness and dexterity issues.   His bladder function is doing better.    His vision is the same -- he has reduced OD vision due to prior ON.   He had one episode of transient diplopia (< 1 hour) that completely resolved.   He has new glasses  He gets fatigued easily.   He sleeps well with 6-7 hours most nights.   He denies depression.   Cognition is doing well.  Update 02/11/2019: He has a relapsing form of SPMS and is on Ocrevus.   His last infusion was 10/2018 and will have next infusion 04/23/2019.   He is staying in the house for the most part since the Covid-19 pandemic.    His gait is doing about the same as last visit.   Balance is poor and he needs to use a walker.   He feels his core strength is reduced.     He has left > right leg weakness.   The leg hyperextends as he walks.    He takes baclofen for left leg spasticity.   He has milder weakness in his hands and arms.   If he is more active one day , he feels weaker and has more pain the next day.  Bladder function is ok with some urgency but no incontinence.     Vision is stable and he notes OD blurriness and color desaturation.  He notes fatigue is tolerable but he sometimes needs to take a nap.   He sleeps ok at bedtime and does not snore.     Mood is doing well.   He feels cognition is fine.      Update 08/13/2018: He is on Ocrevus.  He toleates it well but felt he did better on Tysabri.  We had discussed a switch in therapy but he decided to stay on Ocrevus.  He does note hiccups with his infusions.   He would like to stay on Ocrevus in 2020 but consider a different medication if gait continues to worsen.   He has difficulty with his gait and is using a walker more.     His left side does worse than his right and he has a left foot drop.     He has a couple falls, none were bad.    He notes mild  weakness in his hands and arms. He has spasticity but felt weaker on 10 mg baclofen so went back to 5 mg.    No significant numbness or dysesthesia.   Vision is about the same, right worse than left.     Colors washed put OD and V is mildly reduced.  Bladder has been the same with some frequency and urgency but no incontinence.   He is sleeping better with CBD oil and baclofen   6 to 8 hrs.  Fatigue is mild.    Cognition is doing well.   Mood is ok - he remains on escitalopram 20 mg po qod.     He notes a lot of mid back pain and asks about a TPI.  These had helped in in the past (beofre he moved here)  Update 02/10/2018: He is currently on Ocrevus since 2017.  Before that he was on Gilenya and before that was on Tysabri..  He has tolerated it well but he notes that his progression continues to do about the same on the medication.   He feels he did best on Tysabri but it was stopped because he is JCV positive.   He felt Gilenya was mildly better than Ocrevus.   His last relapse was  during the transition from Tysabri to Gilenya 3 years ago.   He also had a period last month where he felt weaker 1-2 days.     His main problems is difficulty is with Leg strength and spasticity.  He has mild weakness in the arms.  His gait and balance are poor.    He continues to have some bladder frequency but no incontinence.  He has some fatigue.    He sleeps well most nights.  Mood is doing well.  Update 08/25/2017: He is scheduled for his next ocrelizumab dose 10/09/2017. He is tolerating it well and there have not been any complications. He has not noted any exacerbations and feels that his MS is stable.  One of his main issues is poor gait due to reduced balance and weakness that is worse on the left. Occasionally he will fall but no injuries. He also has reduced coordination the hands, worse on the left. For spasticity he takes baclofen with some improvement in the past (takes 3-5 pills a day), he had tried Ampyra but it did not help. He also has truncal dysesthesias which seem better with baclofen.  The dysesthesias are better with exercise also.   He does PT every 2 weeks and goes to a gym.   The hand tremors are very mild now and not too bothersome.    Zonisamide was stopped.    He has urinary frequency with some urgency but no incontinence.   He does note fatigue and sleepiness. He feels much better after he takes a nap. He is able to adapt to the fatigue and does not require medications at this time. Mood is doing well and he continues on Lexapro. He does not have any significant problems with cognition. He is sleeping well.  Follow-up 02/20/2017: MS:   He had  ocrelizumab doses in mid and late August and second infusion was in February and went well. His next one should be in August.     He denies definite exacerbation.    He feels mostly stable.  Gait/strength/sensation: gait is poor due to poor balance and left > right weakness.     He fell to the left earlier today in the office but no  LOC.   Coordination is poor in his hands, left is worse.  [name] He has spasticity in his legs.   He takes 10 mg of baclofen tid with improvement in the spasticity and tightness.  He tolerates it well.  He has hand numness but no leg numbness.     Ampyra made him feel worse so he stopped.      Truncal dyesthesia:    He has started CBD oil and feels pain is better in the flanks and he no longer has a MS hug.    He was able to cut the baclofen to just 10-20 mg daily.    Tremor: He has a tremor in his hands.  He was placed on zonisamide by his previous neurologist but it has not helped much .   The tremor is worse when he is tired and more noticeable with writing.    Bladder: He reports urinary frequency and urgency but no incontinence.    He has not had a urinary tract infections.  Vision: He has had optic neuritis in the past in both eyes.  The left eye recovered 100% but the right eyye is not at baseline..   Fatigue/sleep:   He has had a lot more fatigue that is much worse in the hot weather.   He had one episode where he got overheated and legs were much weaker all day.   He generally sleeps well at night.  Mood/cognition: He denies any significant issues with depression or anxiety. He is on Lexapro.  He does not have major problems with cognition though he will have a little bit of mental fog at times. This was best on Tysabri but is better on ocrelizumab than on Gilenya.  MS history: In 2003 he had optic neuritis started on the left and then also on the right. He had an MRI of the brain performed at that time and was told that it was normal. He had follow-up MRIs every year or 2 for several more years but they continue to be normal. In 2012, he noticed worsening issues with gait. He had had some difficulty with the gait for the previous couple of years but due to orthopedic issues a neurologic cause was not looked into. An MRI of the cervical spine showed multiple plaques consistent with MS. Initially,  he was placed on Copaxone. Although he tolerated Copaxone well he had some breakthrough disease with a new spot developing in the brain. Therefore he was switched to Tysabri. He tolerated Tysabri well and his MS was well controlled. However, she was JCV antibody positive a decision was made to stop Tysabri after 2 years. Years ago he went on Gilenya. He has tolerated it well and he did better on Tysabri. He was seeing Dr. Carroll Sage at [facility] on [address]. He had Ocrevus August 2017.      REVIEW OF SYSTEMS: Constitutional: No fevers, chills, sweats, or change in appetite.   Reports minimal fatigue. Eyes: No visual changes, double vision, eye pain Ear, nose and throat: No hearing loss, ear pain, nasal congestion, sore throat Cardiovascular: No chest pain, palpitations Respiratory: No shortness of breath at rest or with exertion.   No wheezes GastrointestinaI: No nausea, vomiting, diarrhea, abdominal pain, fecal incontinence Genitourinary: reports frequency, hesitancy and nocturia. Musculoskeletal: No neck pain, back pain Integumentary: No rash, pruritus, skin lesions Neurological: as above Psychiatric: No depression at this time.  No anxiety Endocrine: No palpitations, diaphoresis, change in appetite, change in weigh or  increased thirst Hematologic/Lymphatic: No anemia, purpura, petechiae. Allergic/Immunologic: No itchy/runny eyes, nasal congestion, recent allergic reactions, rashes  ALLERGIES: No Known Allergies  HOME MEDICATIONS:  Current Outpatient Medications:  .  Ascorbic Acid (VITA-C PO), Take by mouth., Disp: , Rfl:  .  atorvastatin (LIPITOR) 20 MG tablet, Take 1 tablet (20 mg total) by mouth daily., Disp: 90 tablet, Rfl: 3 .  B Complex Vitamins (VITAMIN B COMPLEX PO), Take by mouth., Disp: , Rfl:  .  baclofen (LIORESAL) 10 MG tablet, Take 4 to 5 tablets a day for spasticity, Disp: 450 tablet, Rfl: 3 .  Cholecalciferol (VITAMIN D PO), Take by mouth. Take  5000-7000 units by mouth daily, Disp: , Rfl:  .  escitalopram (LEXAPRO) 20 MG tablet, Take 1 tablet (20 mg total) by mouth daily., Disp: 90 tablet, Rfl: 3 .  MAGNESIUM PO, Take by mouth., Disp: , Rfl:  .  Multiple Vitamin (MULTIVITAMIN WITH MINERALS) TABS tablet, Take 1 tablet by mouth daily., Disp: , Rfl:  .  Multiple Vitamins-Minerals (PRESERVISION AREDS 2 PO), Take 1 Dose by mouth daily., Disp: , Rfl:  .  ocrelizumab 600 mg in sodium chloride 0.9 % 500 mL, Inject 600 mg into the vein every 6 (six) months., Disp: , Rfl:  .  Omega-3 Fatty Acids (FISH OIL) 1000 MG CAPS, Take 2 capsules by mouth daily., Disp: , Rfl:  .  Probiotic Product (PROBIOTIC PO), Take 3-4 capsules by mouth daily. Nature Made, Disp: , Rfl:  .  UNABLE TO FIND, Take 500-1,000 mg by mouth daily. CBD oil, Disp: , Rfl:  No current facility-administered medications for this visit.  Facility-Administered Medications Ordered in Other Visits:  .  gadopentetate dimeglumine (MAGNEVIST) injection 17 mL, 17 mL, Intravenous, Once PRN, Anson Fret, MD  PAST MEDICAL HISTORY: Past Medical History:  Diagnosis Date  . Hypertension   . Multiple sclerosis (HCC) sees Dr. Despina Arias  . Vision abnormalities     PAST SURGICAL HISTORY: Past Surgical History:  Procedure Laterality Date  . ACHILLES TENDON SURGERY    . ANTERIOR CRUCIATE LIGAMENT REPAIR    . MENISCUS REPAIR      FAMILY HISTORY: Family History  Problem Relation Age of Onset  . Congestive Heart Failure Father   . Stroke Maternal Grandfather     SOCIAL HISTORY:  Social History   Socioeconomic History  . Marital status: Divorced    Spouse name: Not on file  . Number of children: Not on file  . Years of education: Not on file  . Highest education level: Not on file  Occupational History  . Not on file  Tobacco Use  . Smoking status: Never Smoker  . Smokeless tobacco: Never Used  Substance and Sexual Activity  . Alcohol use: No  . Drug use: No  .  Sexual activity: Not on file  Other Topics Concern  . Not on file  Social History Narrative  . Not on file   Social Determinants of Health   Financial Resource Strain:   . Difficulty of Paying Living Expenses: Not on file  Food Insecurity:   . Worried About Programme researcher, broadcasting/film/video in the Last Year: Not on file  . Ran Out of Food in the Last Year: Not on file  Transportation Needs:   . Lack of Transportation (Medical): Not on file  . Lack of Transportation (Non-Medical): Not on file  Physical Activity:   . Days of Exercise per Week: Not on file  . Minutes of Exercise per  Session: Not on file  Stress:   . Feeling of Stress : Not on file  Social Connections:   . Frequency of Communication with Friends and Family: Not on file  . Frequency of Social Gatherings with Friends and Family: Not on file  . Attends Religious Services: Not on file  . Active Member of Clubs or Organizations: Not on file  . Attends Banker Meetings: Not on file  . Marital Status: Not on file  Intimate Partner Violence:   . Fear of Current or Ex-Partner: Not on file  . Emotionally Abused: Not on file  . Physically Abused: Not on file  . Sexually Abused: Not on file     PHYSICAL EXAM  Vitals:   08/17/19 1452  BP: (!) 154/86  Pulse: 80  Temp: (!) 97.1 F (36.2 C)  Weight: 170 lb 8 oz (77.3 kg)  Height:  (1.702 m)    Body mass index is 26.7 kg/m.   General: The patient is well-developed and well-nourished and in no acute distress.      Neurologic Exam  Mental status: The patient is alert and oriented x 3 at the time of the examination. The patient has apparent normal recent and remote memory, with an apparently normal attention span and concentration ability.   Speech is normal.  Cranial nerves: Extraocular movements exam shows nystagmus on right gaze.  He has reduced color vision out of the right eye.. Facial strength and sensation and trapezius strength were normal.   The tongue  is midline, and the patient has symmetric elevation of the soft palate. No obvious hearing deficits are noted.  Motor:  Muscle bulk is normal.  Muscle tone is increased in the legs, left greater than right and in the left arm.   Right arm has normal tone.   He has 4+/5 strength in the left leg and 5-/5 strength in the right leg.  Reduced RAM left hand/arm.  Sensory: He has reduced sensation to vibration in the left leg   Coordination: Finger-nose-finger is performed slowly on the left.  Heel-to-shin is performed poorly on the right and he cannot do on the left.  Gait and station: Station is normal.   The gait is wide and spastic.  He requires support and uses a walker.  He has a left foot drop.  He cannot do tandem walk.  Romberg is positive.    Reflexes: Deep tendon reflexes are symmetric and increased in legs,with spread at both knees (worse on left) and nonsustained clonus at left ankle.          ASSESSMENT AND PLAN    1. Multiple sclerosis (HCC)   2. Spastic gait   3. History of optic neuritis   4. Urinary dysfunction      1.    Stop Ocrevus as he continues to progress.  We discussed options and he would like to start Vumerity.  We also discussed Mayzent or Zeposia.   In the past, he was on Copaxone (relapsed), Gilenya (PML risk), Tysabri (is JCV Ab positive), Ocrevus (continued to progress) 2.   Continue to be active and exercise as tolerated.      Continue Vit D and check level 3.   Continue baclofen for spasticity and  Lexapro for mood 4.    He will return to see me in 6 months for a regular visit or call sooner if there are new or worsening neurologic symptoms.     Iza Preston A. Epimenio Foot, MD, PhD,  FAAN Certified in Neurology, Clinical Neurophysiology, Sleep Medicine, Pain Medicine and Neuroimaging Director, [facility]  [facility] [address] [place], [place] [address] [phone]

## 2019-08-17 NOTE — Telephone Encounter (Signed)
Placed JCV lab in [facility] lock box for routine lab pick up. Results pending. 

## 2019-08-18 ENCOUNTER — Telehealth: Payer: Self-pay | Admitting: *Deleted

## 2019-08-18 NOTE — Telephone Encounter (Signed)
Faxed completed/signed Vumerity start form to [employer] at [phone]. Received fax confirmation. I notified infusion suite that pt is going to discontinue Ocrevus.

## 2019-08-18 NOTE — Telephone Encounter (Signed)
Received fax from [employer] that they received Vumerity start form and processing for pt.

## 2019-08-19 ENCOUNTER — Telehealth: Payer: Self-pay | Admitting: *Deleted

## 2019-08-19 LAB — CBC WITH DIFFERENTIAL/PLATELET
Basophils Absolute: 0.1 10*3/uL (ref 0.0–0.2)
Basos: 2 %
EOS (ABSOLUTE): 0.3 10*3/uL (ref 0.0–0.4)
Eos: 7 %
Hematocrit: 46.6 % (ref 37.5–51.0)
Hemoglobin: 16.4 g/dL (ref 13.0–17.7)
Immature Grans (Abs): 0 10*3/uL (ref 0.0–0.1)
Immature Granulocytes: 0 %
Lymphocytes Absolute: 0.7 10*3/uL (ref 0.7–3.1)
Lymphs: 15 %
MCH: 28.6 pg (ref 26.6–33.0)
MCHC: 35.2 g/dL (ref 31.5–35.7)
MCV: 81 fL (ref 79–97)
Monocytes Absolute: 0.4 10*3/uL (ref 0.1–0.9)
Monocytes: 9 %
Neutrophils Absolute: 3 10*3/uL (ref 1.4–7.0)
Neutrophils: 67 %
Platelets: 195 10*3/uL (ref 150–450)
RBC: 5.73 x10E6/uL (ref 4.14–5.80)
RDW: 13.3 % (ref 11.6–15.4)
WBC: 4.5 10*3/uL (ref 3.4–10.8)

## 2019-08-19 LAB — HEPATIC FUNCTION PANEL
ALT: 24 IU/L (ref 0–44)
AST: 19 IU/L (ref 0–40)
Albumin: 4.6 g/dL (ref 4.0–5.0)
Alkaline Phosphatase: 68 IU/L (ref 39–117)
Bilirubin Total: 0.7 mg/dL (ref 0.0–1.2)
Bilirubin, Direct: 0.19 mg/dL (ref 0.00–0.40)
Total Protein: 6.9 g/dL (ref 6.0–8.5)

## 2019-08-19 LAB — VARICELLA ZOSTER ANTIBODY, IGG: Varicella zoster IgG: 2127 index (ref >165)

## 2019-08-19 LAB — IGG, IGA, IGM
IgA/Immunoglobulin A, Serum: 288 mg/dL (ref 90–386)
IgG (Immunoglobin G), Serum: 856 mg/dL (ref 603–1613)
IgM (Immunoglobulin M), Srm: 40 mg/dL (ref 20–172)

## 2019-08-19 LAB — VITAMIN D 25 HYDROXY (VIT D DEFICIENCY, FRACTURES): Vit D, 25-Hydroxy: 65.8 ng/mL (ref 30.0–100.0)

## 2019-08-19 NOTE — Telephone Encounter (Signed)
-----   Message from Asa Lente, MD sent at 08/19/2019  5:15 PM EST ----- Please let the patient know that the lab work is fine.

## 2019-08-19 NOTE — Telephone Encounter (Addendum)
Submitted PA Vumerity starter pack on CMM. [id number]. Waiting on determination from [employer] [place] [employer].  PA approved effective from 08/19/2019 through 08/18/2020.  Submitted separate PA for maintenance dose of 2 caps po BID #120/30days.Key: [id number] . Waiting on determination. This PA was cx stating it was a duplicate.

## 2019-08-25 NOTE — Telephone Encounter (Signed)
JCV ab drawn on 08/25/19 positive, index: 2.18

## 2019-08-31 ENCOUNTER — Telehealth: Payer: Self-pay | Admitting: *Deleted

## 2019-08-31 NOTE — Telephone Encounter (Signed)
Received fax notification from [employer] that Vumerity has been shipped to pt.

## 2019-09-02 ENCOUNTER — Telehealth: Payer: Self-pay | Admitting: *Deleted

## 2019-09-02 NOTE — Telephone Encounter (Signed)
Gave completed/signed form back to medical records to process for pt. 

## 2019-09-02 NOTE — Telephone Encounter (Signed)
I faxed pt Alexander [name] form on 01/28/21to [phone]

## 2019-09-08 DIAGNOSIS — H21561 Pupillary abnormality, right eye: Secondary | ICD-10-CM | POA: Diagnosis not present

## 2019-09-08 DIAGNOSIS — H53411 Scotoma involving central area, right eye: Secondary | ICD-10-CM | POA: Diagnosis not present

## 2019-09-08 DIAGNOSIS — H47291 Other optic atrophy, right eye: Secondary | ICD-10-CM | POA: Diagnosis not present

## 2019-09-08 DIAGNOSIS — G35 Multiple sclerosis: Secondary | ICD-10-CM | POA: Diagnosis not present

## 2019-09-21 ENCOUNTER — Ambulatory Visit
Admission: RE | Admit: 2019-09-21 | Discharge: 2019-09-21 | Disposition: A | Discharge status: Home / Self Care | Payer: Medicare Other | Source: Ambulatory Visit | Attending: Neurology | Admitting: Neurology

## 2019-09-21 ENCOUNTER — Other Ambulatory Visit: Payer: Self-pay

## 2019-09-21 DIAGNOSIS — G35 Multiple sclerosis: Secondary | ICD-10-CM | POA: Diagnosis not present

## 2019-09-21 MED ORDER — GADOBENATE DIMEGLUMINE 529 MG/ML IV SOLN
16.0000 mL | Freq: Once | INTRAVENOUS | Status: AC | PRN
  Administered 2019-09-21: 16 mL via INTRAVENOUS

## 2019-10-18 ENCOUNTER — Encounter: Payer: Self-pay | Admitting: Family Medicine

## 2019-10-18 ENCOUNTER — Other Ambulatory Visit: Payer: Self-pay

## 2019-10-18 ENCOUNTER — Ambulatory Visit (INDEPENDENT_AMBULATORY_CARE_PROVIDER_SITE_OTHER): Payer: Medicare Other | Admitting: Family Medicine

## 2019-10-18 VITALS — BP 122/80 | HR 68 | Temp 97.2°F | Ht 67.0 in | Wt 174.6 lb

## 2019-10-18 DIAGNOSIS — S76212A Strain of adductor muscle, fascia and tendon of left thigh, initial encounter: Secondary | ICD-10-CM

## 2019-10-18 NOTE — Progress Notes (Signed)
   Subjective:    Patient ID: Alexander [name], male    DOB: Dec 30, 1975, 44 y.o.   MRN: [id number]  HPI Here to check for a possible hernia. He has never had one before. He has recently been doing some exercises to strengthen his lower body, and these have included squats and stair climbs. He developed a sharp pain in the left groin several days ago, but since then it has been easing up day by day. No trouble urinating.    Review of Systems  Constitutional: Negative.   Respiratory: Negative.   Cardiovascular: Negative.   Musculoskeletal: Positive for myalgias.       Objective:   Physical Exam Constitutional:      Comments: Walks with a walker   Cardiovascular:     Rate and Rhythm: Normal rate and regular rhythm.     Pulses: Normal pulses.     Heart sounds: Normal heart sounds.  Pulmonary:     Effort: Pulmonary effort is normal.     Breath sounds: Normal breath sounds.  Genitourinary:    Penis: Normal.      Testes: Normal.     Comments: No hernias are felt. He is tender in the left groin just over the pelvis Neurological:     Mental Status: He is alert.           Assessment & Plan:  Groin strain. This should heal with time. He will rest for a week or so. Once he gets back into exercising, I cautioned him to spend time warming up and stretching before he does anything else.  [name], MD

## 2019-10-20 ENCOUNTER — Telehealth: Payer: Self-pay | Admitting: Neurology

## 2019-10-20 NOTE — Telephone Encounter (Signed)
Rep with [employer] called to touch base in regards the pts dosing schedule for the vumerity and to ensure the pt is on the correct dosing schedule states RN can FU with pt directly

## 2019-10-21 ENCOUNTER — Other Ambulatory Visit: Payer: Self-pay | Admitting: Neurology

## 2020-02-14 ENCOUNTER — Other Ambulatory Visit: Payer: Self-pay | Admitting: Neurology

## 2020-02-22 ENCOUNTER — Encounter: Payer: Self-pay | Admitting: Neurology

## 2020-02-22 ENCOUNTER — Ambulatory Visit: Payer: Medicare Other | Admitting: Neurology

## 2020-02-22 VITALS — BP 130/88 | HR 96 | Ht 67.0 in

## 2020-02-22 DIAGNOSIS — R261 Paralytic gait: Secondary | ICD-10-CM | POA: Diagnosis not present

## 2020-02-22 DIAGNOSIS — G35 Multiple sclerosis: Secondary | ICD-10-CM

## 2020-02-22 DIAGNOSIS — Z8669 Personal history of other diseases of the nervous system and sense organs: Secondary | ICD-10-CM | POA: Diagnosis not present

## 2020-02-22 DIAGNOSIS — H5121 Internuclear ophthalmoplegia, right eye: Secondary | ICD-10-CM

## 2020-02-22 DIAGNOSIS — Z79899 Other long term (current) drug therapy: Secondary | ICD-10-CM | POA: Diagnosis not present

## 2020-02-22 NOTE — Progress Notes (Signed)
[facility]  PATIENT: Alexander [name] DOB: 19-Mar-1976  REFERRING DOCTOR OR PCP:  Dr. Eleonore Chiquito (PCP)  _________________________________   HISTORICAL  CHIEF COMPLAINT:  Chief Complaint  Patient presents with  . Follow-up    RM 13, alone. Last seen 08/17/2019. Ambulates with rolling walker. He requested to be brought back in [facility]. Had to do a lot of walking to get into building.   . Multiple Sclerosis    On Vumerity    HISTORY OF PRESENT ILLNESS:  Alexander [name] is a 44 y.o. man with a relapsing/active form of secondary progressive multiple sclerosis diagnosed in 2003.     Update 02/22/2020: At the last visit, he was switched to Vumerity from [place]., He tolerates it very well.   No stomach upset or flushing (just once).    He had felt lethargic on Ocrevus and has felt better and even stronger on the Vumerity.   Gait and balance are off.  He uses a walker out needs to use  A chair if longer distance.   He has left > right sided weakness in legs more than hands.   He has spasticity and takes baclofen.  He was able to cut the dose some.    Bladder function is fine.   Vision is doing well.     He feels fatigue is better.   He still takes a nap daily.   He sleeps 6-8 hours at night.  Sometimes he is not refreshed in the morning.  He has never been told he snores.    He feels depressed at times but less than earlier in the year.    He is sometimes irritable.   Cognition is doing well.  Update 08/17/2019: He is on Ocrevus and feels his relapsing/active form of SPMS has slowly worsened with declining gait.[name] His next Ocrevus infusion will be March 2021.    No exacerbations and no major new symptoms.    He did have one fall and hit head.  His right leg gets tired very quickly and this does seem worse.  His left leg has always been weaker and has a foot drop.  He has a brace but rarely uses it.  Baclofen helps spasticity.    He has mild hand weakness and dexterity issues.    His bladder function is doing better.    His vision is the same -- he has reduced OD vision due to prior ON.   He had one episode of transient diplopia (< 1 hour) that completely resolved.   He has new glasses  He gets fatigued easily.   He sleeps well with 6-7 hours most nights.   He denies depression.   Cognition is doing well.  Update 02/11/2019: He has a relapsing form of SPMS and is on Ocrevus.   His last infusion was 10/2018 and will have next infusion 04/23/2019.   He is staying in the house for the most part since the Covid-19 pandemic.    His gait is doing about the same as last visit.   Balance is poor and he needs to use a walker.   He feels his core strength is reduced.     He has left > right leg weakness.   The leg hyperextends as he walks.    He takes baclofen for left leg spasticity.  He has milder weakness in his hands and arms.   If he is more active one day , he feels weaker and has more pain the  next day.  Bladder function is ok with some urgency but no incontinence.     Vision is stable and he notes OD blurriness and color desaturation.  He notes fatigue is tolerable but he sometimes needs to take a nap.   He sleeps ok at bedtime and does not snore.     Mood is doing well.   He feels cognition is fine.      Update 08/13/2018: He is on Ocrevus.  He toleates it well but felt he did better on Tysabri.  We had discussed a switch in therapy but he decided to stay on Ocrevus.  He does note hiccups with his infusions.   He would like to stay on Ocrevus in 2020 but consider a different medication if gait continues to worsen.   He has difficulty with his gait and is using a walker more.     His left side does worse than his right and he has a left foot drop.     He has a couple falls, none were bad.    He notes mild weakness in his hands and arms. He has spasticity but felt weaker on 10 mg baclofen so went back to 5 mg.    No significant numbness or dysesthesia.   Vision is about the same, right  worse than left.     Colors washed put OD and V is mildly reduced.  Bladder has been the same with some frequency and urgency but no incontinence.   He is sleeping better with CBD oil and baclofen   6 to 8 hrs.  Fatigue is mild.    Cognition is doing well.   Mood is ok - he remains on escitalopram 20 mg po qod.     He notes a lot of mid back pain and asks about a TPI.  These had helped in in the past (beofre he moved here)  Update 02/10/2018: He is currently on Ocrevus since 2017.  Before that he was on Gilenya and before that was on Tysabri..  He has tolerated it well but he notes that his progression continues to do about the same on the medication.   He feels he did best on Tysabri but it was stopped because he is JCV positive.   He felt Gilenya was mildly better than Ocrevus.   His last relapse was during the transition from Tysabri to Gilenya 3 years ago.   He also had a period last month where he felt weaker 1-2 days.     His main problems is difficulty is with Leg strength and spasticity.  He has mild weakness in the arms.  His gait and balance are poor.    He continues to have some bladder frequency but no incontinence.  He has some fatigue.    He sleeps well most nights.  Mood is doing well.  Update 08/25/2017: He is scheduled for his next ocrelizumab dose 10/09/2017. He is tolerating it well and there have not been any complications. He has not noted any exacerbations and feels that his MS is stable.  One of his main issues is poor gait due to reduced balance and weakness that is worse on the left. Occasionally he will fall but no injuries. He also has reduced coordination the hands, worse on the left. For spasticity he takes baclofen with some improvement in the past (takes 3-5 pills a day), he had tried Ampyra but it did not help. He also has truncal dysesthesias which seem better with  baclofen.  The dysesthesias are better with exercise also.   He does PT every 2 weeks and goes to a gym.   The  hand tremors are very mild now and not too bothersome.    Zonisamide was stopped.    He has urinary frequency with some urgency but no incontinence.   He does note fatigue and sleepiness. He feels much better after he takes a nap. He is able to adapt to the fatigue and does not require medications at this time. Mood is doing well and he continues on Lexapro. He does not have any significant problems with cognition. He is sleeping well.  Follow-up 02/20/2017: MS:   He had  ocrelizumab doses in mid and late August and second infusion was in February and went well. His next one should be in August.     He denies definite exacerbation.    He feels mostly stable.  Gait/strength/sensation: gait is poor due to poor balance and left > right weakness.     He fell to the left earlier today in the office but no LOC.   Coordination is poor in his hands, left is worse.  [name] He has spasticity in his legs.   He takes 10 mg of baclofen tid with improvement in the spasticity and tightness.  He tolerates it well.  He has hand numness but no leg numbness.     Ampyra made him feel worse so he stopped.      Truncal dyesthesia:    He has started CBD oil and feels pain is better in the flanks and he no longer has a MS hug.    He was able to cut the baclofen to just 10-20 mg daily.    Tremor: He has a tremor in his hands.  He was placed on zonisamide by his previous neurologist but it has not helped much .   The tremor is worse when he is tired and more noticeable with writing.    Bladder: He reports urinary frequency and urgency but no incontinence.    He has not had a urinary tract infections.  Vision: He has had optic neuritis in the past in both eyes.  The left eye recovered 100% but the right eyye is not at baseline..   Fatigue/sleep:   He has had a lot more fatigue that is much worse in the hot weather.   He had one episode where he got overheated and legs were much weaker all day.   He generally sleeps well at  night.  Mood/cognition: He denies any significant issues with depression or anxiety. He is on Lexapro.  He does not have major problems with cognition though he will have a little bit of mental fog at times. This was best on Tysabri but is better on ocrelizumab than on Gilenya.  MS history: In 2003 he had optic neuritis started on the left and then also on the right. He had an MRI of the brain performed at that time and was told that it was normal. He had follow-up MRIs every year or 2 for several more years but they continue to be normal. In 2012, he noticed worsening issues with gait. He had had some difficulty with the gait for the previous couple of years but due to orthopedic issues a neurologic cause was not looked into. An MRI of the cervical spine showed multiple plaques consistent with MS. Initially, he was placed on Copaxone. Although he tolerated Copaxone well he had some breakthrough  disease with a new spot developing in the brain. Therefore he was switched to Tysabri. He tolerated Tysabri well and his MS was well controlled. However, she was JCV antibody positive a decision was made to stop Tysabri after 2 years. Years ago he went on Gilenya. He has tolerated it well and he did better on Tysabri. He was seeing Dr. Carroll Sage at [facility] on [address] [place]. He had Ocrevus August 2017.  He switched from Ocrevus to Vumerity January 2021 due to continued progression and fatigue while on Ocrevus.  MRI images MRI of the brain 09/21/2019 showed multiple T2/FLAIR hyperintense foci, mostly in the infratentorial white matter in a pattern and configuration consistent with chronic demyelinating plaque associated with multiple sclerosis.  None of the foci enhances or appears to be acute.  Compared to the MRI dated 02/18/2018, there are no new lesions.  MRI of the cervical spine 09/21/2019 showed patchy hyperintense signal within the spinal cord from C2-C6.  Another small focus is noted at C7-T1  to the right.  Compared to the MRI from 02/18/2018, there are no new lesions.  Additionally, there are multilevel degenerative changes, mostly mild.  No definite nerve root compression or spinal stenosis.    REVIEW OF SYSTEMS: Constitutional: No fevers, chills, sweats, or change in appetite.   Reports minimal fatigue. Eyes: No visual changes, double vision, eye pain Ear, nose and throat: No hearing loss, ear pain, nasal congestion, sore throat Cardiovascular: No chest pain, palpitations Respiratory: No shortness of breath at rest or with exertion.   No wheezes GastrointestinaI: No nausea, vomiting, diarrhea, abdominal pain, fecal incontinence Genitourinary: reports frequency, hesitancy and nocturia. Musculoskeletal: No neck pain, back pain Integumentary: No rash, pruritus, skin lesions Neurological: as above Psychiatric: No depression at this time.  No anxiety Endocrine: No palpitations, diaphoresis, change in appetite, change in weigh or increased thirst Hematologic/Lymphatic: No anemia, purpura, petechiae. Allergic/Immunologic: No itchy/runny eyes, nasal congestion, recent allergic reactions, rashes  ALLERGIES: No Known Allergies  HOME MEDICATIONS:  Current Outpatient Medications:  .  Ascorbic Acid (VITA-C PO), Take by mouth., Disp: , Rfl:  .  atorvastatin (LIPITOR) 20 MG tablet, Take 1 tablet (20 mg total) by mouth daily., Disp: 90 tablet, Rfl: 3 .  B Complex Vitamins (VITAMIN B COMPLEX PO), Take by mouth., Disp: , Rfl:  .  baclofen (LIORESAL) 10 MG tablet, TAKE 4 TO 5 TABLETS A DAY FOR SPASTICITY, Disp: 450 tablet, Rfl: 3 .  Cholecalciferol (VITAMIN D PO), Take by mouth. Take 5000-7000 units by mouth daily, Disp: , Rfl:  .  Diroximel Fumarate (VUMERITY) 231 MG CPDR, Take by mouth., Disp: , Rfl:  .  escitalopram (LEXAPRO) 20 MG tablet, TAKE 1 TABLET BY MOUTH EVERY DAY, Disp: 90 tablet, Rfl: 3 .  MAGNESIUM PO, Take by mouth., Disp: , Rfl:  .  Multiple Vitamin (MULTIVITAMIN WITH  MINERALS) TABS tablet, Take 1 tablet by mouth daily., Disp: , Rfl:  .  Multiple Vitamins-Minerals (PRESERVISION AREDS 2 PO), Take 1 Dose by mouth daily., Disp: , Rfl:  .  Omega-3 Fatty Acids (FISH OIL) 1000 MG CAPS, Take 2 capsules by mouth daily., Disp: , Rfl:  .  Probiotic Product (PROBIOTIC PO), Take 3-4 capsules by mouth daily. Nature Made, Disp: , Rfl:  .  UNABLE TO FIND, Take 500-1,000 mg by mouth daily. CBD oil, Disp: , Rfl:  No current facility-administered medications for this visit.  Facility-Administered Medications Ordered in Other Visits:  .  gadopentetate dimeglumine (MAGNEVIST) injection 17 mL, 17  mL, Intravenous, Once PRN, Anson Fret, MD  PAST MEDICAL HISTORY: Past Medical History:  Diagnosis Date  . Hypertension   . Multiple sclerosis (HCC) sees Dr. Despina Arias  . Vision abnormalities     PAST SURGICAL HISTORY: Past Surgical History:  Procedure Laterality Date  . ACHILLES TENDON SURGERY    . ANTERIOR CRUCIATE LIGAMENT REPAIR    . MENISCUS REPAIR      FAMILY HISTORY: Family History  Problem Relation Age of Onset  . Congestive Heart Failure Father   . Stroke Maternal Grandfather     SOCIAL HISTORY:  Social History   Socioeconomic History  . Marital status: Divorced    Spouse name: Not on file  . Number of children: Not on file  . Years of education: Not on file  . Highest education level: Not on file  Occupational History  . Not on file  Tobacco Use  . Smoking status: Never Smoker  . Smokeless tobacco: Never Used  Substance and Sexual Activity  . Alcohol use: No  . Drug use: No  . Sexual activity: Not on file  Other Topics Concern  . Not on file  Social History Narrative  . Not on file   Social Determinants of Health   Financial Resource Strain:   . Difficulty of Paying Living Expenses:   Food Insecurity:   . Worried About Programme researcher, broadcasting/film/video in the Last Year:   . Barista in the Last Year:   Transportation Needs:   . Sales promotion account executive (Medical):   [name] Lack of Transportation (Non-Medical):   Physical Activity:   . Days of Exercise per Week:   . Minutes of Exercise per Session:   Stress:   . Feeling of Stress :   Social Connections:   . Frequency of Communication with Friends and Family:   . Frequency of Social Gatherings with Friends and Family:   . Attends Religious Services:   . Active Member of Clubs or Organizations:   . Attends Banker Meetings:   [name] Marital Status:   Intimate Partner Violence:   . Fear of Current or Ex-Partner:   . Emotionally Abused:   [name] Physically Abused:   . Sexually Abused:      PHYSICAL EXAM  Vitals:   02/22/20 1445  BP: 130/88  Pulse: 96  SpO2: 98%  Height:  (1.702 m)    Body mass index is 27.35 kg/m.   General: The patient is well-developed and well-nourished and in no acute distress.      Neurologic Exam  Mental status: The patient is alert and oriented x 3 at the time of the examination. The patient has apparent normal recent and remote memory, with an apparently normal attention span and concentration ability.   Speech is normal.  Cranial nerves: Extraocular movements exam shows nystagmus on right gaze c/w INO.[name]  He has reduced color vision out of the right eye.. Facial strength and sensation and trapezius strength were normal.  Hearing appeared to be normal and symmetric. Motor:  Muscle bulk is normal.  Muscle tone is increased in the legs, left greater than right and in the left arm.   Right arm has normal tone.   He has 4+/5 strength in the left leg and 5-/5 strength in the right leg.  Reduced RAM left hand/arm.  Sensory: He has reduced sensation to vibration in the left leg.  Touch sensation was more symmetric.  Coordination: Finger-nose-finger is performed slowly  on the left.  Heel-to-shin is performed poorly on the right and he cannot do on the left.  Gait and station: Station is normal.   The gait is wide and spastic.  He  requires support and uses a walker.  He has a left foot drop.  He cannot do tandem walk.  Romberg is positive.    Reflexes: Deep tendon reflexes are symmetric and increased in legs,with spread at both knees (worse on left) and nonsustained clonus at left ankle.          ASSESSMENT AND PLAN    1. Multiple sclerosis (HCC)   2. High risk medication use   3. Spastic gait   4. History of optic neuritis   5. Internuclear ophthalmoplegia of right eye      1.   Continue Vumerity.  Check labs.   2.   Continue to be active and exercise as tolerated.      Continue Vit D  3.   If hand pain and weakness worsen, check NCV/EMG to determine if significant superimposed  CTS or ulnar neuropathy over the MS.   4.    He will return to see me in 6 months for a regular visit or call sooner if there are new or worsening neurologic symptoms.     Tiyana Galla A. Epimenio Foot, MD, PhD, FAAN Certified in Neurology, Clinical Neurophysiology, Sleep Medicine, Pain Medicine and Neuroimaging Director, [facility] at [facility]  [facility] [address], [address] [place], [place] [address] [phone]

## 2020-02-24 LAB — HEPATIC FUNCTION PANEL
ALT: 40 IU/L (ref 0–44)
AST: 23 IU/L (ref 0–40)
Albumin: 4.7 g/dL (ref 4.0–5.0)
Alkaline Phosphatase: 55 IU/L (ref 48–121)
Bilirubin Total: 0.8 mg/dL (ref 0.0–1.2)
Bilirubin, Direct: 0.22 mg/dL (ref 0.00–0.40)
Total Protein: 6.8 g/dL (ref 6.0–8.5)

## 2020-02-24 LAB — CBC WITH DIFFERENTIAL/PLATELET
Basophils Absolute: 0.1 10*3/uL (ref 0.0–0.2)
Basos: 2 %
EOS (ABSOLUTE): 0.4 10*3/uL (ref 0.0–0.4)
Eos: 7 %
Hematocrit: 44.5 % (ref 37.5–51.0)
Hemoglobin: 15.4 g/dL (ref 13.0–17.7)
Immature Grans (Abs): 0 10*3/uL (ref 0.0–0.1)
Immature Granulocytes: 0 %
Lymphocytes Absolute: 0.7 10*3/uL (ref 0.7–3.1)
Lymphs: 12 %
MCH: 28.3 pg (ref 26.6–33.0)
MCHC: 34.6 g/dL (ref 31.5–35.7)
MCV: 82 fL (ref 79–97)
Monocytes Absolute: 0.5 10*3/uL (ref 0.1–0.9)
Monocytes: 9 %
Neutrophils Absolute: 4.1 10*3/uL (ref 1.4–7.0)
Neutrophils: 70 %
Platelets: 213 10*3/uL (ref 150–450)
RBC: 5.45 x10E6/uL (ref 4.14–5.80)
RDW: 13.4 % (ref 11.6–15.4)
WBC: 5.9 10*3/uL (ref 3.4–10.8)

## 2020-03-10 ENCOUNTER — Encounter: Payer: Self-pay | Admitting: Family Medicine

## 2020-04-18 ENCOUNTER — Other Ambulatory Visit: Payer: Self-pay | Admitting: Family Medicine

## 2020-04-18 DIAGNOSIS — E7800 Pure hypercholesterolemia, unspecified: Secondary | ICD-10-CM

## 2020-05-12 ENCOUNTER — Encounter: Payer: Self-pay | Admitting: Family Medicine

## 2020-05-12 ENCOUNTER — Ambulatory Visit: Payer: Medicare Other | Admitting: Family Medicine

## 2020-05-12 ENCOUNTER — Other Ambulatory Visit: Payer: Self-pay

## 2020-05-12 VITALS — BP 130/90 | HR 81 | Temp 99.1°F | Ht 68.0 in | Wt 169.6 lb

## 2020-05-12 DIAGNOSIS — Z0000 Encounter for general adult medical examination without abnormal findings: Secondary | ICD-10-CM | POA: Diagnosis not present

## 2020-05-12 DIAGNOSIS — Z23 Encounter for immunization: Secondary | ICD-10-CM

## 2020-05-12 NOTE — Addendum Note (Signed)
Addended by: Lerry Liner on: 05/12/2020 02:03 PM   Modules accepted: Orders

## 2020-05-12 NOTE — Progress Notes (Signed)
   Subjective:    Patient ID: Alexander [name], male    DOB: 1976-06-23, 44 y.o.   MRN: [id number]  HPI Here for a well exam. He is doing fairly well, and the MS remains his chief concern. He sees Dr. Epimenio Foot, and he was changed to Vumerity about 6 months ago. He had been deteriorating rapidly before the switch, but now he seems to be slowly regaining some function.   Review of Systems  Constitutional: Negative.   HENT: Negative.   Eyes: Negative.   Respiratory: Negative.   Cardiovascular: Negative.   Gastrointestinal: Negative.   Genitourinary: Negative.   Musculoskeletal: Negative.   Skin: Negative.   Neurological: Positive for weakness.  Psychiatric/Behavioral: Negative.        Objective:   Physical Exam Constitutional:      General: He is not in acute distress.    Appearance: He is well-developed. He is not diaphoretic.     Comments: Walks with a walker   HENT:     Head: Normocephalic and atraumatic.     Right Ear: External ear normal.     Left Ear: External ear normal.     Nose: Nose normal.     Mouth/Throat:     Pharynx: No oropharyngeal exudate.  Eyes:     General: No scleral icterus.       Right eye: No discharge.        Left eye: No discharge.     Conjunctiva/sclera: Conjunctivae normal.     Pupils: Pupils are equal, round, and reactive to light.  Neck:     Thyroid: No thyromegaly.     Vascular: No JVD.     Trachea: No tracheal deviation.  Cardiovascular:     Rate and Rhythm: Normal rate and regular rhythm.     Heart sounds: Normal heart sounds. No murmur heard.  No friction rub. No gallop.   Pulmonary:     Effort: Pulmonary effort is normal. No respiratory distress.     Breath sounds: Normal breath sounds. No wheezing or rales.  Chest:     Chest wall: No tenderness.  Abdominal:     General: Bowel sounds are normal. There is no distension.     Palpations: Abdomen is soft. There is no mass.     Tenderness: There is no abdominal tenderness. There is no  guarding or rebound.  Genitourinary:    Penis: No tenderness.   Musculoskeletal:        General: No tenderness. Normal range of motion.     Cervical back: Neck supple.  Lymphadenopathy:     Cervical: No cervical adenopathy.  Skin:    General: Skin is warm and dry.     Coloration: Skin is not pale.     Findings: No erythema or rash.  Neurological:     Mental Status: He is alert and oriented to person, place, and time.     Cranial Nerves: No cranial nerve deficit.     Motor: No abnormal muscle tone.     Coordination: Coordination normal.     Deep Tendon Reflexes: Reflexes are normal and symmetric. Reflexes normal.  Psychiatric:        Behavior: Behavior normal.        Thought Content: Thought content normal.        Judgment: Judgment normal.           Assessment & Plan:  Well exam. We discussed diet and exercise. Get fasting labs.  [name], MD

## 2020-05-12 NOTE — Addendum Note (Signed)
Addended by: Wilford Corner on: 05/12/2020 05:30 PM   Modules accepted: Orders

## 2020-05-13 ENCOUNTER — Encounter: Payer: Self-pay | Admitting: Family Medicine

## 2020-05-13 LAB — HEPATIC FUNCTION PANEL
AG Ratio: 2.2 (calc) (ref 1.0–2.5)
ALT: 40 U/L (ref 9–46)
AST: 21 U/L (ref 10–40)
Albumin: 4.8 g/dL (ref 3.6–5.1)
Alkaline phosphatase (APISO): 42 U/L (ref 36–130)
Bilirubin, Direct: 0.2 mg/dL (ref 0.0–0.2)
Globulin: 2.2 g/dL (calc) (ref 1.9–3.7)
Indirect Bilirubin: 0.9 mg/dL (calc) (ref 0.2–1.2)
Total Bilirubin: 1.1 mg/dL (ref 0.2–1.2)
Total Protein: 7 g/dL (ref 6.1–8.1)

## 2020-05-13 LAB — CBC WITH DIFFERENTIAL/PLATELET
Absolute Monocytes: 448 cells/uL (ref 200–950)
Basophils Absolute: 90 cells/uL (ref 0–200)
Basophils Relative: 1.6 %
Eosinophils Absolute: 319 cells/uL (ref 15–500)
Eosinophils Relative: 5.7 %
HCT: 46.1 % (ref 38.5–50.0)
Hemoglobin: 15.6 g/dL (ref 13.2–17.1)
Lymphs Abs: 722 cells/uL — ABNORMAL LOW (ref 850–3900)
MCH: 28.4 pg (ref 27.0–33.0)
MCHC: 33.8 g/dL (ref 32.0–36.0)
MCV: 83.8 fL (ref 80.0–100.0)
MPV: 11.1 fL (ref 7.5–12.5)
Monocytes Relative: 8 %
Neutro Abs: 4021 cells/uL (ref 1500–7800)
Neutrophils Relative %: 71.8 %
Platelets: 191 10*3/uL (ref 140–400)
RBC: 5.5 10*6/uL (ref 4.20–5.80)
RDW: 13 % (ref 11.0–15.0)
Total Lymphocyte: 12.9 %
WBC: 5.6 10*3/uL (ref 3.8–10.8)

## 2020-05-13 LAB — BASIC METABOLIC PANEL
BUN: 13 mg/dL (ref 7–25)
CO2: 27 mmol/L (ref 20–32)
Calcium: 9.4 mg/dL (ref 8.6–10.3)
Chloride: 105 mmol/L (ref 98–110)
Creat: 0.86 mg/dL (ref 0.60–1.35)
Glucose, Bld: 76 mg/dL (ref 65–99)
Potassium: 4.1 mmol/L (ref 3.5–5.3)
Sodium: 139 mmol/L (ref 135–146)

## 2020-05-13 LAB — LIPID PANEL
Cholesterol: 170 mg/dL (ref <200)
HDL: 40 mg/dL (ref >=40)
LDL Cholesterol (Calc): 100 mg/dL (calc) — ABNORMAL HIGH
Non-HDL Cholesterol (Calc): 130 mg/dL (calc) — ABNORMAL HIGH (ref <130)
Total CHOL/HDL Ratio: 4.3 (calc) (ref <5.0)
Triglycerides: 187 mg/dL — ABNORMAL HIGH (ref <150)

## 2020-05-13 LAB — VITAMIN D 25 HYDROXY (VIT D DEFICIENCY, FRACTURES): Vit D, 25-Hydroxy: 64 ng/mL (ref 30–100)

## 2020-05-13 LAB — TESTOSTERONE: Testosterone: 101 ng/dL — ABNORMAL LOW (ref 250–827)

## 2020-05-13 LAB — VITAMIN B12: Vitamin B-12: 715 pg/mL (ref 200–1100)

## 2020-05-13 LAB — TSH: TSH: 1.29 mIU/L (ref 0.40–4.50)

## 2020-05-16 ENCOUNTER — Encounter: Payer: Self-pay | Admitting: Family Medicine

## 2020-05-17 NOTE — Telephone Encounter (Signed)
Have him check with his insurance company to see what forms of testosterone they will cover

## 2020-05-17 NOTE — Telephone Encounter (Signed)
Yes the lymphocyte counts are a little low, but that should not be a problem. Yes I want him to stay on the cholesterol medication. The only way to increase testosterone naturally is with aerobic exercise

## 2020-05-18 MED ORDER — TESTOSTERONE 20.25 MG/ACT (1.62%) TD GEL: 4.0000 | Freq: Every day | TRANSDERMAL | 5 refills | Status: DC

## 2020-05-18 NOTE — Telephone Encounter (Signed)
I sent in for the pump gel

## 2020-05-19 ENCOUNTER — Telehealth: Payer: Self-pay | Admitting: *Deleted

## 2020-05-19 NOTE — Telephone Encounter (Signed)
Prior Auth done for Testosterone. Key [id number]

## 2020-05-23 NOTE — Telephone Encounter (Signed)
PA approved from 05/19/2020-05/19/2021.

## 2020-05-24 NOTE — Telephone Encounter (Signed)
This encounter was created in error - please disregard.

## 2020-05-24 NOTE — Telephone Encounter (Signed)
[facility] called and spoke to [place], [facility] and advised the PA is approved for Testosterone Gel.

## 2020-06-05 ENCOUNTER — Other Ambulatory Visit: Payer: Self-pay

## 2020-06-05 ENCOUNTER — Ambulatory Visit (INDEPENDENT_AMBULATORY_CARE_PROVIDER_SITE_OTHER): Payer: Medicare Other

## 2020-06-05 DIAGNOSIS — Z0000 Encounter for general adult medical examination without abnormal findings: Secondary | ICD-10-CM | POA: Diagnosis not present

## 2020-06-05 NOTE — Progress Notes (Signed)
Subjective:   Alexander [name] is a 44 y.o. male who presents for an Initial Medicare Annual Wellness Visit.   I connected with Sadao Weyer today by telephone and verified that I am speaking with the correct person using two identifiers. Location patient: home Location provider: work Persons participating in the virtual visit: patient, provider.   I discussed the limitations, risks, security and privacy concerns of performing an evaluation and management service by telephone and the availability of in person appointments. I also discussed with the patient that there may be a patient responsible charge related to this service. The patient expressed understanding and verbally consented to this telephonic visit.    Interactive audio and video telecommunications were attempted between this provider and patient, however failed, due to patient having technical difficulties OR patient did not have access to video capability.  We continued and completed visit with audio only.     Review of Systems    N/A        Objective:    Today's Vitals   06/05/20 1402  PainSc: 6    There is no height or weight on file to calculate BMI.  Advanced Directives 06/05/2020  Does Patient Have a Medical Advance Directive? No  Would patient like information on creating a medical advance directive? No - Patient declined    Current Medications (verified) Outpatient Encounter Medications as of 06/05/2020  Medication Sig  . Ascorbic Acid (VITA-C PO) Take by mouth.  [name] atorvastatin (LIPITOR) 20 MG tablet TAKE 1 TABLET BY MOUTH EVERY DAY  . B Complex Vitamins (VITAMIN B COMPLEX PO) Take by mouth.  . baclofen (LIORESAL) 10 MG tablet TAKE 4 TO 5 TABLETS A DAY FOR SPASTICITY  . Cholecalciferol (VITAMIN D PO) Take by mouth. Take 5000-7000 units by mouth daily  . Diroximel Fumarate (VUMERITY) 231 MG CPDR Take by mouth.  . escitalopram (LEXAPRO) 20 MG tablet TAKE 1 TABLET BY MOUTH EVERY DAY  . Multiple Vitamin  (MULTIVITAMIN WITH MINERALS) TABS tablet Take 1 tablet by mouth daily.  . Multiple Vitamins-Minerals (PRESERVISION AREDS 2 PO) Take 1 Dose by mouth daily.  . Omega-3 Fatty Acids (FISH OIL) 1000 MG CAPS Take 2 capsules by mouth daily.  . Probiotic Product (PROBIOTIC PO) Take 3-4 capsules by mouth daily. Nature Made  . Testosterone 20.25 MG/ACT (1.62%) GEL Place 4 Pump onto the skin daily in the afternoon.  [name] UNABLE TO FIND Take 500-1,000 mg by mouth daily. CBD oil  . MAGNESIUM PO Take by mouth. (Patient not taking: Reported on 06/05/2020)   Facility-Administered Encounter Medications as of 06/05/2020  Medication  . gadopentetate dimeglumine (MAGNEVIST) injection 17 mL    Allergies (verified) Patient has no known allergies.   History: Past Medical History:  Diagnosis Date  . Hypertension   . Multiple sclerosis (HCC) sees Dr. Despina Arias  . Vision abnormalities    Past Surgical History:  Procedure Laterality Date  . ACHILLES TENDON SURGERY    . ANTERIOR CRUCIATE LIGAMENT REPAIR    . MENISCUS REPAIR     Family History  Problem Relation Age of Onset  . Congestive Heart Failure Father   . Stroke Maternal Grandfather   . Multiple sclerosis Sister    Social History   Socioeconomic History  . Marital status: Divorced    Spouse name: Not on file  . Number of children: Not on file  . Years of education: Not on file  . Highest education level: Not on file  Occupational History  .  Not on file  Tobacco Use  . Smoking status: Never Smoker  . Smokeless tobacco: Never Used  Substance and Sexual Activity  . Alcohol use: No  . Drug use: No  . Sexual activity: Not on file  Other Topics Concern  . Not on file  Social History Narrative  . Not on file   Social Determinants of Health   Financial Resource Strain:   . Difficulty of Paying Living Expenses: Not on file  Food Insecurity:   . Worried About Programme researcher, broadcasting/film/video in the Last Year: Not on file  . Ran Out of Food in the  Last Year: Not on file  Transportation Needs:   . Lack of Transportation (Medical): Not on file  . Lack of Transportation (Non-Medical): Not on file  Physical Activity:   . Days of Exercise per Week: Not on file  . Minutes of Exercise per Session: Not on file  Stress:   . Feeling of Stress : Not on file  Social Connections: Socially Isolated  . Frequency of Communication with Friends and Family: More than three times a week  . Frequency of Social Gatherings with Friends and Family: Twice a week  . Attends Religious Services: Never  . Active Member of Clubs or Organizations: No  . Attends Banker Meetings: Never  . Marital Status: Divorced    Tobacco Counseling Counseling given: Not Answered   Clinical Intake:  Pre-visit preparation completed: Yes  Pain : 0-10 Pain Score: 6  Pain Type: Chronic pain Pain Location: Back (diffused) Pain Descriptors / Indicators: Constant, Aching, Sharp Pain Onset: More than a month ago Pain Frequency: Constant Pain Relieving Factors: Advil, CBD oil  Pain Relieving Factors: Advil, CBD oil  Nutritional Risks: None Diabetes: No  How often do you need to have someone help you when you read instructions, pamphlets, or other written materials from your doctor or pharmacy?: 1 - Never What is the last grade level you completed in school?: Bachelors Degree  Diabetic? No  Interpreter Needed?: No  Information entered by :: SCrews,LPN   Activities of Daily Living In your present state of health, do you have any difficulty performing the following activities: 06/05/2020  Hearing? N  Vision? Y  Walking or climbing stairs? Y  Dressing or bathing? Y  Doing errands, shopping? Y  Preparing Food and eating ? Y  Using the Toilet? N  In the past six months, have you accidently leaked urine? Y  Comment has some occassional bladder leakage  Do you have problems with loss of bowel control? N  Managing your Medications? N  Managing your  Finances? N  Housekeeping or managing your Housekeeping? N  Some recent data might be hidden    Patient Care Team: Nelwyn Salisbury, MD as PCP - General (Family Medicine)  Indicate any recent Medical Services you may have received from other than [facility] providers in the past year (date may be approximate).     Assessment:   This is a routine wellness examination for Lyric.  Hearing/Vision screen  Hearing Screening   125Hz  250Hz  500Hz  1000Hz  2000Hz  3000Hz  4000Hz  6000Hz  8000Hz   Right ear:           Left ear:           Vision Screening Comments: Patient states gets eyes checked twice yearly   Dietary issues and exercise activities discussed: Current Exercise Habits: The patient does not participate in regular exercise at present, Exercise limited by: neurologic condition(s)  Goals    .  Patient Stated     I would like to be able to walk without walker     . 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      Depression Screen PHQ 2/9 Scores 06/05/2020 04/29/2016  PHQ - 2 Score 0 0  PHQ- 9 Score 0 -    Fall Risk Fall Risk  06/05/2020  Falls in the past year? 1  Number falls in past yr: 1  Injury with Fall? 1  Comment hit head with one fall  Risk for fall due to : Impaired mobility;Impaired balance/gait;Impaired vision  Follow up Falls evaluation completed;Falls prevention discussed    Any stairs in or around the home? No  If so, are there any without handrails? No  Home free of loose throw rugs in walkways, pet beds, electrical cords, etc? Yes  Adequate lighting in your home to reduce risk of falls? Yes   ASSISTIVE DEVICES UTILIZED TO PREVENT FALLS:  Life alert? No  Use of a cane, walker or w/c? Yes  Grab bars in the bathroom? Yes  Shower chair or bench in shower? Yes  Elevated toilet seat or a handicapped toilet? No     Cognitive Function:  Cognitive screening not indicated based on direct observation.      Immunizations Immunization  History  Administered Date(s) Administered  . Influenza,inj,Quad PF,6+ Mos 04/29/2016, 04/30/2017, 05/05/2018, 05/10/2019, 05/12/2020  . Influenza-Unspecified 04/11/2018    TDAP status: Due, Education has been provided regarding the importance of this vaccine. Advised may receive this vaccine at local pharmacy or Health Dept. Aware to provide a copy of the vaccination record if obtained from local pharmacy or Health Dept. Verbalized acceptance and understanding. Flu Vaccine status: Up to date Pneumococcal vaccine status: Declined,  Education has been provided regarding the importance of this vaccine but patient still declined. Advised may receive this vaccine at local pharmacy or Health Dept. Aware to provide a copy of the vaccination record if obtained from local pharmacy or Health Dept. Verbalized acceptance and understanding.  Covid-19 vaccine status: Declined, Education has been provided regarding the importance of this vaccine but patient still declined. Advised may receive this vaccine at local pharmacy or Health Dept.or vaccine clinic. Aware to provide a copy of the vaccination record if obtained from local pharmacy or Health Dept. Verbalized acceptance and understanding.  Qualifies for Shingles Vaccine? No   Zostavax completed No   Shingrix Completed?: No.    Education has been provided regarding the importance of this vaccine. Patient has been advised to call insurance company to determine out of pocket expense if they have not yet received this vaccine. Advised may also receive vaccine at local pharmacy or Health Dept. Verbalized acceptance and understanding.  Screening Tests Health Maintenance  Topic Date Due  . TETANUS/TDAP  Never done  . INFLUENZA VACCINE  Completed  . Hepatitis C Screening  Completed  . HIV Screening  Completed    Health Maintenance  Health Maintenance Due  Topic Date Due  . TETANUS/TDAP  Never done    Colorectal cancer screening: Not required at this  visit  Lung Cancer Screening: (Low Dose CT Chest recommended if Age 42-80 years, 30 pack-year currently smoking OR have quit w/in 15years.) does not qualify.   Lung Cancer Screening Referral: N/A   Additional Screening:  Hepatitis C Screening: does qualify; Completed 02/10/2018  Vision Screening: Recommended annual ophthalmology exams for early detection of glaucoma and other disorders  of the eye. Is the patient up to date with their annual eye exam?  Yes  Who is the provider or what is the name of the office in which the patient attends annual eye exams? [facility] If pt is not established with a provider, would they like to be referred to a provider to establish care? No .   Dental Screening: Recommended annual dental exams for proper oral hygiene  Community Resource Referral / Chronic Care Management: CRR required this visit?  No   CCM required this visit?  No      Plan:     I have personally reviewed and noted the following in the patient's chart:   . Medical and social history . Use of alcohol, tobacco or illicit drugs  . Current medications and supplements . Functional ability and status . Nutritional status . Physical activity . Advanced directives . List of other physicians . Hospitalizations, surgeries, and ER visits in previous 12 months . Vitals . Screenings to include cognitive, depression, and falls . Referrals and appointments  In addition, I have reviewed and discussed with patient certain preventive protocols, quality metrics, and best practice recommendations. A written personalized care plan for preventive services as well as general preventive health recommendations were provided to patient.     Theodora Blow, LPN   16/08/958   Nurse Notes: None

## 2020-06-05 NOTE — Patient Instructions (Signed)
Alexander [name] , Thank you for taking time to come for your Medicare Wellness Visit. I appreciate your ongoing commitment to your health goals. Please review the following plan we discussed and let me know if I can assist you in the future.   Screening recommendations/referrals: Colonoscopy: Not required until age 47 Recommended yearly ophthalmology/optometry visit for glaucoma screening and checkup Recommended yearly dental visit for hygiene and checkup  Vaccinations: Influenza vaccine: Up to date, next due fall 2022 Pneumococcal vaccine: Not required at this age Tdap vaccine: Currently due, you may receive at your next in person office visit Shingles vaccine: Not required until age 8    Advanced directives: Advance directive discussed with you today. Even though you declined this today please call our office should you change your mind and we can give you the proper paperwork for you to fill out.   Conditions/risks identified: None   Next appointment: 06/06/2021 @ 2:00 PM with Carollee Herter Nurse Health Advisor   Preventive Care 40-64 Years, Male Preventive care refers to lifestyle choices and visits with your health care provider that can promote health and wellness. What does preventive care include?  A yearly physical exam. This is also called an annual well check.  Dental exams once or twice a year.  Routine eye exams. Ask your health care provider how often you should have your eyes checked.  Personal lifestyle choices, including:  Daily care of your teeth and gums.  Regular physical activity.  Eating a healthy diet.  Avoiding tobacco and drug use.  Limiting alcohol use.  Practicing safe sex.  Taking low-dose aspirin every day starting at age 19. What happens during an annual well check? The services and screenings done by your health care provider during your annual well check will depend on your age, overall health, lifestyle risk factors, and family history of  disease. Counseling  Your health care provider may ask you questions about your:  Alcohol use.  Tobacco use.  Drug use.  Emotional well-being.  Home and relationship well-being.  Sexual activity.  Eating habits.  Work and work Astronomer. Screening  You may have the following tests or measurements:  Height, weight, and BMI.  Blood pressure.  Lipid and cholesterol levels. These may be checked every 5 years, or more frequently if you are over 70 years old.  Skin check.  Lung cancer screening. You may have this screening every year starting at age 70 if you have a 30-pack-year history of smoking and currently smoke or have quit within the past 15 years.  Fecal occult blood test (FOBT) of the stool. You may have this test every year starting at age 51.  Flexible sigmoidoscopy or colonoscopy. You may have a sigmoidoscopy every 5 years or a colonoscopy every 10 years starting at age 75.  Prostate cancer screening. Recommendations will vary depending on your family history and other risks.  Hepatitis C blood test.  Hepatitis B blood test.  Sexually transmitted disease (STD) testing.  Diabetes screening. This is done by checking your blood sugar (glucose) after you have not eaten for a while (fasting). You may have this done every 1-3 years. Discuss your test results, treatment options, and if necessary, the need for more tests with your health care provider. Vaccines  Your health care provider may recommend certain vaccines, such as:  Influenza vaccine. This is recommended every year.  Tetanus, diphtheria, and acellular pertussis (Tdap, Td) vaccine. You may need a Td booster every 10 years.  Zoster vaccine. You  may need this after age 105.  Pneumococcal 13-valent conjugate (PCV13) vaccine. You may need this if you have certain conditions and have not been vaccinated.  Pneumococcal polysaccharide (PPSV23) vaccine. You may need one or two doses if you smoke cigarettes  or if you have certain conditions. Talk to your health care provider about which screenings and vaccines you need and how often you need them. This information is not intended to replace advice given to you by your health care provider. Make sure you discuss any questions you have with your health care provider. Document Released: 08/18/2015 Document Revised: 04/10/2016 Document Reviewed: 05/23/2015 Elsevier Interactive Patient Education  2017 [employer].  Fall Prevention in the Home Falls can cause injuries. They can happen to people of all ages. There are many things you can do to make your home safe and to help prevent falls. What can I do on the outside of my home?  Regularly fix the edges of walkways and driveways and fix any cracks.  Remove anything that might make you trip as you walk through a door, such as a raised step or threshold.  Trim any bushes or trees on the path to your home.  Use bright outdoor lighting.  Clear any walking paths of anything that might make someone trip, such as rocks or tools.  Regularly check to see if handrails are loose or broken. Make sure that both sides of any steps have handrails.  Any raised decks and porches should have guardrails on the edges.  Have any leaves, snow, or ice cleared regularly.  Use sand or salt on walking paths during winter.  Clean up any spills in your garage right away. This includes oil or grease spills. What can I do in the bathroom?  Use night lights.  Install grab bars by the toilet and in the tub and shower. Do not use towel bars as grab bars.  Use non-skid mats or decals in the tub or shower.  If you need to sit down in the shower, use a plastic, non-slip stool.  Keep the floor dry. Clean up any water that spills on the floor as soon as it happens.  Remove soap buildup in the tub or shower regularly.  Attach bath mats securely with double-sided non-slip rug tape.  Do not have throw rugs and other  things on the floor that can make you trip. What can I do in the bedroom?  Use night lights.  Make sure that you have a light by your bed that is easy to reach.  Do not use any sheets or blankets that are too big for your bed. They should not hang down onto the floor.  Have a firm chair that has side arms. You can use this for support while you get dressed.  Do not have throw rugs and other things on the floor that can make you trip. What can I do in the kitchen?  Clean up any spills right away.  Avoid walking on wet floors.  Keep items that you use a lot in easy-to-reach places.  If you need to reach something above you, use a strong step stool that has a grab bar.  Keep electrical cords out of the way.  Do not use floor polish or wax that makes floors slippery. If you must use wax, use non-skid floor wax.  Do not have throw rugs and other things on the floor that can make you trip. What can I do with my stairs?  Do not leave any items on the stairs.  Make sure that there are handrails on both sides of the stairs and use them. Fix handrails that are broken or loose. Make sure that handrails are as long as the stairways.  Check any carpeting to make sure that it is firmly attached to the stairs. Fix any carpet that is loose or worn.  Avoid having throw rugs at the top or bottom of the stairs. If you do have throw rugs, attach them to the floor with carpet tape.  Make sure that you have a light switch at the top of the stairs and the bottom of the stairs. If you do not have them, ask someone to add them for you. What else can I do to help prevent falls?  Wear shoes that:  Do not have high heels.  Have rubber bottoms.  Are comfortable and fit you well.  Are closed at the toe. Do not wear sandals.  If you use a stepladder:  Make sure that it is fully opened. Do not climb a closed stepladder.  Make sure that both sides of the stepladder are locked into place.  Ask  someone to hold it for you, if possible.  Clearly mark and make sure that you can see:  Any grab bars or handrails.  First and last steps.  Where the edge of each step is.  Use tools that help you move around (mobility aids) if they are needed. These include:  Canes.  Walkers.  Scooters.  Crutches.  Turn on the lights when you go into a dark area. Replace any light bulbs as soon as they burn out.  Set up your furniture so you have a clear path. Avoid moving your furniture around.  If any of your floors are uneven, fix them.  If there are any pets around you, be aware of where they are.  Review your medicines with your doctor. Some medicines can make you feel dizzy. This can increase your chance of falling. Ask your doctor what other things that you can do to help prevent falls. This information is not intended to replace advice given to you by your health care provider. Make sure you discuss any questions you have with your health care provider. Document Released: 05/18/2009 Document Revised: 12/28/2015 Document Reviewed: 08/26/2014 Elsevier Interactive Patient Education  2017 [employer].

## 2020-08-28 ENCOUNTER — Ambulatory Visit: Payer: Medicare Other | Admitting: Family Medicine

## 2020-08-28 ENCOUNTER — Encounter: Payer: Self-pay | Admitting: Family Medicine

## 2020-08-28 ENCOUNTER — Telehealth: Payer: Self-pay | Admitting: *Deleted

## 2020-08-28 VITALS — BP 145/97 | HR 102 | Ht 68.0 in | Wt 170.0 lb

## 2020-08-28 DIAGNOSIS — Z79899 Other long term (current) drug therapy: Secondary | ICD-10-CM

## 2020-08-28 DIAGNOSIS — R261 Paralytic gait: Secondary | ICD-10-CM | POA: Diagnosis not present

## 2020-08-28 DIAGNOSIS — G35 Multiple sclerosis: Secondary | ICD-10-CM | POA: Diagnosis not present

## 2020-08-28 DIAGNOSIS — E559 Vitamin D deficiency, unspecified: Secondary | ICD-10-CM

## 2020-08-28 MED ORDER — ESCITALOPRAM OXALATE 20 MG PO TABS: 20.0000 mg | ORAL_TABLET | Freq: Every day | ORAL | 3 refills | Status: DC

## 2020-08-28 MED ORDER — BACLOFEN 10 MG PO TABS: ORAL_TABLET | ORAL | 3 refills | Status: DC

## 2020-08-28 NOTE — Progress Notes (Signed)
I have read the note, and I agree with the clinical assessment and plan.  Abilene Mcphee A. Sudie Bandel, MD, PhD, FAAN Certified in Neurology, Clinical Neurophysiology, Sleep Medicine, Pain Medicine and Neuroimaging  [facility] [address], [address] [place], [place] [address] [phone]  

## 2020-08-28 NOTE — Telephone Encounter (Signed)
Pt only has about 2 wks of med left for vumerity. I reached out to [employer]. ([employer] rep) to make her aware.  She called and LVM for pt today. Per Crystal: "I'll continue to follow up to make sure he is aware of the process & screened for the Free Drug program so he does not run out of medication.".

## 2020-08-28 NOTE — Progress Notes (Signed)
Chief Complaint  Patient presents with  . Follow-up    Rm 1 alone Pt is doing, feels like he is making rogress     HISTORY OF PRESENT ILLNESS: Today 08/28/20  Cas Tracz is a 45 y.o. male here today for follow up for relapsing/active SPMS.   He feels that he continues to do better on Vumerity. Legs seem a little stronger. He does not feel that he hyperextends legs as frequently. He feels less stiff. He is taking baclofen much less frequently, may take 5-10 per month. He continues to use Rolator with short distances and wheelchair with longer distances. He is in wheelchair today.   He feels mood is stable. He continues escitalopram. He is sleeping well. No changes in vision, bowel or bladder habits.   He does have concerns of continuing Vumerity as patient assistance program is ending. He has been in contact with [employer] for assistance with cost. He has not yet heard of any new grant options available.   HISTORY (copied from Dr Bonnita Hollow previous note)  Kendale Rembold is a 45 y.o. man with a relapsing/active form of secondary progressive multiple sclerosis diagnosed in 2003.     Update 02/22/2020: At the last visit, he was switched to Vumerity from [place]., He tolerates it very well.   No stomach upset or flushing (just once).    He had felt lethargic on Ocrevus and has felt better and even stronger on the Vumerity.   Gait and balance are off.  He uses a walker out needs to use  A chair if longer distance.   He has left > right sided weakness in legs more than hands.   He has spasticity and takes baclofen.  He was able to cut the dose some.    Bladder function is fine.   Vision is doing well.     He feels fatigue is better.   He still takes a nap daily.   He sleeps 6-8 hours at night.  Sometimes he is not refreshed in the morning.  He has never been told he snores.    He feels depressed at times but less than earlier in the year.    He is sometimes irritable.   Cognition is doing  well.  Update 08/17/2019: He is on Ocrevus and feels his relapsing/active form of SPMS has slowly worsened with declining gait.[name] His next Ocrevus infusion will be March 2021.    No exacerbations and no major new symptoms.    He did have one fall and hit head.  His right leg gets tired very quickly and this does seem worse.  His left leg has always been weaker and has a foot drop.  He has a brace but rarely uses it.  Baclofen helps spasticity.    He has mild hand weakness and dexterity issues.   His bladder function is doing better.    His vision is the same -- he has reduced OD vision due to prior ON.   He had one episode of transient diplopia (< 1 hour) that completely resolved.   He has new glasses  He gets fatigued easily.   He sleeps well with 6-7 hours most nights.   He denies depression.   Cognition is doing well.  Update 02/11/2019: He has a relapsing form of SPMS and is on Ocrevus.   His last infusion was 10/2018 and will have next infusion 04/23/2019.   He is staying in the house for the most part since  the Covid-19 pandemic.    His gait is doing about the same as last visit.   Balance is poor and he needs to use a walker.   He feels his core strength is reduced.     He has left > right leg weakness.   The leg hyperextends as he walks.    He takes baclofen for left leg spasticity.  He has milder weakness in his hands and arms.   If he is more active one day , he feels weaker and has more pain the next day.  Bladder function is ok with some urgency but no incontinence.     Vision is stable and he notes OD blurriness and color desaturation.  He notes fatigue is tolerable but he sometimes needs to take a nap.   He sleeps ok at bedtime and does not snore.     Mood is doing well.   He feels cognition is fine.      Update 08/13/2018: He is on Ocrevus.  He toleates it well but felt he did better on Tysabri.  We had discussed a switch in therapy but he decided to stay on Ocrevus.  He does note hiccups  with his infusions.   He would like to stay on Ocrevus in 2020 but consider a different medication if gait continues to worsen.   He has difficulty with his gait and is using a walker more.     His left side does worse than his right and he has a left foot drop.     He has a couple falls, none were bad.    He notes mild weakness in his hands and arms. He has spasticity but felt weaker on 10 mg baclofen so went back to 5 mg.    No significant numbness or dysesthesia.   Vision is about the same, right worse than left.     Colors washed put OD and V is mildly reduced.  Bladder has been the same with some frequency and urgency but no incontinence.   He is sleeping better with CBD oil and baclofen   6 to 8 hrs.  Fatigue is mild.    Cognition is doing well.   Mood is ok - he remains on escitalopram 20 mg po qod.     He notes a lot of mid back pain and asks about a TPI.  These had helped in in the past (beofre he moved here)   REVIEW OF SYSTEMS: Out of a complete 14 system review of symptoms, the patient complains only of the following symptoms, weakness, dysesthesias, imbalance, spasticity, depression and all other reviewed systems are negative.   ALLERGIES: No Known Allergies   HOME MEDICATIONS: Outpatient Medications Prior to Visit  Medication Sig Dispense Refill  . Ascorbic Acid (VITA-C PO) Take by mouth.    [name] atorvastatin (LIPITOR) 20 MG tablet TAKE 1 TABLET BY MOUTH EVERY DAY 90 tablet 3  . B Complex Vitamins (VITAMIN B COMPLEX PO) Take by mouth.    . Cholecalciferol (VITAMIN D PO) Take by mouth. Take 5000-7000 units by mouth daily    . Diroximel Fumarate (VUMERITY) 231 MG CPDR Take by mouth.    [name] MAGNESIUM PO Take by mouth.    . Multiple Vitamin (MULTIVITAMIN WITH MINERALS) TABS tablet Take 1 tablet by mouth daily.    . Multiple Vitamins-Minerals (PRESERVISION AREDS 2 PO) Take 1 Dose by mouth daily.    . Omega-3 Fatty Acids (FISH OIL) 1000 MG CAPS Take 2  capsules by mouth daily.    .  Probiotic Product (PROBIOTIC PO) Take 3-4 capsules by mouth daily. Nature Made    . Testosterone 20.25 MG/ACT (1.62%) GEL Place 4 Pump onto the skin daily in the afternoon. (Patient taking differently: Place 2 Pump onto the skin daily in the afternoon.) 75 g 5  . UNABLE TO FIND Take 500-1,000 mg by mouth daily. CBD oil    . baclofen (LIORESAL) 10 MG tablet TAKE 4 TO 5 TABLETS A DAY FOR SPASTICITY 450 tablet 3  . escitalopram (LEXAPRO) 20 MG tablet TAKE 1 TABLET BY MOUTH EVERY DAY 90 tablet 3   Facility-Administered Medications Prior to Visit  Medication Dose Route Frequency Provider Last Rate Last Admin  . gadopentetate dimeglumine (MAGNEVIST) injection 17 mL  17 mL Intravenous Once PRN Anson Fret, MD         PAST MEDICAL HISTORY: Past Medical History:  Diagnosis Date  . Hypertension   . Multiple sclerosis (HCC) sees Dr. Despina Arias  . Vision abnormalities      PAST SURGICAL HISTORY: Past Surgical History:  Procedure Laterality Date  . ACHILLES TENDON SURGERY    . ANTERIOR CRUCIATE LIGAMENT REPAIR    . MENISCUS REPAIR       FAMILY HISTORY: Family History  Problem Relation Age of Onset  . Congestive Heart Failure Father   . Stroke Maternal Grandfather   . Multiple sclerosis Sister      SOCIAL HISTORY: Social History   Socioeconomic History  . Marital status: Divorced    Spouse name: Not on file  . Number of children: Not on file  . Years of education: Not on file  . Highest education level: Not on file  Occupational History  . Not on file  Tobacco Use  . Smoking status: Never Smoker  . Smokeless tobacco: Never Used  Substance and Sexual Activity  . Alcohol use: No  . Drug use: No  . Sexual activity: Not on file  Other Topics Concern  . Not on file  Social History Narrative  . Not on file   Social Determinants of Health   Financial Resource Strain: Not on file  Food Insecurity: Not on file  Transportation Needs: Not on file  Physical Activity:  Not on file  Stress: Not on file  Social Connections: Socially Isolated  . Frequency of Communication with Friends and Family: More than three times a week  . Frequency of Social Gatherings with Friends and Family: Twice a week  . Attends Religious Services: Never  . Active Member of Clubs or Organizations: No  . Attends Banker Meetings: Never  . Marital Status: Divorced  Catering manager Violence: Not on file      PHYSICAL EXAM  Vitals:   08/28/20 1452  BP: (!) 145/97  Pulse: (!) 102  Weight: 170 lb (77.1 kg)  Height: 5\' 8"  (1.727 m)   Body mass index is 25.85 kg/m.   Generalized: Well developed, in no acute distress  Cardiology: normal rate and rhythm, no murmur auscultated  Respiratory: clear to auscultation bilaterally    Neurological examination  Mentation: Alert oriented to time, place, history taking. Follows all commands speech and language fluent Cranial nerve II-XII: Pupils were equal round reactive to light. Extraocular movements were full, visual field were full on confrontational test. Facial sensation and strength were normal. Head turning and shoulder shrug  were normal and symmetric. Motor: The motor testing reveals 5 over 5 strength of bilateral upper extremities, 3/5  bilateral lower hip flexion, 5/5 bilateral lower flexion and extension.   Sensory: Sensory testing is intact to soft touch on all 4 extremities. No evidence of extinction is noted.  Coordination: Cerebellar testing reveals slow finger-nose-finger, unable to perform HTS bilaterally  Gait and station: Gait not assessed today due to patient request  Reflexes: Deep tendon reflexes are symmetric and brisk in bilateral lower extremities.     DIAGNOSTIC DATA (LABS, IMAGING, TESTING) - I reviewed patient records, labs, notes, testing and imaging myself where available.  Lab Results  Component Value Date   WBC 5.6 05/12/2020   HGB 15.6 05/12/2020   HCT 46.1 05/12/2020   MCV 83.8  05/12/2020   PLT 191 05/12/2020      Component Value Date/Time   NA 139 05/12/2020 1402   NA 142 02/10/2018 1438   K 4.1 05/12/2020 1402   CL 105 05/12/2020 1402   CO2 27 05/12/2020 1402   GLUCOSE 76 05/12/2020 1402   BUN 13 05/12/2020 1402   BUN 12 02/10/2018 1438   CREATININE 0.86 05/12/2020 1402   CALCIUM 9.4 05/12/2020 1402   PROT 7.0 05/12/2020 1402   PROT 6.8 02/22/2020 1545   ALBUMIN 4.7 02/22/2020 1545   AST 21 05/12/2020 1402   ALT 40 05/12/2020 1402   ALKPHOS 55 02/22/2020 1545   BILITOT 1.1 05/12/2020 1402   BILITOT 0.8 02/22/2020 1545   GFRNONAA 100 02/10/2018 1438   GFRAA 116 02/10/2018 1438   Lab Results  Component Value Date   CHOL 170 05/12/2020   HDL 40 05/12/2020   LDLCALC 100 (H) 05/12/2020   LDLDIRECT 164.0 05/05/2018   TRIG 187 (H) 05/12/2020   CHOLHDL 4.3 05/12/2020   No results found for: HGBA1C Lab Results  Component Value Date   VITAMINB12 715 05/12/2020   Lab Results  Component Value Date   TSH 1.29 05/12/2020    No flowsheet data found.   ASSESSMENT AND PLAN  45 y.o. year old male  has a past medical history of Hypertension, Multiple sclerosis (HCC) (sees Dr. Despina Arias), and Vision abnormalities. here with   Multiple sclerosis (HCC)  High risk medication use  Spastic gait  Vitamin D deficiency  Brett Canales is fairly stable currently.  He feels that he has less stiffness and weakness on Vumerity.  Labs were reviewed from 05/2020 with PCP.  We have discussed concerns of cost of medication.  We have reached out to Crystal with [employer] to assist patient in obtaining continued assistance.  She will be calling patient and he is aware.  He will continue baclofen as needed for spasticity.  Continue escitalopram for mood management.  Healthy lifestyle habits encouraged.  Fall precautions reviewed.  He will follow-up in 6 months with Dr. Epimenio Foot.   No orders of the defined types were placed in this encounter.     I spent 30 minutes of  face-to-face and non-face-to-face time with patient.  This included previsit chart review, lab review, study review, order entry, electronic health record documentation, patient education.    Shawnie Dapper, MSN, FNP-C 08/28/2020, 4:37 PM  [facility] [address] [place], [place] [address] [phone]

## 2020-08-28 NOTE — Patient Instructions (Signed)
Below is our plan:  We will continue current treatment plan. We have contacted our representative with [employer] to see if they have any options to continue financial support with Vumerity. If we need to switch DMT, I will talk with Dr Epimenio Foot and get back to you. Please let us know if you need [place].   Please make sure you are staying well hydrated. I recommend 50-60 ounces daily. Well balanced diet and regular exercise encouraged.    Please continue follow up with care team as directed.   Follow up with Dr Epimenio Foot in 6 months   You may receive a survey regarding today's visit. I encourage you to leave honest feed back as I do use this information to improve patient care. Thank you for seeing me today!      Multiple Sclerosis Multiple sclerosis (MS) is a disease of the brain, spinal cord, and optic nerves (central nervous system). It causes the body's disease-fighting (immune) system to destroy the protective covering (myelin sheath) around nerves in the brain. When this happens, signals (nerve impulses) going to and from the brain and spinal cord do not get sent properly or may not get sent at all. There are several types of MS:  Relapsing-remitting MS. This is the most common type. This causes sudden attacks of symptoms. After an attack, you may recover completely until the next attack, or some symptoms may remain permanently.  Secondary progressive MS. This usually develops after the onset of relapsing-remitting MS. Similar to relapsing-remitting MS, this type also causes sudden attacks of symptoms. Attacks may be less frequent, but symptoms slowly get worse (progress) over time.  Primary progressive MS. This causes symptoms that steadily progress over time. This type of MS does not cause sudden attacks of symptoms. The age of onset of MS varies, but it often develops between 42-32 years of age. MS is a lifelong (chronic) condition. There is no cure, but treatment can help slow down the  progression of the disease. What are the causes? The cause of this condition is not known. What increases the risk? You are more likely to develop this condition if:  You are a woman.  You have a relative with MS. However, the condition is not passed from parent to child (inherited).  You have a lack (deficiency) of vitamin D.  You smoke. MS is more common in the [place] than in the [place]. What are the signs or symptoms? Relapsing-remitting and secondary progressive MS cause symptoms to occur in episodes or attacks that may last weeks to months. There may be long periods between attacks in which there are almost no symptoms. Primary progressive MS causes symptoms to steadily progress after they develop. Symptoms of MS vary because of the many different ways it affects the central nervous system. The main symptoms include:  Vision problems and eye pain.  Numbness and weakness.  Inability to move your arms, hands, feet, or legs (paralysis).  Balance problems.  Shaking that you cannot control (tremors).  Muscle spasms.  Problems with thinking (cognitive changes). MS can also cause symptoms that are associated with the disease, but are not always the direct result of an MS attack. They may include:  Inability to control urination or bowel movements (incontinence).  Headaches.  Fatigue.  Inability to tolerate heat.  Emotional changes.  Depression.  Pain. How is this diagnosed? This condition is diagnosed based on:  Your symptoms.  A neurological exam. This involves checking central nervous system function,  such as nerve function, reflexes, and coordination.  MRIs of the brain and spinal cord.  Lab tests, including a lumbar puncture that tests the fluid that surrounds the brain and spinal cord (cerebrospinal fluid).  Tests to measure the electrical activity of the brain in response to stimulation (evoked potentials). How is this  treated? There is no cure for MS, but medicines can help decrease the number and frequency of attacks and help relieve nuisance symptoms. Treatment options may include:  Medicines that reduce the frequency of attacks. These medicines may be given by injection, by mouth (orally), or through an IV.  Medicines that reduce inflammation (steroids). These may provide short-term relief of symptoms.  Medicines to help control pain, depression, fatigue, or incontinence.  Nutritional counseling. Vitamin D supplements, if you have a deficiency.  Using devices to help you move around (assistive devices), such as braces, a cane, or a walker.  Physical therapy to strengthen and stretch your muscles.  Occupational therapy to help you with everyday tasks.  Alternative or complementary treatments such as exercise, massage, or acupuncture.   Follow these instructions at home:  Take over-the-counter and prescription medicines only as told by your health care provider.  Do not drive or use heavy machinery while taking prescription pain medicine.  Use assistive devices as recommended by your physical therapist or your health care provider.  Exercise as directed by your health care provider.  Eating healthy can help manage MS symptoms.  Return to your normal activities as told by your health care provider. Ask your health care provider what activities are safe for you.  Reach out for support. Share your feelings with friends, family, or a support group.  Keep all follow-up visits as told by your health care provider and therapists. This is important. Where to find more information  National Multiple Sclerosis Society: https://www.nationalmssociety.org  [employer] of Neurological Disorders and Stroke: https://johnson-smith.net/  [employer] for Complementary and Integrative Health: http://miller-hamilton.net/ Contact a health care provider if:  You feel depressed.  You develop new  pain or numbness.  You have tremors.  You have problems with sexual function. Get help right away if:  You develop paralysis.  You develop numbness.  You have problems with your bladder or bowel function.  You develop double vision.  You lose vision in one or both eyes.  You develop suicidal thoughts.  You develop severe confusion. If you ever feel like you may hurt yourself or others, or have thoughts about taking your own life, get help right away. You can go to your nearest emergency department or call:  Your local emergency services (911 in the [place]  A suicide crisis helpline, such as the National Suicide Prevention Lifeline at 210-272-2137. This is open 24 hours a day. Summary  Multiple sclerosis (MS) is a disease of the central nervous system that causes the body's immune system to destroy the protective covering (myelin sheath) around nerves in the brain.  There are 3 types of MS: relapsing-remitting, secondary progressive, and primary progressive. Relapsing-remitting and secondary progressive MS cause symptoms to occur in episodes or attacks that may last weeks to months. Primary progressive MS causes symptoms to steadily progress after they develop.  There is no cure for MS, but medicines can help decrease the number and frequency of attacks and help relieve nuisance symptoms. Treatment may also include physical or occupational therapy.  If you develop numbness, paralysis, vision problems, or other neurological symptoms, get help right away. This information is not  intended to replace advice given to you by your health care provider. Make sure you discuss any questions you have with your health care provider. Document Revised: 05/02/2020 Document Reviewed: 05/02/2020 Elsevier Patient Education  2021 [employer].

## 2020-08-30 NOTE — Telephone Encounter (Signed)
Received the following email from Crystal today: "I was able to speak to this patient today.  He has about two weeks of medication on hand, so I am following up with him next week for screening."

## 2020-08-30 NOTE — Telephone Encounter (Signed)
Thank you so much

## 2020-09-01 ENCOUNTER — Telehealth: Payer: Self-pay | Admitting: Family Medicine

## 2020-09-01 NOTE — Telephone Encounter (Signed)
Vicky w/[employer] if calling about a PA for Rx testosterone 20.25 MG they are needing to know what medication that the pt has tried and failed for this diagnoses.  They would like to have a call back.

## 2020-09-01 NOTE — Telephone Encounter (Signed)
He has never tried any other form of testosterone replacement. We can certainly try a different form of his insurance will cover that (shots, for example)

## 2020-09-01 NOTE — Telephone Encounter (Signed)
Called [employer] they are needing to know what two forms of testosterone has the patient tried and failed.  Patient wants to receive medication at a lower price

## 2020-09-04 NOTE — Telephone Encounter (Signed)
[employer]  [phone] Option 5  Denied Tiering aception request

## 2020-09-04 NOTE — Telephone Encounter (Signed)
Testosterone 20.25 MG/ACT (1.62%) GEL  [employer]  Denied Tiering exception request

## 2020-09-04 NOTE — Telephone Encounter (Signed)
Due to patient not trying and failing any prior testosterone cost of medication is $100 for 15 day supply.  In 2021, it cost the patient $86. Spoke with patient to inform of cost.  Can another Testosterone prescription can be sent to the pharmacy for possibly lower cost?

## 2020-09-05 NOTE — Telephone Encounter (Signed)
Have the patient find out what forms of this they will cover?

## 2020-09-05 NOTE — Telephone Encounter (Signed)
LVM message for patient.  Will call back to office when find out what will be covered

## 2020-09-06 NOTE — Telephone Encounter (Signed)
Spoke with patient, he called [employer] and the representative stated that a form was faxed into the office with a list of three medications that could be prescribed for the patient.

## 2020-09-06 NOTE — Telephone Encounter (Signed)
I never got this fax. Please have Satoru find out what they will cover

## 2020-09-07 NOTE — Telephone Encounter (Addendum)
Called [employer] spoke with [place].  He stated that there is no other testosterone prescription that would be a lower cost than $100.  Another prior authorization was submitted today for another tier exemption.    Awaiting response.

## 2020-09-13 ENCOUNTER — Encounter: Payer: Self-pay | Admitting: Family Medicine

## 2020-09-13 MED ORDER — VUMERITY 231 MG PO CPDR: 462.0000 mg | DELAYED_RELEASE_CAPSULE | Freq: Two times a day (BID) | ORAL | 11 refills | Status: DC

## 2020-09-13 NOTE — Telephone Encounter (Signed)
I spoke to [facility], had not received approval yet from [employer] FD program.  I called spoke to teresa with [employer] pt was enrolled and approved 09-13-2020 thru 08-04-2021 and then [place] with [facility].  She said the specialty pharmacy for pt is [facility].  I did prescription for one year for pt at 462mg  po bid #120 with 11 refills.  Notified pt done in .

## 2020-10-10 DIAGNOSIS — H524 Presbyopia: Secondary | ICD-10-CM | POA: Diagnosis not present

## 2020-10-20 ENCOUNTER — Encounter: Payer: Self-pay | Admitting: Family Medicine

## 2020-10-20 NOTE — Telephone Encounter (Signed)
Tell that the Lipitor provides benefits other than just lowering cholesterol levels (like stabilizing plaque build up in his arteries). If he can tolerate it, try taking the 10 mg of Lipitor just twice a week to get these benefits

## 2020-11-28 ENCOUNTER — Other Ambulatory Visit: Payer: Self-pay | Admitting: *Deleted

## 2020-11-28 DIAGNOSIS — G35 Multiple sclerosis: Secondary | ICD-10-CM

## 2020-11-28 DIAGNOSIS — R252 Cramp and spasm: Secondary | ICD-10-CM

## 2020-11-28 DIAGNOSIS — R261 Paralytic gait: Secondary | ICD-10-CM

## 2020-11-28 DIAGNOSIS — R531 Weakness: Secondary | ICD-10-CM

## 2020-12-04 NOTE — Telephone Encounter (Signed)
Faxed referral to [facility] ph# [phone] & fax # [phone].

## 2020-12-05 DIAGNOSIS — G35 Multiple sclerosis: Secondary | ICD-10-CM | POA: Diagnosis not present

## 2020-12-05 DIAGNOSIS — R261 Paralytic gait: Secondary | ICD-10-CM | POA: Diagnosis not present

## 2020-12-05 DIAGNOSIS — R531 Weakness: Secondary | ICD-10-CM | POA: Diagnosis not present

## 2020-12-05 DIAGNOSIS — R252 Cramp and spasm: Secondary | ICD-10-CM | POA: Diagnosis not present

## 2020-12-07 DIAGNOSIS — R252 Cramp and spasm: Secondary | ICD-10-CM | POA: Diagnosis not present

## 2020-12-07 DIAGNOSIS — R261 Paralytic gait: Secondary | ICD-10-CM | POA: Diagnosis not present

## 2020-12-07 DIAGNOSIS — R531 Weakness: Secondary | ICD-10-CM | POA: Diagnosis not present

## 2020-12-07 DIAGNOSIS — G35 Multiple sclerosis: Secondary | ICD-10-CM | POA: Diagnosis not present

## 2020-12-11 DIAGNOSIS — R261 Paralytic gait: Secondary | ICD-10-CM | POA: Diagnosis not present

## 2020-12-11 DIAGNOSIS — G35 Multiple sclerosis: Secondary | ICD-10-CM | POA: Diagnosis not present

## 2020-12-11 DIAGNOSIS — R531 Weakness: Secondary | ICD-10-CM | POA: Diagnosis not present

## 2020-12-11 DIAGNOSIS — R252 Cramp and spasm: Secondary | ICD-10-CM | POA: Diagnosis not present

## 2020-12-13 DIAGNOSIS — R261 Paralytic gait: Secondary | ICD-10-CM | POA: Diagnosis not present

## 2020-12-13 DIAGNOSIS — R252 Cramp and spasm: Secondary | ICD-10-CM | POA: Diagnosis not present

## 2020-12-13 DIAGNOSIS — G35 Multiple sclerosis: Secondary | ICD-10-CM | POA: Diagnosis not present

## 2020-12-13 DIAGNOSIS — R531 Weakness: Secondary | ICD-10-CM | POA: Diagnosis not present

## 2020-12-19 DIAGNOSIS — R531 Weakness: Secondary | ICD-10-CM | POA: Diagnosis not present

## 2020-12-19 DIAGNOSIS — R252 Cramp and spasm: Secondary | ICD-10-CM | POA: Diagnosis not present

## 2020-12-19 DIAGNOSIS — G35 Multiple sclerosis: Secondary | ICD-10-CM | POA: Diagnosis not present

## 2020-12-19 DIAGNOSIS — R261 Paralytic gait: Secondary | ICD-10-CM | POA: Diagnosis not present

## 2020-12-22 DIAGNOSIS — G35 Multiple sclerosis: Secondary | ICD-10-CM | POA: Diagnosis not present

## 2020-12-22 DIAGNOSIS — R261 Paralytic gait: Secondary | ICD-10-CM | POA: Diagnosis not present

## 2020-12-22 DIAGNOSIS — R531 Weakness: Secondary | ICD-10-CM | POA: Diagnosis not present

## 2020-12-22 DIAGNOSIS — R252 Cramp and spasm: Secondary | ICD-10-CM | POA: Diagnosis not present

## 2020-12-26 DIAGNOSIS — R261 Paralytic gait: Secondary | ICD-10-CM | POA: Diagnosis not present

## 2020-12-26 DIAGNOSIS — G35 Multiple sclerosis: Secondary | ICD-10-CM | POA: Diagnosis not present

## 2020-12-26 DIAGNOSIS — R531 Weakness: Secondary | ICD-10-CM | POA: Diagnosis not present

## 2020-12-26 DIAGNOSIS — R252 Cramp and spasm: Secondary | ICD-10-CM | POA: Diagnosis not present

## 2020-12-29 DIAGNOSIS — R531 Weakness: Secondary | ICD-10-CM | POA: Diagnosis not present

## 2020-12-29 DIAGNOSIS — G35 Multiple sclerosis: Secondary | ICD-10-CM | POA: Diagnosis not present

## 2020-12-29 DIAGNOSIS — R261 Paralytic gait: Secondary | ICD-10-CM | POA: Diagnosis not present

## 2020-12-29 DIAGNOSIS — R252 Cramp and spasm: Secondary | ICD-10-CM | POA: Diagnosis not present

## 2021-01-05 DIAGNOSIS — G35 Multiple sclerosis: Secondary | ICD-10-CM | POA: Diagnosis not present

## 2021-01-05 DIAGNOSIS — R252 Cramp and spasm: Secondary | ICD-10-CM | POA: Diagnosis not present

## 2021-01-05 DIAGNOSIS — R531 Weakness: Secondary | ICD-10-CM | POA: Diagnosis not present

## 2021-01-05 DIAGNOSIS — R261 Paralytic gait: Secondary | ICD-10-CM | POA: Diagnosis not present

## 2021-01-12 ENCOUNTER — Other Ambulatory Visit: Payer: Self-pay | Admitting: Family Medicine

## 2021-01-12 DIAGNOSIS — R252 Cramp and spasm: Secondary | ICD-10-CM | POA: Diagnosis not present

## 2021-01-12 DIAGNOSIS — G35 Multiple sclerosis: Secondary | ICD-10-CM | POA: Diagnosis not present

## 2021-01-12 DIAGNOSIS — R261 Paralytic gait: Secondary | ICD-10-CM | POA: Diagnosis not present

## 2021-01-12 DIAGNOSIS — R531 Weakness: Secondary | ICD-10-CM | POA: Diagnosis not present

## 2021-01-19 DIAGNOSIS — R531 Weakness: Secondary | ICD-10-CM | POA: Diagnosis not present

## 2021-01-19 DIAGNOSIS — G35 Multiple sclerosis: Secondary | ICD-10-CM | POA: Diagnosis not present

## 2021-01-19 DIAGNOSIS — R252 Cramp and spasm: Secondary | ICD-10-CM | POA: Diagnosis not present

## 2021-01-19 DIAGNOSIS — R261 Paralytic gait: Secondary | ICD-10-CM | POA: Diagnosis not present

## 2021-01-23 DIAGNOSIS — R261 Paralytic gait: Secondary | ICD-10-CM | POA: Diagnosis not present

## 2021-01-23 DIAGNOSIS — G35 Multiple sclerosis: Secondary | ICD-10-CM | POA: Diagnosis not present

## 2021-01-23 DIAGNOSIS — R531 Weakness: Secondary | ICD-10-CM | POA: Diagnosis not present

## 2021-01-23 DIAGNOSIS — R252 Cramp and spasm: Secondary | ICD-10-CM | POA: Diagnosis not present

## 2021-01-26 DIAGNOSIS — G35 Multiple sclerosis: Secondary | ICD-10-CM | POA: Diagnosis not present

## 2021-01-26 DIAGNOSIS — R261 Paralytic gait: Secondary | ICD-10-CM | POA: Diagnosis not present

## 2021-01-26 DIAGNOSIS — R252 Cramp and spasm: Secondary | ICD-10-CM | POA: Diagnosis not present

## 2021-01-26 DIAGNOSIS — R531 Weakness: Secondary | ICD-10-CM | POA: Diagnosis not present

## 2021-01-30 DIAGNOSIS — R252 Cramp and spasm: Secondary | ICD-10-CM | POA: Diagnosis not present

## 2021-01-30 DIAGNOSIS — G35 Multiple sclerosis: Secondary | ICD-10-CM | POA: Diagnosis not present

## 2021-01-30 DIAGNOSIS — R261 Paralytic gait: Secondary | ICD-10-CM | POA: Diagnosis not present

## 2021-01-30 DIAGNOSIS — R531 Weakness: Secondary | ICD-10-CM | POA: Diagnosis not present

## 2021-02-02 DIAGNOSIS — R261 Paralytic gait: Secondary | ICD-10-CM | POA: Diagnosis not present

## 2021-02-02 DIAGNOSIS — R252 Cramp and spasm: Secondary | ICD-10-CM | POA: Diagnosis not present

## 2021-02-02 DIAGNOSIS — R531 Weakness: Secondary | ICD-10-CM | POA: Diagnosis not present

## 2021-02-02 DIAGNOSIS — G35 Multiple sclerosis: Secondary | ICD-10-CM | POA: Diagnosis not present

## 2021-02-27 ENCOUNTER — Ambulatory Visit: Payer: Medicare Other | Admitting: Neurology

## 2021-04-10 ENCOUNTER — Encounter: Payer: Self-pay | Admitting: Neurology

## 2021-04-10 ENCOUNTER — Ambulatory Visit: Payer: Medicare Other | Admitting: Neurology

## 2021-04-10 VITALS — BP 136/92 | HR 82 | Ht 68.0 in | Wt 171.0 lb

## 2021-04-10 DIAGNOSIS — R261 Paralytic gait: Secondary | ICD-10-CM

## 2021-04-10 DIAGNOSIS — G35 Multiple sclerosis: Secondary | ICD-10-CM | POA: Diagnosis not present

## 2021-04-10 DIAGNOSIS — Z79899 Other long term (current) drug therapy: Secondary | ICD-10-CM | POA: Diagnosis not present

## 2021-04-10 DIAGNOSIS — R531 Weakness: Secondary | ICD-10-CM | POA: Diagnosis not present

## 2021-04-10 DIAGNOSIS — Z8669 Personal history of other diseases of the nervous system and sense organs: Secondary | ICD-10-CM

## 2021-04-10 DIAGNOSIS — R252 Cramp and spasm: Secondary | ICD-10-CM

## 2021-04-10 NOTE — Progress Notes (Signed)
[facility]  PATIENT: Alexander [name] DOB: Mar 28, 1976  REFERRING DOCTOR OR PCP:  Dr. Eleonore Chiquito (PCP)  _________________________________   HISTORICAL  CHIEF COMPLAINT:  Chief Complaint  Patient presents with   Follow-up    Pt alone, rm 2. Pt is taking Vumerity and states he is doing well. Stable/ mentions no concerns.     HISTORY OF PRESENT ILLNESS:  Alexander [name] is a 45 y.o. man with a relapsing/active form of secondary progressive multiple sclerosis diagnosed in 2003.     Update 04/10/2021: He is on Vumerity and tolerates it well.   ., He tolerates it very well.   No stomach upset or flushing (just once).    He has no exacerbations.     Gait and balance are off.  He uses a walker both inside and outside.   He has had a couple fall due to tripping and balance   Falls tend to be backwards.  He does worse in the heat.He has some weakness in legs right > left and to a lesser extent in the arms (right arm seems weaker but left arm is more spastic and less skilled).     He has spasticity and takes baclofen.  He skips some doses.    Bladder function is fine.   Vision is doing the same (he has ON history).  [name]     He feels fatigue is mostly stable.  He does worse in heat.  [name]   He still takes a nap daily.   He sleeps 6-8 hours at night.  Sometimes he is not refreshed in the morning.  He feels depression better than last year.  He is sometimes irritable.   Cognition is doing well.    MS history: In 2003 he had optic neuritis started on the left and then also on the right. He had an MRI of the brain performed at that time and was told that it was normal. He had follow-up MRIs every year or 2 for several more years but they continue to be normal. In 2012, he noticed worsening issues with gait. He had had some difficulty with the gait for the previous couple of years but due to orthopedic issues a neurologic cause was not looked into. An MRI of the cervical spine showed  multiple plaques consistent with MS. Initially, he was placed on Copaxone. Although he tolerated Copaxone well he had some breakthrough disease with a new spot developing in the brain. Therefore he was switched to Tysabri. He tolerated Tysabri well and his MS was well controlled. However, she was JCV antibody positive a decision was made to stop Tysabri after 2 years. Years ago he went on Gilenya. He has tolerated it well and he did better on Tysabri. He was seeing Dr. Carroll Sage at [facility] on [address]. He had Ocrevus August 2017.  He switched from Ocrevus to Vumerity January 2021 due to continued progression and fatigue while on Ocrevus.  MRI images MRI of the brain 09/21/2019 showed multiple T2/FLAIR hyperintense foci, mostly in the infratentorial white matter in a pattern and configuration consistent with chronic demyelinating plaque associated with multiple sclerosis.  None of the foci enhances or appears to be acute.  Compared to the MRI dated 02/18/2018, there are no new lesions.  MRI of the cervical spine 09/21/2019 showed patchy hyperintense signal within the spinal cord from C2-C6.  Another small focus is noted at C7-T1 to the right.  Compared to the MRI from 02/18/2018, there  are no new lesions.  Additionally, there are multilevel degenerative changes, mostly mild.  No definite nerve root compression or spinal stenosis.    REVIEW OF SYSTEMS: Constitutional: No fevers, chills, sweats, or change in appetite.   Reports minimal fatigue. Eyes: No visual changes, double vision, eye pain Ear, nose and throat: No hearing loss, ear pain, nasal congestion, sore throat Cardiovascular: No chest pain, palpitations Respiratory:  No shortness of breath at rest or with exertion.   No wheezes GastrointestinaI: No nausea, vomiting, diarrhea, abdominal pain, fecal incontinence Genitourinary:  reports frequency, hesitancy and nocturia. Musculoskeletal:  No neck pain, back  pain Integumentary: No rash, pruritus, skin lesions Neurological: as above Psychiatric: No depression at this time.  No anxiety Endocrine: No palpitations, diaphoresis, change in appetite, change in weigh or increased thirst Hematologic/Lymphatic:  No anemia, purpura, petechiae. Allergic/Immunologic: No itchy/runny eyes, nasal congestion, recent allergic reactions, rashes  ALLERGIES: No Known Allergies  HOME MEDICATIONS:  Current Outpatient Medications:    Ascorbic Acid (VITA-C PO), Take by mouth., Disp: , Rfl:    atorvastatin (LIPITOR) 20 MG tablet, TAKE 1 TABLET BY MOUTH EVERY DAY, Disp: 90 tablet, Rfl: 3   B Complex Vitamins (VITAMIN B COMPLEX PO), Take by mouth., Disp: , Rfl:    baclofen (LIORESAL) 10 MG tablet, Take up to three times daily as needed, Disp: 90 tablet, Rfl: 3   Cholecalciferol (VITAMIN D PO), Take by mouth. Take 5000-7000 units by mouth daily, Disp: , Rfl:    Diroximel Fumarate (VUMERITY) 231 MG CPDR, Take 462 mg by mouth in the morning and at bedtime., Disp: 120 capsule, Rfl: 11   escitalopram (LEXAPRO) 20 MG tablet, Take 1 tablet (20 mg total) by mouth daily., Disp: 90 tablet, Rfl: 3   MAGNESIUM PO, Take by mouth., Disp: , Rfl:    Multiple Vitamin (MULTIVITAMIN WITH MINERALS) TABS tablet, Take 1 tablet by mouth daily., Disp: , Rfl:    Multiple Vitamins-Minerals (PRESERVISION AREDS 2 PO), Take 1 Dose by mouth daily., Disp: , Rfl:    Omega-3 Fatty Acids (FISH OIL) 1000 MG CAPS, Take 2 capsules by mouth daily., Disp: , Rfl:    Probiotic Product (PROBIOTIC PO), Take 3-4 capsules by mouth daily. Nature Made, Disp: , Rfl:    Testosterone 20.25 MG/ACT (1.62%) GEL, PLACE 4 PUMP ONTO THE SKIN DAILY IN THE AFTERNOON., Disp: 75 g, Rfl: 0   UNABLE TO FIND, Take 500-1,000 mg by mouth daily. CBD oil, Disp: , Rfl:  No current facility-administered medications for this visit.  Facility-Administered Medications Ordered in Other Visits:    gadopentetate dimeglumine (MAGNEVIST)  injection 17 mL, 17 mL, Intravenous, Once PRN, Anson Fret, MD  PAST MEDICAL HISTORY: Past Medical History:  Diagnosis Date   Hypertension    Multiple sclerosis (HCC) sees Dr. Despina Arias   Vision abnormalities     PAST SURGICAL HISTORY: Past Surgical History:  Procedure Laterality Date   ACHILLES TENDON SURGERY     ANTERIOR CRUCIATE LIGAMENT REPAIR     MENISCUS REPAIR      FAMILY HISTORY: Family History  Problem Relation Age of Onset   Congestive Heart Failure Father    Stroke Maternal Grandfather    Multiple sclerosis Sister     SOCIAL HISTORY:  Social History   Socioeconomic History   Marital status: Divorced    Spouse name: Not on file   Number of children: Not on file   Years of education: Not on file   Highest education level: Not on file  Occupational History   Not on file  Tobacco Use   Smoking status: Never   Smokeless tobacco: Never  Substance and Sexual Activity   Alcohol use: No   Drug use: No   Sexual activity: Not on file  Other Topics Concern   Not on file  Social History Narrative   Not on file   Social Determinants of Health   Financial Resource Strain: Not on file  Food Insecurity: Not on file  Transportation Needs: Not on file  Physical Activity: Not on file  Stress: Not on file  Social Connections: Socially Isolated   Frequency of Communication with Friends and Family: More than three times a week   Frequency of Social Gatherings with Friends and Family: Twice a week   Attends Religious Services: Never   Database administrator or Organizations: No   Attends Banker Meetings: Never   Marital Status: Divorced  Catering manager Violence: Not on file     PHYSICAL EXAM  Vitals:   04/10/21 1007  BP: (!) 136/92  Pulse: 82  Weight: 171 lb (77.6 kg)  Height: 5\' 8"  (1.727 m)    Body mass index is 26 kg/m.   General: The patient is well-developed and well-nourished and in no acute distress.       Neurologic Exam  Mental status: The patient is alert and oriented x 3 at the time of the examination. The patient has apparent normal recent and remote memory, with an apparently normal attention span and concentration ability.   Speech is normal.  Cranial nerves: Extraocular movements exam shows nystagmus on right gaze c/w INO.  He has reduced color vision out of the right eye.. Facial strength and sensation and trapezius strength were normal.  Hearing appeared to be normal and symmetric.  Motor:  Muscle bulk is normal.  Muscle tone is increased in the legs, left greater than right and in the left arm.   Right arm has normal tone.   He has 4+/5 strength in the left leg and 5-/5 strength in the right leg.  Reduced RAM in left ar and both legs (worse on left)  Sensory: He has reduced sensation to vibration in the left leg.  Touch sensation was more symmetric.  Coordination: Finger-nose-finger is performed slowly on the left.  Heel-to-shin is performed poorly on the right and he cannot do on the left.  Gait and station: Station is normal.   The gait is wide and spastic.  He uses a walker.  He has a left foot drop.  He cannot do tandem walk.  Romberg is positive.    Reflexes: Deep tendon reflexes are symmetric and increased in legs,with spread at both knees (worse on left) and nonsustained clonus at left ankle.          ASSESSMENT AND PLAN    1. Multiple sclerosis (HCC)   2. Spastic gait   3. Weakness   4. High risk medication use   5. History of optic neuritis   6. Spasticity      1.   Continue Vumerity.  Check labs.  Around time of next visit, we will order an MRI o see if subclinical progression.   2.   Continue to be active and exercise as tolerated.      Continue Vit D  3.   He will return to see me in 6 months for a regular visit or call sooner if there are new or worsening neurologic symptoms.  Esdras Delair A. Epimenio Foot, MD, PhD, FAAN Certified in Neurology, Clinical  Neurophysiology, Sleep Medicine, Pain Medicine and Neuroimaging Director, [facility]  [facility] [address] [place], [place] [address] [phone]

## 2021-04-11 LAB — CBC WITH DIFFERENTIAL/PLATELET
Basophils Absolute: 0.1 10*3/uL (ref 0.0–0.2)
Basos: 2 %
EOS (ABSOLUTE): 0.4 10*3/uL (ref 0.0–0.4)
Eos: 8 %
Hematocrit: 47.1 % (ref 37.5–51.0)
Hemoglobin: 15.9 g/dL (ref 13.0–17.7)
Immature Grans (Abs): 0 10*3/uL (ref 0.0–0.1)
Immature Granulocytes: 0 %
Lymphocytes Absolute: 0.7 10*3/uL (ref 0.7–3.1)
Lymphs: 15 %
MCH: 28.4 pg (ref 26.6–33.0)
MCHC: 33.8 g/dL (ref 31.5–35.7)
MCV: 84 fL (ref 79–97)
Monocytes Absolute: 0.4 10*3/uL (ref 0.1–0.9)
Monocytes: 9 %
Neutrophils Absolute: 3.1 10*3/uL (ref 1.4–7.0)
Neutrophils: 66 %
Platelets: 195 10*3/uL (ref 150–450)
RBC: 5.6 x10E6/uL (ref 4.14–5.80)
RDW: 13 % (ref 11.6–15.4)
WBC: 4.6 10*3/uL (ref 3.4–10.8)

## 2021-04-11 LAB — HEPATIC FUNCTION PANEL
ALT: 19 IU/L (ref 0–44)
AST: 14 IU/L (ref 0–40)
Albumin: 4.8 g/dL (ref 4.0–5.0)
Alkaline Phosphatase: 72 IU/L (ref 44–121)
Bilirubin Total: 0.7 mg/dL (ref 0.0–1.2)
Bilirubin, Direct: 0.17 mg/dL (ref 0.00–0.40)
Total Protein: 7 g/dL (ref 6.0–8.5)

## 2021-06-06 ENCOUNTER — Ambulatory Visit (INDEPENDENT_AMBULATORY_CARE_PROVIDER_SITE_OTHER): Payer: Medicare Other

## 2021-06-06 DIAGNOSIS — Z0000 Encounter for general adult medical examination without abnormal findings: Secondary | ICD-10-CM

## 2021-06-06 NOTE — Progress Notes (Signed)
Subjective:   Alexander [name] is a 45 y.o. male who presents for an Initial Medicare Annual Wellness Visit.  I connected with Alexander [name] today by telephone and verified that I am speaking with the correct person using two identifiers. Location patient: home Location provider: work Persons participating in the virtual visit: patient, provider.   I discussed the limitations, risks, security and privacy concerns of performing an evaluation and management service by telephone and the availability of in person appointments. I also discussed with the patient that there may be a patient responsible charge related to this service. The patient expressed understanding and verbally consented to this telephonic visit.    Interactive audio and video telecommunications were attempted between this provider and patient, however failed, due to patient having technical difficulties OR patient did not have access to video capability.  We continued and completed visit with audio only.    Review of Systems     Cardiac Risk Factors include: none;dyslipidemia;male gender     Objective:    Today's Vitals   There is no height or weight on file to calculate BMI.  Advanced Directives 06/06/2021 06/05/2020  Does Patient Have a Medical Advance Directive? Yes No  Type of Estate agentAdvance Directive Healthcare Power of [place] Park[place];Living will -  Copy of Healthcare Power of Attorney in Chart? No - copy requested -  Would patient like information on creating a medical advance directive? - No - Patient declined    Current Medications (verified) Outpatient Encounter Medications as of 06/06/2021  Medication Sig   Ascorbic Acid (VITA-C PO) Take by mouth.   B Complex Vitamins (VITAMIN B COMPLEX PO) Take by mouth.   baclofen (LIORESAL) 10 MG tablet Take up to three times daily as needed   Cholecalciferol (VITAMIN D PO) Take by mouth. Take 5000-7000 units by mouth daily   Diroximel Fumarate (VUMERITY) 231 MG CPDR Take 462 mg by  mouth in the morning and at bedtime.   escitalopram (LEXAPRO) 20 MG tablet Take 1 tablet (20 mg total) by mouth daily.   MAGNESIUM PO Take by mouth.   Multiple Vitamin (MULTIVITAMIN WITH MINERALS) TABS tablet Take 1 tablet by mouth daily.   Multiple Vitamins-Minerals (PRESERVISION AREDS 2 PO) Take 1 Dose by mouth daily.   Omega-3 Fatty Acids (FISH OIL) 1000 MG CAPS Take 2 capsules by mouth daily.   Probiotic Product (PROBIOTIC PO) Take 3-4 capsules by mouth daily. Nature Made   UNABLE TO FIND Take 500-1,000 mg by mouth daily. CBD oil   atorvastatin (LIPITOR) 20 MG tablet TAKE 1 TABLET BY MOUTH EVERY DAY (Patient not taking: Reported on 06/06/2021)   Testosterone 20.25 MG/ACT (1.62%) GEL PLACE 4 PUMP ONTO THE SKIN DAILY IN THE AFTERNOON. (Patient not taking: Reported on 06/06/2021)   Facility-Administered Encounter Medications as of 06/06/2021  Medication   gadopentetate dimeglumine (MAGNEVIST) injection 17 mL    Allergies (verified) Patient has no known allergies.   History: Past Medical History:  Diagnosis Date   Hypertension    Multiple sclerosis (HCC) sees Dr. Despina Ariasichard Sater   Vision abnormalities    Past Surgical History:  Procedure Laterality Date   ACHILLES TENDON SURGERY     ANTERIOR CRUCIATE LIGAMENT REPAIR     MENISCUS REPAIR     Family History  Problem Relation Age of Onset   Congestive Heart Failure Father    Stroke Maternal Grandfather    Multiple sclerosis Sister    Social History   Socioeconomic History   Marital status: Divorced  Spouse name: Not on file   Number of children: Not on file   Years of education: Not on file   Highest education level: Not on file  Occupational History   Not on file  Tobacco Use   Smoking status: Never   Smokeless tobacco: Never  Substance and Sexual Activity   Alcohol use: No   Drug use: No   Sexual activity: Not on file  Other Topics Concern   Not on file  Social History Narrative   Not on file   Social  Determinants of Health   Financial Resource Strain: Low Risk    Difficulty of Paying Living Expenses: Not hard at all  Food Insecurity: No Food Insecurity   Worried About Running Out of Food in the Last Year: Never true   Anthon in the Last Year: Never true  Transportation Needs: No Transportation Needs   Lack of Transportation (Medical): No   Lack of Transportation (Non-Medical): No  Physical Activity: Inactive   Days of Exercise per Week: 0 days   Minutes of Exercise per Session: 0 min  Stress: No Stress Concern Present   Feeling of Stress : Not at all  Social Connections: Socially Isolated   Frequency of Communication with Friends and Family: Twice a week   Frequency of Social Gatherings with Friends and Family: Twice a week   Attends Religious Services: Never   Printmaker: No   Attends Music therapist: Never   Marital Status: Divorced    Tobacco Counseling Counseling given: Not Answered   Clinical Intake:  Pre-visit preparation completed: No  Pain : No/denies pain     Nutritional Risks: None Diabetes: No  How often do you need to have someone help you when you read instructions, pamphlets, or other written materials from your doctor or pharmacy?: 1 - Never What is the last grade level you completed in school?: BS  Diabetic?no  Interpreter Needed?: No  Information entered by :: L.Kemarion Abbey,LPN   Activities of Daily Living In your present state of health, do you have any difficulty performing the following activities: 06/06/2021  Hearing? N  Vision? N  Difficulty concentrating or making decisions? N  Walking or climbing stairs? N  Dressing or bathing? N  Doing errands, shopping? N  Preparing Food and eating ? N  Using the Toilet? N  In the past six months, have you accidently leaked urine? N  Do you have problems with loss of bowel control? N  Managing your Medications? N  Managing your Finances? N   Housekeeping or managing your Housekeeping? N  Some recent data might be hidden    Patient Care Team: Laurey Morale, MD as PCP - General (Family Medicine)  Indicate any recent Medical Services you may have received from other than [facility] providers in the past year (date may be approximate).     Assessment:   This is a routine wellness examination for Tay[place].  Hearing/Vision screen Vision Screening - Comments:: Annual eye exams wear glasses   Dietary issues and exercise activities discussed: Current Exercise Habits: The patient does not participate in regular exercise at present, Exercise limited by: neurologic condition(s)   Goals Addressed   None    Depression Screen PHQ 2/9 Scores 06/06/2021 06/06/2021 06/05/2020 04/29/2016  PHQ - 2 Score 0 0 0 0  PHQ- 9 Score - - 0 -    Fall Risk Fall Risk  06/06/2021 04/10/2021 06/05/2020  Falls in the  past year? 1 1 1   Number falls in past yr: 0 1 1  Injury with Fall? 0 1 1  Comment uses walker DX:  MS - hit head with one fall  Risk for fall due to : Impaired balance/gait - Impaired mobility;Impaired balance/gait;Impaired vision  Follow up Falls evaluation completed - Falls evaluation completed;Falls prevention discussed    FALL RISK PREVENTION PERTAINING TO THE HOME:  Any stairs in or around the home? No  If so, are there any without handrails? No  Home free of loose throw rugs in walkways, pet beds, electrical cords, etc? Yes  Adequate lighting in your home to reduce risk of falls? Yes   ASSISTIVE DEVICES UTILIZED TO PREVENT FALLS:  Life alert? No  Use of a cane, walker or w/c? Yes  Grab bars in the bathroom? Yes  Shower chair or bench in shower? Yes  Elevated toilet seat or a handicapped toilet? No    Cognitive Function:    Normal cognitive status assessed by direct observation by this Nurse Health Advisor. No abnormalities found.      Immunizations Immunization History  Administered Date(s) Administered    Influenza,inj,Quad PF,6+ Mos 04/29/2016, 04/30/2017, 05/05/2018, 05/10/2019, 05/12/2020   Influenza-Unspecified 04/11/2018    TDAP status: Due, Education has been provided regarding the importance of this vaccine. Advised may receive this vaccine at local pharmacy or Health Dept. Aware to provide a copy of the vaccination record if obtained from local pharmacy or Health Dept. Verbalized acceptance and understanding.  Flu Vaccine status: Up to date  Pneumococcal vaccine status: Due, Education has been provided regarding the importance of this vaccine. Advised may receive this vaccine at local pharmacy or Health Dept. Aware to provide a copy of the vaccination record if obtained from local pharmacy or Health Dept. Verbalized acceptance and understanding.  Covid-19 vaccine status: Declined, Education has been provided regarding the importance of this vaccine but patient still declined. Advised may receive this vaccine at local pharmacy or Health Dept.or vaccine clinic. Aware to provide a copy of the vaccination record if obtained from local pharmacy or Health Dept. Verbalized acceptance and understanding.  Qualifies for Shingles Vaccine? No   Zostavax completed No   Shingrix Completed?: No.    Education has been provided regarding the importance of this vaccine. Patient has been advised to call insurance company to determine out of pocket expense if they have not yet received this vaccine. Advised may also receive vaccine at local pharmacy or Health Dept. Verbalized acceptance and understanding.  Screening Tests Health Maintenance  Topic Date Due   Pneumococcal Vaccine 36-51 Years old (1 - PCV) Never done   TETANUS/TDAP  Never done   COLONOSCOPY (Pts 45-23yrs Insurance coverage will need to be confirmed)  Never done   INFLUENZA VACCINE  03/05/2021   Hepatitis C Screening  Completed   HIV Screening  Completed   HPV VACCINES  Aged Out    Health Maintenance  Health Maintenance Due  Topic  Date Due   Pneumococcal Vaccine 38-61 Years old (1 - PCV) Never done   TETANUS/TDAP  Never done   COLONOSCOPY (Pts 45-31yrs Insurance coverage will need to be confirmed)  Never done   INFLUENZA VACCINE  03/05/2021    Colorectal cancer screening: No longer required.   Lung Cancer Screening: (Low Dose CT Chest recommended if Age 22-80 years, 30 pack-year currently smoking OR have quit w/in 15years.) does not qualify.   Lung Cancer Screening Referral: n/a  Additional Screening:  Hepatitis C  Screening: does not qualify; C  Vision Screening: Recommended annual ophthalmology exams for early detection of glaucoma and other disorders of the eye. Is the patient up to date with their annual eye exam?  Yes  Who is the provider or what is the name of the office in which the patient attends annual eye exams? Dr.Wood  If pt is not established with a provider, would they like to be referred to a provider to establish care? No .   Dental Screening: Recommended annual dental exams for proper oral hygiene  Community Resource Referral / Chronic Care Management: CRR required this visit?  No   CCM required this visit?  No      Plan:     I have personally reviewed and noted the following in the patient's chart:   Medical and social history Use of alcohol, tobacco or illicit drugs  Current medications and supplements including opioid prescriptions. Patient is not currently taking opioid prescriptions. Functional ability and status Nutritional status Physical activity Advanced directives List of other physicians Hospitalizations, surgeries, and ER visits in previous 12 months Vitals Screenings to include cognitive, depression, and falls Referrals and appointments  In addition, I have reviewed and discussed with patient certain preventive protocols, quality metrics, and best practice recommendations. A written personalized care plan for preventive services as well as general preventive health  recommendations were provided to patient.     Randel Pigg, LPN   [id number]   Nurse Notes: none

## 2021-06-06 NOTE — Patient Instructions (Signed)
Mr. Alexander [name] , Thank you for taking time to come for your Medicare Wellness Visit. I appreciate your ongoing commitment to your health goals. Please review the following plan we discussed and let me know if I can assist you in the future.   Screening recommendations/referrals: Colonoscopy: not of age  Recommended yearly ophthalmology/optometry visit for glaucoma screening and checkup Recommended yearly dental visit for hygiene and checkup  Vaccinations: Influenza vaccine: declined  Pneumococcal vaccine: declined  Tdap vaccine: declined  Shingles vaccine: not of age     Advanced directives: will provide copies   Conditions/risks identified: none   Next appointment: none   Preventive Care 7 Years and Older, Male Preventive care refers to lifestyle choices and visits with your health care provider that can promote health and wellness. What does preventive care include? A yearly physical exam. This is also called an annual well check. Dental exams once or twice a year. Routine eye exams. Ask your health care provider how often you should have your eyes checked. Personal lifestyle choices, including: Daily care of your teeth and gums. Regular physical activity. Eating a healthy diet. Avoiding tobacco and drug use. Limiting alcohol use. Practicing safe sex. Taking low doses of aspirin every day. Taking vitamin and mineral supplements as recommended by your health care provider. What happens during an annual well check? The services and screenings done by your health care provider during your annual well check will depend on your age, overall health, lifestyle risk factors, and family history of disease. Counseling  Your health care provider may ask you questions about your: Alcohol use. Tobacco use. Drug use. Emotional well-being. Home and relationship well-being. Sexual activity. Eating habits. History of falls. Memory and ability to understand (cognition). Work and work  Astronomer. Screening  You may have the following tests or measurements: Height, weight, and BMI. Blood pressure. Lipid and cholesterol levels. These may be checked every 5 years, or more frequently if you are over 85 years old. Skin check. Lung cancer screening. You may have this screening every year starting at age 40 if you have a 30-pack-year history of smoking and currently smoke or have quit within the past 15 years. Fecal occult blood test (FOBT) of the stool. You may have this test every year starting at age 23. Flexible sigmoidoscopy or colonoscopy. You may have a sigmoidoscopy every 5 years or a colonoscopy every 10 years starting at age 26. Prostate cancer screening. Recommendations will vary depending on your family history and other risks. Hepatitis C blood test. Hepatitis B blood test. Sexually transmitted disease (STD) testing. Diabetes screening. This is done by checking your blood sugar (glucose) after you have not eaten for a while (fasting). You may have this done every 1-3 years. Abdominal aortic aneurysm (AAA) screening. You may need this if you are a current or former smoker. Osteoporosis. You may be screened starting at age 89 if you are at high risk. Talk with your health care provider about your test results, treatment options, and if necessary, the need for more tests. Vaccines  Your health care provider may recommend certain vaccines, such as: Influenza vaccine. This is recommended every year. Tetanus, diphtheria, and acellular pertussis (Tdap, Td) vaccine. You may need a Td booster every 10 years. Zoster vaccine. You may need this after age 58. Pneumococcal 13-valent conjugate (PCV13) vaccine. One dose is recommended after age 71. Pneumococcal polysaccharide (PPSV23) vaccine. One dose is recommended after age 65. Talk to your health care provider about which screenings and  vaccines you need and how often you need them. This information is not intended to replace  advice given to you by your health care provider. Make sure you discuss any questions you have with your health care provider. Document Released: 08/18/2015 Document Revised: 04/10/2016 Document Reviewed: 05/23/2015 Elsevier Interactive Patient Education  2017 [place] Prevention in the Home Falls can cause injuries. They can happen to people of all ages. There are many things you can do to make your home safe and to help prevent falls. What can I do on the outside of my home? Regularly fix the edges of walkways and driveways and fix any cracks. Remove anything that might make you trip as you walk through a door, such as a raised step or threshold. Trim any bushes or trees on the path to your home. Use bright outdoor lighting. Clear any walking paths of anything that might make someone trip, such as rocks or tools. Regularly check to see if handrails are loose or broken. Make sure that both sides of any steps have handrails. Any raised decks and porches should have guardrails on the edges. Have any leaves, snow, or ice cleared regularly. Use sand or salt on walking paths during winter. Clean up any spills in your garage right away. This includes oil or grease spills. What can I do in the bathroom? Use night lights. Install grab bars by the toilet and in the tub and shower. Do not use towel bars as grab bars. Use non-skid mats or decals in the tub or shower. If you need to sit down in the shower, use a plastic, non-slip stool. Keep the floor dry. Clean up any water that spills on the floor as soon as it happens. Remove soap buildup in the tub or shower regularly. Attach bath mats securely with double-sided non-slip rug tape. Do not have throw rugs and other things on the floor that can make you trip. What can I do in the bedroom? Use night lights. Make sure that you have a light by your bed that is easy to reach. Do not use any sheets or blankets that are too big for your bed.  They should not hang down onto the floor. Have a firm chair that has side arms. You can use this for support while you get dressed. Do not have throw rugs and other things on the floor that can make you trip. What can I do in the kitchen? Clean up any spills right away. Avoid walking on wet floors. Keep items that you use a lot in easy-to-reach places. If you need to reach something above you, use a strong step stool that has a grab bar. Keep electrical cords out of the way. Do not use floor polish or wax that makes floors slippery. If you must use wax, use non-skid floor wax. Do not have throw rugs and other things on the floor that can make you trip. What can I do with my stairs? Do not leave any items on the stairs. Make sure that there are handrails on both sides of the stairs and use them. Fix handrails that are broken or loose. Make sure that handrails are as long as the stairways. Check any carpeting to make sure that it is firmly attached to the stairs. Fix any carpet that is loose or worn. Avoid having throw rugs at the top or bottom of the stairs. If you do have throw rugs, attach them to the floor with carpet tape. Make  sure that you have a light switch at the top of the stairs and the bottom of the stairs. If you do not have them, ask someone to add them for you. What else can I do to help prevent falls? Wear shoes that: Do not have high heels. Have rubber bottoms. Are comfortable and fit you well. Are closed at the toe. Do not wear sandals. If you use a stepladder: Make sure that it is fully opened. Do not climb a closed stepladder. Make sure that both sides of the stepladder are locked into place. Ask someone to hold it for you, if possible. Clearly mark and make sure that you can see: Any grab bars or handrails. First and last steps. Where the edge of each step is. Use tools that help you move around (mobility aids) if they are needed. These  include: Canes. Walkers. Scooters. Crutches. Turn on the lights when you go into a dark area. Replace any light bulbs as soon as they burn out. Set up your furniture so you have a clear path. Avoid moving your furniture around. If any of your floors are uneven, fix them. If there are any pets around you, be aware of where they are. Review your medicines with your doctor. Some medicines can make you feel dizzy. This can increase your chance of falling. Ask your doctor what other things that you can do to help prevent falls. This information is not intended to replace advice given to you by your health care provider. Make sure you discuss any questions you have with your health care provider. Document Released: 05/18/2009 Document Revised: 12/28/2015 Document Reviewed: 08/26/2014 Elsevier Interactive Patient Education  2017 [employer].

## 2021-07-24 ENCOUNTER — Encounter: Payer: Self-pay | Admitting: Neurology

## 2021-08-09 ENCOUNTER — Inpatient Hospital Stay (HOSPITAL_COMMUNITY)
Admission: EM | Admit: 2021-08-09 | Discharge: 2021-08-12 | DRG: 177 | Disposition: A | Discharge status: Home Health | Payer: Medicare Other | Attending: Family Medicine | Admitting: Family Medicine

## 2021-08-09 ENCOUNTER — Emergency Department (HOSPITAL_COMMUNITY): Payer: Medicare Other

## 2021-08-09 ENCOUNTER — Inpatient Hospital Stay (HOSPITAL_COMMUNITY): Payer: Medicare Other

## 2021-08-09 ENCOUNTER — Other Ambulatory Visit: Payer: Self-pay

## 2021-08-09 DIAGNOSIS — J690 Pneumonitis due to inhalation of food and vomit: Secondary | ICD-10-CM | POA: Diagnosis not present

## 2021-08-09 DIAGNOSIS — R0602 Shortness of breath: Secondary | ICD-10-CM | POA: Diagnosis not present

## 2021-08-09 DIAGNOSIS — G35 Multiple sclerosis: Secondary | ICD-10-CM | POA: Diagnosis not present

## 2021-08-09 DIAGNOSIS — F419 Anxiety disorder, unspecified: Secondary | ICD-10-CM | POA: Diagnosis not present

## 2021-08-09 DIAGNOSIS — R0902 Hypoxemia: Secondary | ICD-10-CM | POA: Diagnosis not present

## 2021-08-09 DIAGNOSIS — D696 Thrombocytopenia, unspecified: Secondary | ICD-10-CM | POA: Diagnosis not present

## 2021-08-09 DIAGNOSIS — J1282 Pneumonia due to coronavirus disease 2019: Secondary | ICD-10-CM | POA: Diagnosis present

## 2021-08-09 DIAGNOSIS — M2578 Osteophyte, vertebrae: Secondary | ICD-10-CM | POA: Diagnosis not present

## 2021-08-09 DIAGNOSIS — I10 Essential (primary) hypertension: Secondary | ICD-10-CM | POA: Diagnosis present

## 2021-08-09 DIAGNOSIS — J159 Unspecified bacterial pneumonia: Secondary | ICD-10-CM | POA: Diagnosis present

## 2021-08-09 DIAGNOSIS — U071 COVID-19: Principal | ICD-10-CM | POA: Diagnosis present

## 2021-08-09 DIAGNOSIS — Z820 Family history of epilepsy and other diseases of the nervous system: Secondary | ICD-10-CM

## 2021-08-09 DIAGNOSIS — F32A Depression, unspecified: Secondary | ICD-10-CM | POA: Diagnosis not present

## 2021-08-09 DIAGNOSIS — G319 Degenerative disease of nervous system, unspecified: Secondary | ICD-10-CM | POA: Diagnosis not present

## 2021-08-09 DIAGNOSIS — E785 Hyperlipidemia, unspecified: Secondary | ICD-10-CM | POA: Diagnosis not present

## 2021-08-09 DIAGNOSIS — R000 Tachycardia, unspecified: Secondary | ICD-10-CM | POA: Diagnosis not present

## 2021-08-09 DIAGNOSIS — Z823 Family history of stroke: Secondary | ICD-10-CM

## 2021-08-09 DIAGNOSIS — Z8249 Family history of ischemic heart disease and other diseases of the circulatory system: Secondary | ICD-10-CM

## 2021-08-09 DIAGNOSIS — R509 Fever, unspecified: Secondary | ICD-10-CM | POA: Diagnosis not present

## 2021-08-09 DIAGNOSIS — M40204 Unspecified kyphosis, thoracic region: Secondary | ICD-10-CM | POA: Diagnosis not present

## 2021-08-09 DIAGNOSIS — M47812 Spondylosis without myelopathy or radiculopathy, cervical region: Secondary | ICD-10-CM | POA: Diagnosis not present

## 2021-08-09 DIAGNOSIS — J189 Pneumonia, unspecified organism: Secondary | ICD-10-CM | POA: Diagnosis not present

## 2021-08-09 LAB — CBC WITH DIFFERENTIAL/PLATELET
Abs Immature Granulocytes: 0.04 10*3/uL (ref 0.00–0.07)
Basophils Absolute: 0 10*3/uL (ref 0.0–0.1)
Basophils Relative: 0 %
Eosinophils Absolute: 0 10*3/uL (ref 0.0–0.5)
Eosinophils Relative: 0 %
HCT: 50.1 % (ref 39.0–52.0)
Hemoglobin: 16.2 g/dL (ref 13.0–17.0)
Immature Granulocytes: 0 %
Lymphocytes Relative: 7 %
Lymphs Abs: 0.6 10*3/uL — ABNORMAL LOW (ref 0.7–4.0)
MCH: 28.5 pg (ref 26.0–34.0)
MCHC: 32.3 g/dL (ref 30.0–36.0)
MCV: 88 fL (ref 80.0–100.0)
Monocytes Absolute: 0.7 10*3/uL (ref 0.1–1.0)
Monocytes Relative: 8 %
Neutro Abs: 7.6 10*3/uL (ref 1.7–7.7)
Neutrophils Relative %: 85 %
Platelets: 142 10*3/uL — ABNORMAL LOW (ref 150–400)
RBC: 5.69 MIL/uL (ref 4.22–5.81)
RDW: 13.2 % (ref 11.5–15.5)
WBC: 9 10*3/uL (ref 4.0–10.5)
nRBC: 0 % (ref 0.0–0.2)

## 2021-08-09 LAB — COMPREHENSIVE METABOLIC PANEL
ALT: 28 U/L (ref 0–44)
AST: 23 U/L (ref 15–41)
Albumin: 4.3 g/dL (ref 3.5–5.0)
Alkaline Phosphatase: 44 U/L (ref 38–126)
Anion gap: 12 (ref 5–15)
BUN: 9 mg/dL (ref 6–20)
CO2: 21 mmol/L — ABNORMAL LOW (ref 22–32)
Calcium: 8.7 mg/dL — ABNORMAL LOW (ref 8.9–10.3)
Chloride: 103 mmol/L (ref 98–111)
Creatinine, Ser: 0.81 mg/dL (ref 0.61–1.24)
GFR, Estimated: >60 mL/min (ref >60)
Glucose, Bld: 126 mg/dL — ABNORMAL HIGH (ref 70–99)
Potassium: 4.1 mmol/L (ref 3.5–5.1)
Sodium: 136 mmol/L (ref 135–145)
Total Bilirubin: 1.2 mg/dL (ref 0.3–1.2)
Total Protein: 7.4 g/dL (ref 6.5–8.1)

## 2021-08-09 LAB — URINALYSIS, ROUTINE W REFLEX MICROSCOPIC
Bilirubin Urine: NEGATIVE
Glucose, UA: NEGATIVE mg/dL
Hgb urine dipstick: NEGATIVE
Ketones, ur: 80 mg/dL — AB
Leukocytes,Ua: NEGATIVE
Nitrite: NEGATIVE
Protein, ur: NEGATIVE mg/dL
Specific Gravity, Urine: 1.014 (ref 1.005–1.030)
pH: 6 (ref 5.0–8.0)

## 2021-08-09 LAB — RESP PANEL BY RT-PCR (FLU A&B, COVID) ARPGX2
Influenza A by PCR: NEGATIVE
Influenza B by PCR: NEGATIVE
SARS Coronavirus 2 by RT PCR: POSITIVE — AB

## 2021-08-09 LAB — LACTIC ACID, PLASMA: Lactic Acid, Venous: 2 mmol/L (ref 0.5–1.9)

## 2021-08-09 IMAGING — MR MR THORACIC SPINE WO/W CM
9 of 10 series · 36 of 48 positions shown · IV contrast (gadavist)
Comparison: None available.

CLINICAL DATA: Initial evaluation for history of multiple
sclerosis, acute flare.

EXAM:
MRI THORACIC WITHOUT AND WITH CONTRAST
TECHNIQUE: Multiplanar and multiecho pulse sequences of the thoracic spine were
obtained without and with intravenous contrast.
CONTRAST:  8.5mL GADAVIST GADOBUTROL 1 MMOL/ML IV SOLN

[Series 18: T1 · sagittal · 4.0mm · 1.72mm/px · 2 of 5 slices shown (1 of 3)]
[im 1/5]
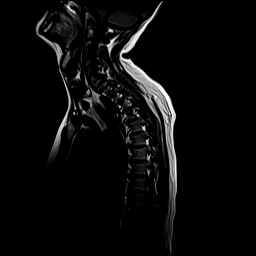
[im 5/5]
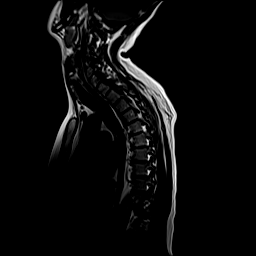

[Series 19: STIR · sagittal · 3.0mm · 1.33mm/px · 5 of 15 slices shown]
[im 1/15]
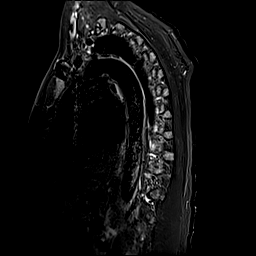
[im 4/15]
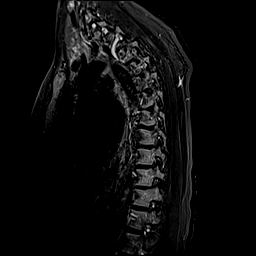
[im 8/15]
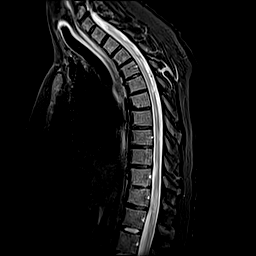
[im 11/15]
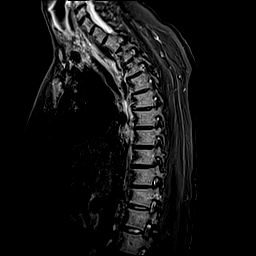
[im 15/15]
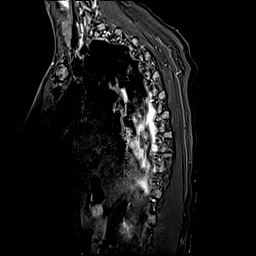

[Series 20: T1 · sagittal · 3.0mm · 1.00mm/px · 5 of 15 slices shown (2 of 3)]
[im 1/15]
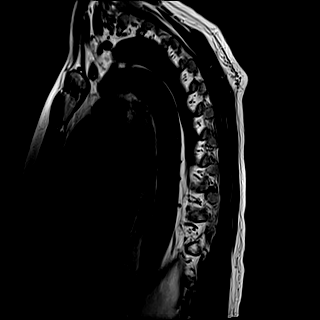
[im 4/15]
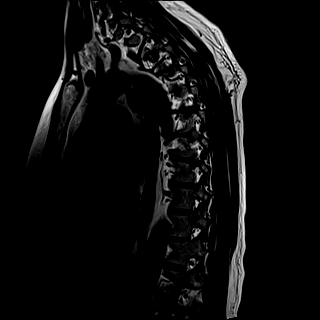
[im 8/15]
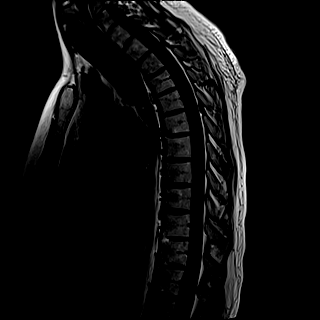
[im 11/15]
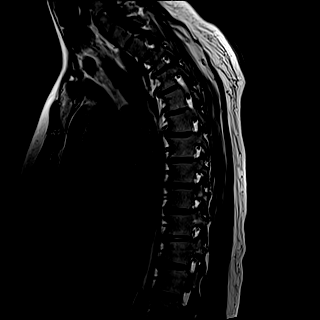
[im 15/15]
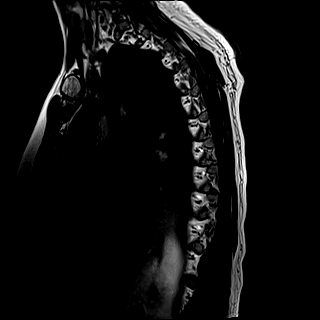

[Series 21: T2 · sagittal · 3.0mm · 0.83mm/px · 5 of 15 slices shown (1 of 2)]
[im 1/15]
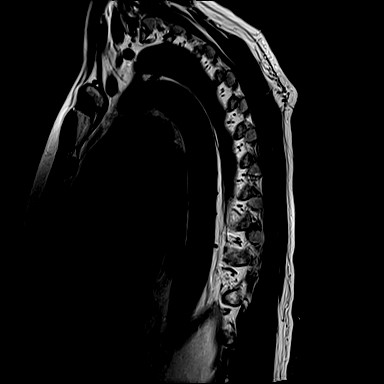
[im 4/15]
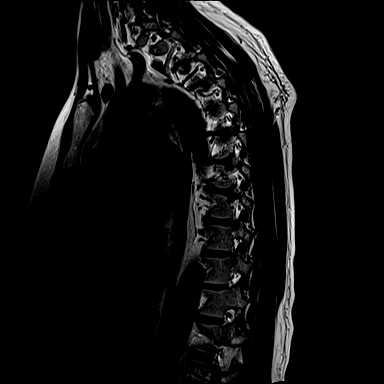
[im 8/15]
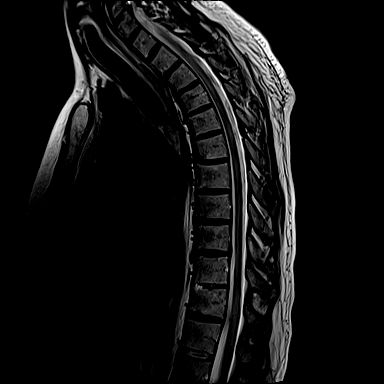
[im 11/15]
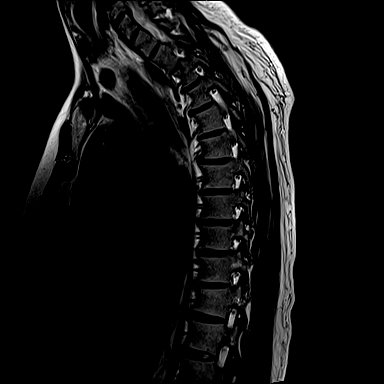
[im 15/15]
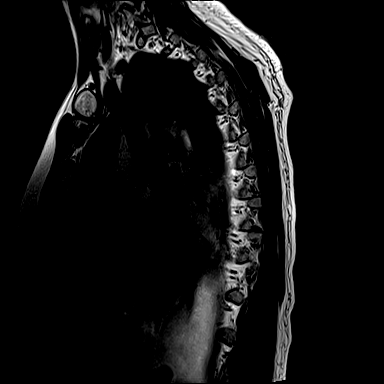

[Series 22: T2 · axial · 4.0mm · 0.78mm/px · z∈[-433,-246]mm · 4 of 15 slices shown (2 of 2)]
[im 1/15]
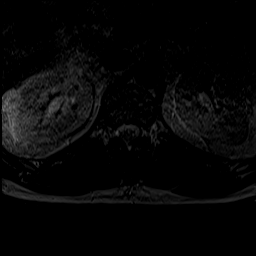
[im 5/15]
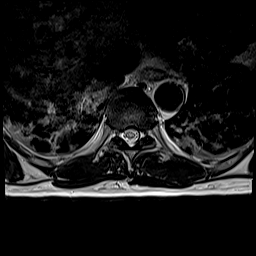
[im 10/15]
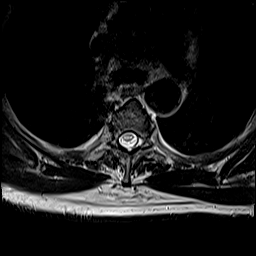
[im 15/15]
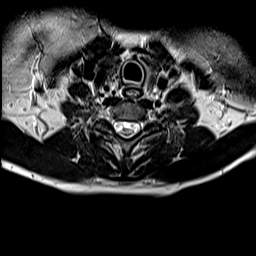

[Series 23: T1 · axial · 4.0mm · 0.39mm/px · z∈[-433,-246]mm · 4 of 15 slices shown (3 of 3)]
[im 1/15]
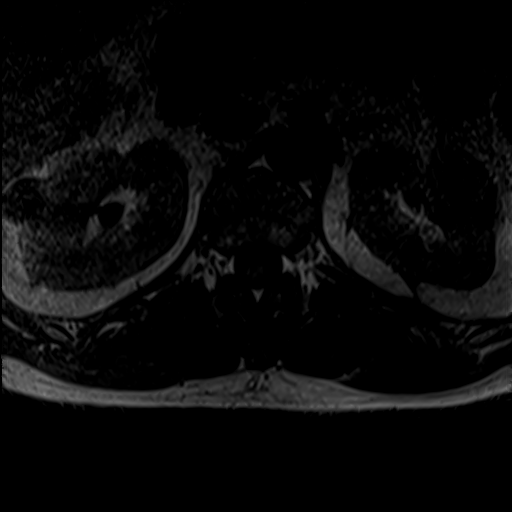
[im 5/15]
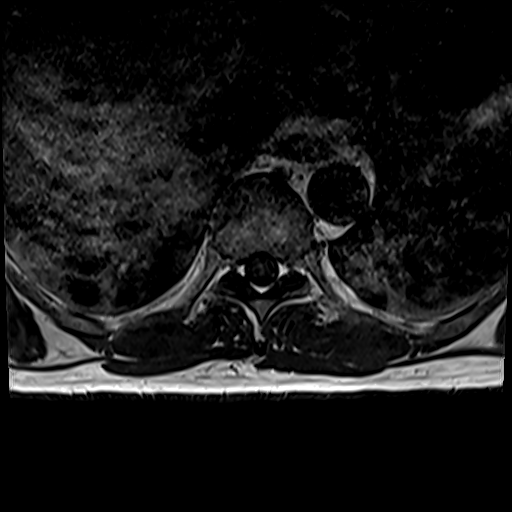
[im 10/15]
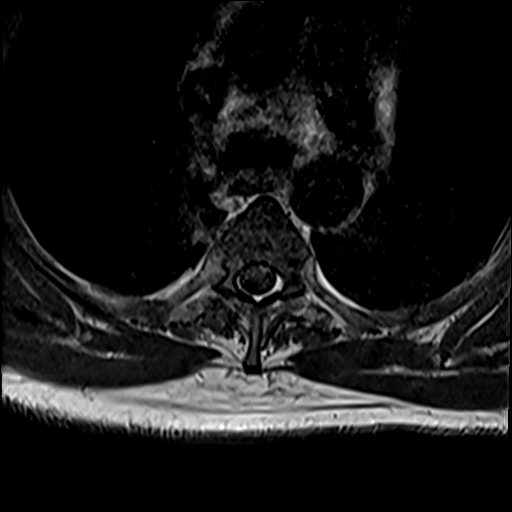
[im 15/15]
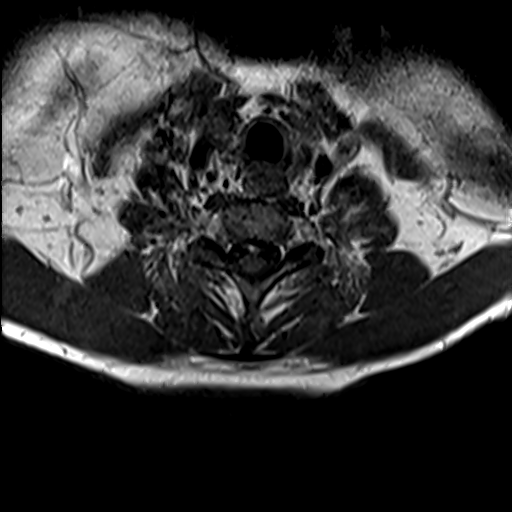

[Series 25: T2 post-contrast · sagittal · 3.0mm · 0.83mm/px · 4 of 15 slices shown]
[im 1/15]
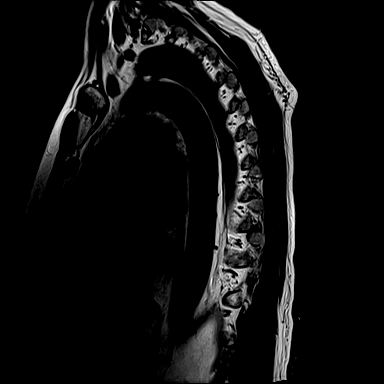
[im 5/15]
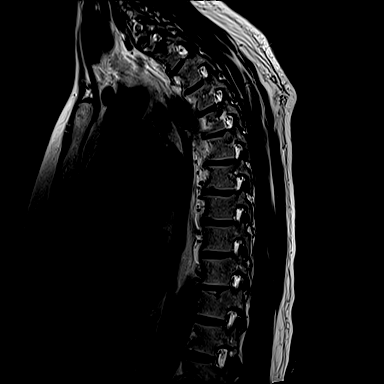
[im 10/15]
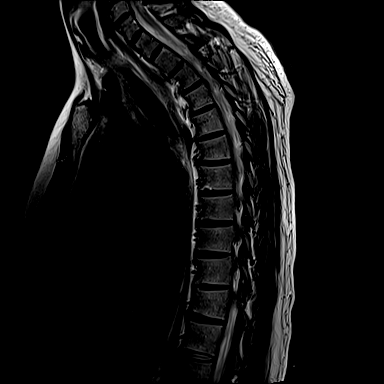
[im 15/15]
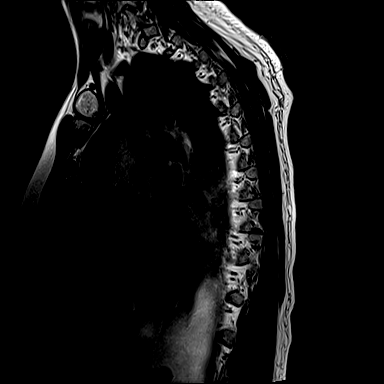

[Series 26: T1 fat-sat post-contrast · sagittal · 3.0mm · 1.25mm/px · 4 of 15 slices shown]
[im 1/15]
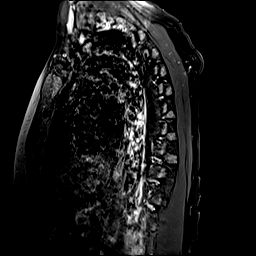
[im 5/15]
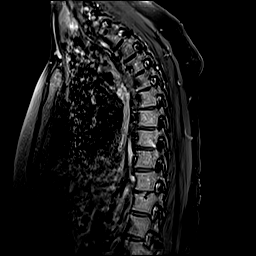
[im 10/15]
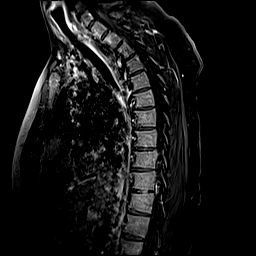
[im 15/15]
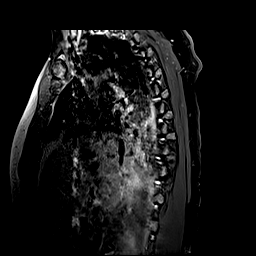

[Series 27: T1 post-contrast · axial · 4.0mm · 0.39mm/px · z∈[-438,-354]mm · 3 of 39 slices shown]
[im 1/39]
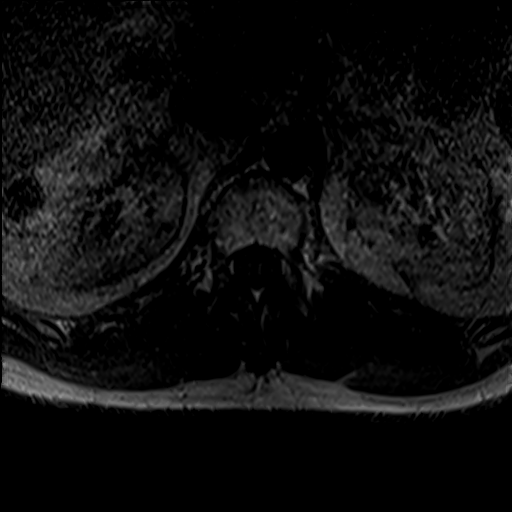
[im 8/39]
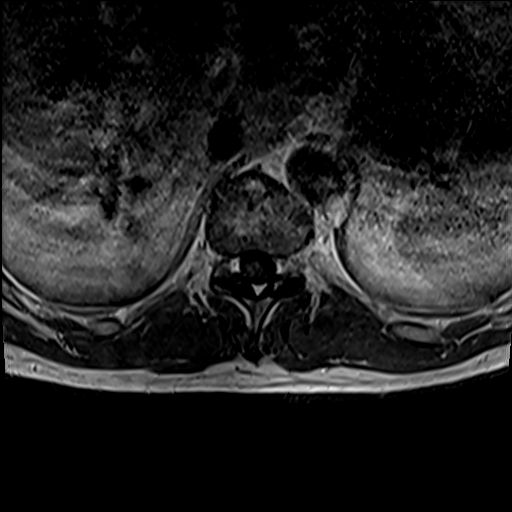
[im 12/39]
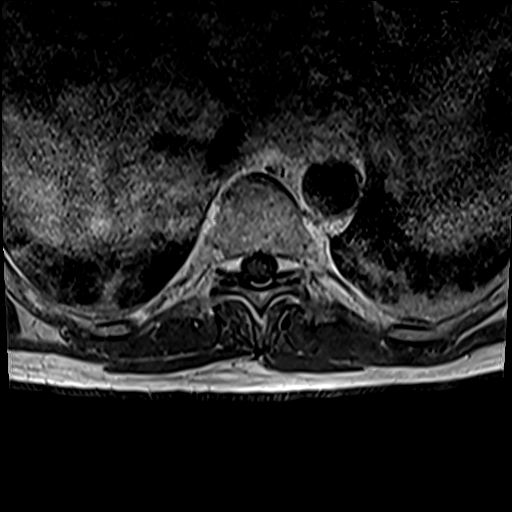

[36 of 48 positions shown; findings below may reference images not displayed]

FINDINGS: Alignment: Physiologic with preservation of the normal thoracic
kyphosis. No listhesis.

Vertebrae: Vertebral body height maintained without acute or chronic
fracture. Bone marrow signal intensity within normal limits. No
worrisome osseous lesions. No abnormal marrow edema or enhancement.

Cord: Signal intensity within the thoracic spinal cord is grossly
within normal limits, although evaluation mildly limited by motion.
No convincing cord signal changes to suggest demyelinating disease.
Normal cord caliber and morphology. No abnormal enhancement.

Paraspinal and other soft tissues: Paraspinous soft tissues
demonstrate no acute finding. Few small exophytic benign-appearing
cyst partially visualized about the kidneys. Atelectatic changes
seen dependently within the visualized lung bases.

Disc levels:

T11-12: Left paracentral disc protrusion with slight superior
migration indents the ventral thecal sac (series 21, image 9). No
significant spinal stenosis or cord impingement. Foramina remain
patent.

Otherwise, no other significant disc pathology seen elsewhere within
the thoracic spine. No other stenosis or neural impingement.
Foramina remain patent.
IMPRESSION: 1. Normal MRI appearance of the thoracic spinal cord. No evidence
for demyelinating disease.
2. Left paracentral disc protrusion at T11-12 without significant
stenosis or neural impingement.

## 2021-08-09 IMAGING — MR MR HEAD WO/W CM
12 of 14 series · 42 of 48 positions shown · IV contrast (gadavist)
Comparison: MRI from [DATE].

CLINICAL DATA: Initial evaluation for multiple sclerosis.

EXAM:
MRI HEAD WITHOUT AND WITH CONTRAST
TECHNIQUE: Multiplanar, multiecho pulse sequences of the brain and surrounding
structures were obtained without and with intravenous contrast.
CONTRAST:  8.5mL GADAVIST GADOBUTROL 1 MMOL/ML IV SOLN

[Series 12: DWI · axial · 3.0mm · 1.36mm/px · z∈[-66,+66]mm · 4 of 48 slices shown]
[im 1/48]
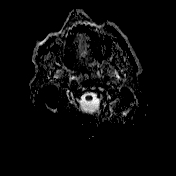
[im 16/48]
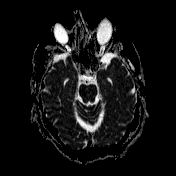
[im 32/48]
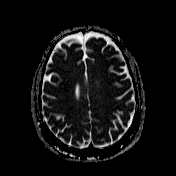
[im 48/48]
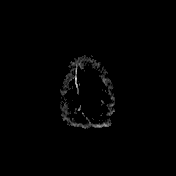

[Series 13: T1 · sagittal · 5.0mm · 0.75mm/px · 1 of 26 slices shown (1 of 2)]
[im 1/26]
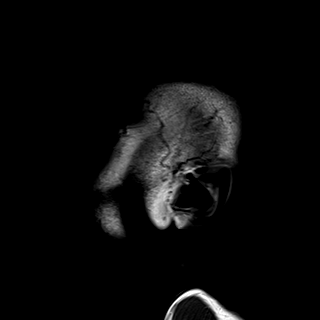

[Series 14: T2 · axial · 5.0mm · 0.62mm/px · 1 of 24 slices shown]
[im 1/24]
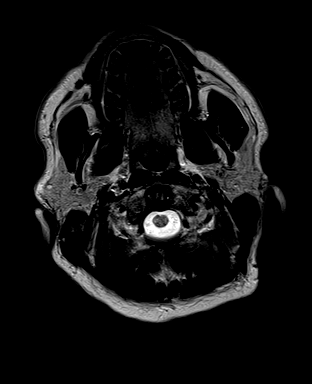

[Series 15: swi_images · axial · 3.0mm · 0.75mm/px · z∈[-93,+40]mm · 3 of 72 slices shown]
[im 1/72]
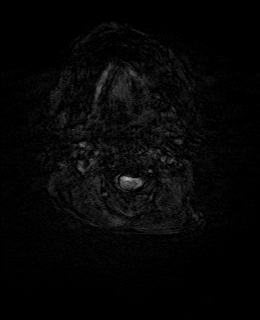
[im 24/72]
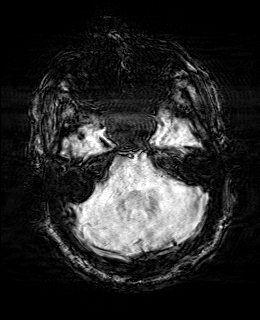
[im 48/72]
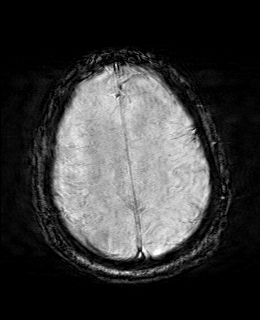

[Series 17: FLAIR · axial · 3.0mm · 0.75mm/px · z∈[-65,+79]mm · 3 of 52 slices shown (1 of 2)]
[im 1/52]
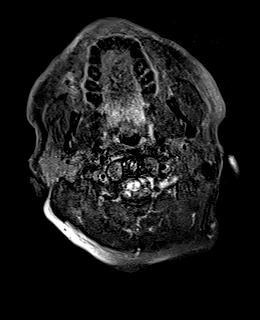
[im 26/52]
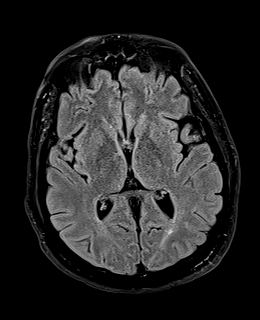
[im 52/52]
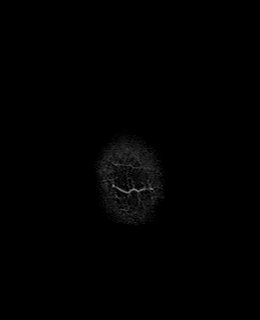

[Series 18: T1 · axial · 1.0mm · 0.94mm/px · z∈[-58,+75]mm · 8 of 144 slices shown (2 of 2)]
[im 1/144]
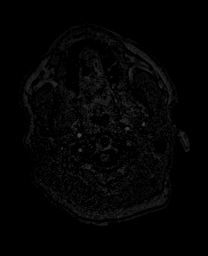
[im 21/144]
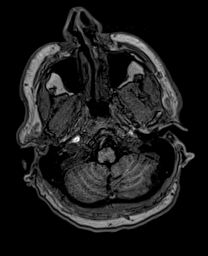
[im 41/144]
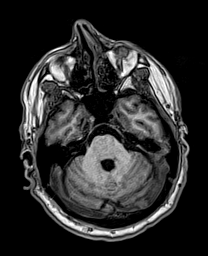
[im 62/144]
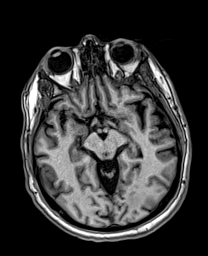
[im 82/144]
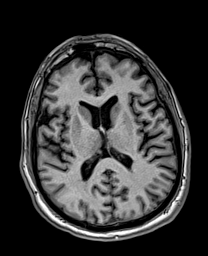
[im 103/144]
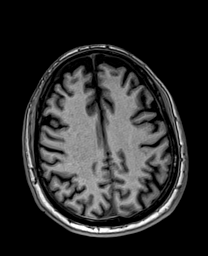
[im 123/144]
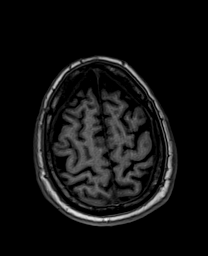
[im 144/144]
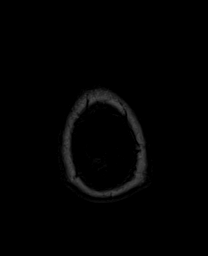

[Series 21: FLAIR · sagittal · 1.1mm · 0.50mm/px · 8 of 144 slices shown (2 of 2)]
[im 1/144]
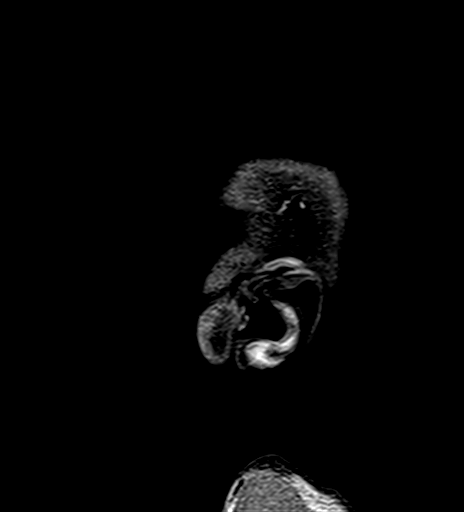
[im 21/144]
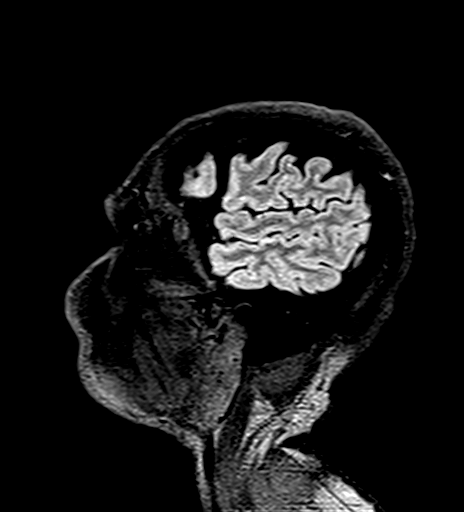
[im 41/144]
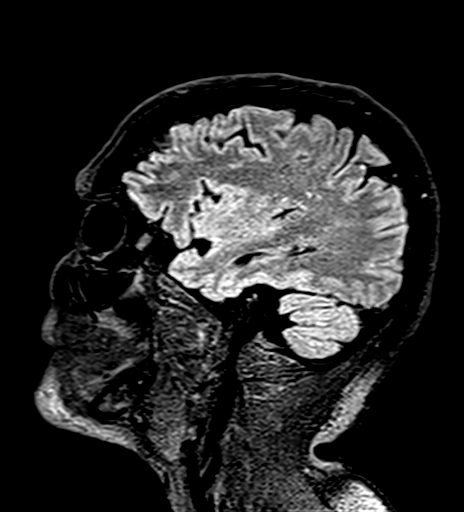
[im 62/144]
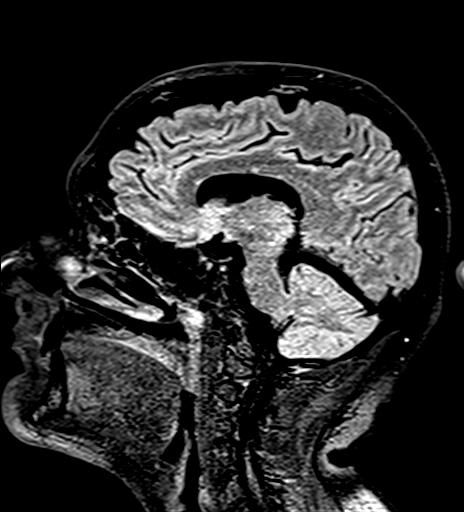
[im 82/144]
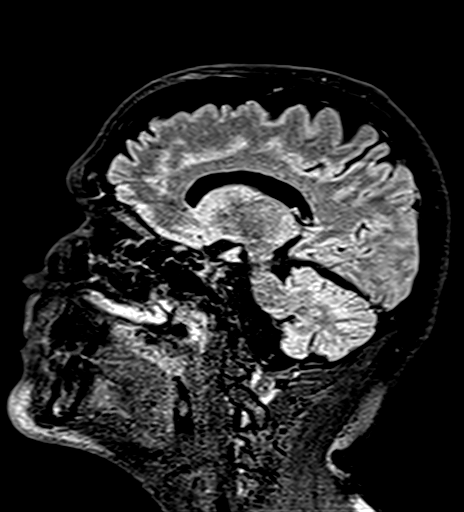
[im 103/144]
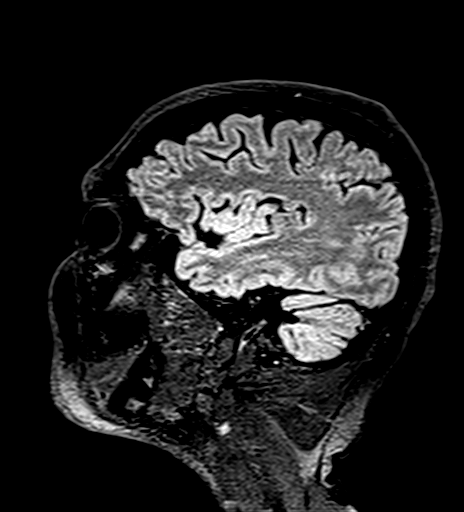
[im 123/144]
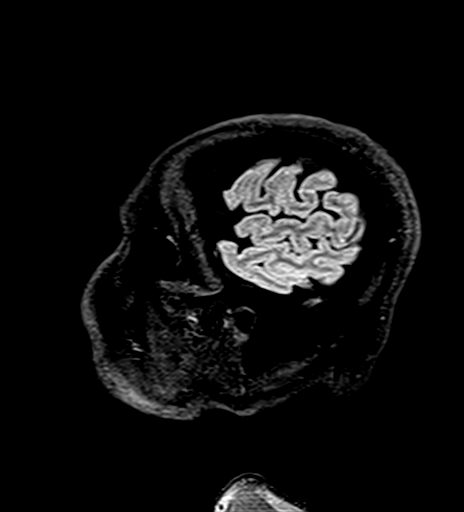
[im 144/144]
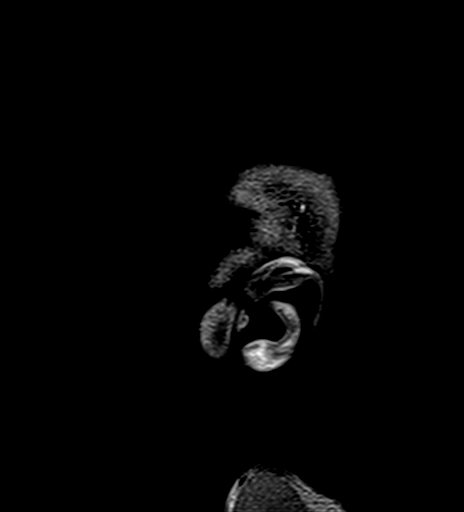

[Series 22: T2 post-contrast · coronal · 5.0mm · 0.86mm/px · 2 of 28 slices shown]
[im 1/28]
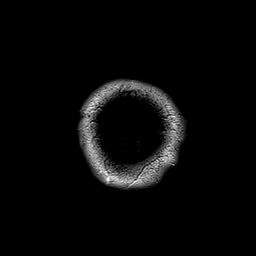
[im 28/28]
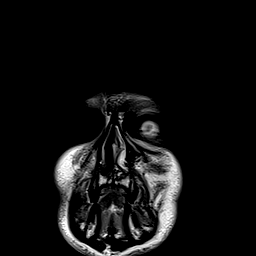

[Series 23: T1 post-contrast · axial · 1.0mm · 0.94mm/px · z∈[-58,+75]mm · 8 of 144 slices shown (1 of 4)]
[im 1/144]
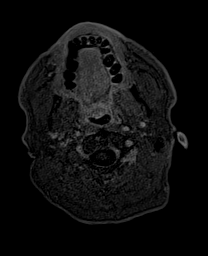
[im 21/144]
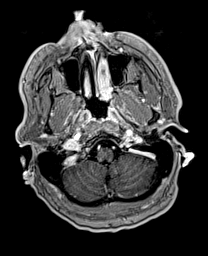
[im 41/144]
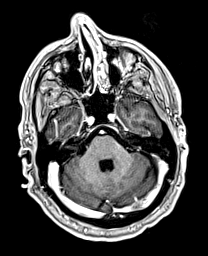
[im 62/144]
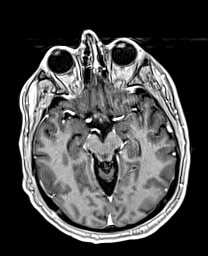
[im 82/144]
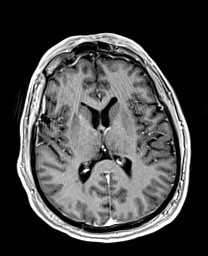
[im 103/144]
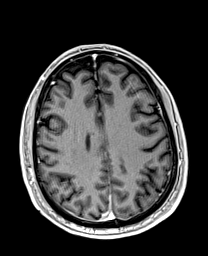
[im 123/144]
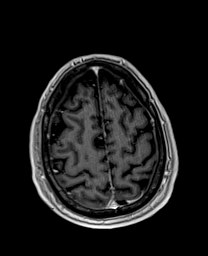
[im 144/144]
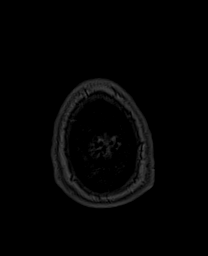

[Series 24: T1 post-contrast · coronal · 5.0mm · 0.43mm/px · 2 of 28 slices shown (2 of 4)]
[im 1/28]
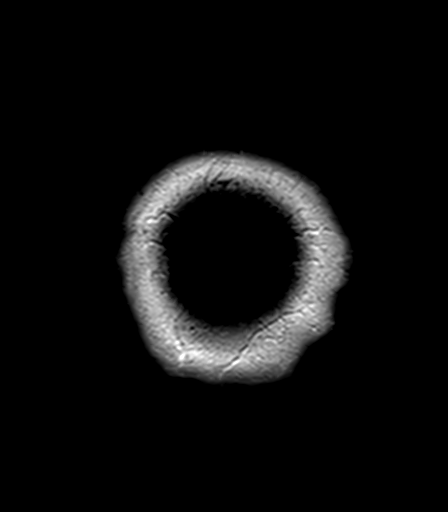
[im 28/28]
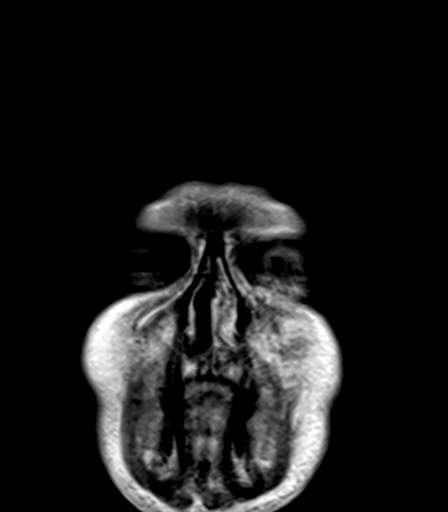

[Series 25: T1 post-contrast · sagittal · 5.0mm · 0.75mm/px · 1 of 26 slices shown (3 of 4)]
[im 1/26]
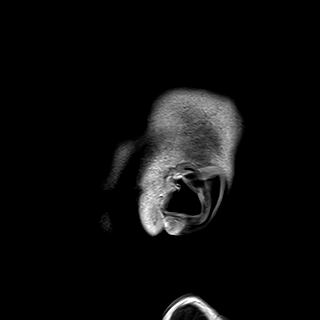

[Series 26: T1 post-contrast · sagittal · 5.0mm · 0.75mm/px · 1 of 26 slices shown (4 of 4)]
[im 1/26]
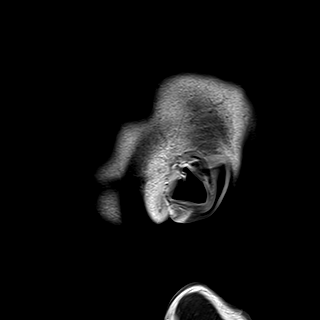

[42 of 48 positions shown; findings below may reference images not displayed]

FINDINGS: Brain: Examination somewhat technically limited by motion artifact.
Additionally, an axial DWI sequence is not available for review at
time of this dictation.

Mildly advanced cerebral atrophy for age, stable. Again seen are
multiple scattered T2/FLAIR hyperintense foci involving the
brainstem, with involvement of the medulla, pons, and midbrain.
Patchy involvement of the cerebellum with involvement of the left
middle cerebellar peduncle. Few scattered foci seen involving the
periventricular and deep white matter of both cerebral hemispheres.
Findings are presumably related to history of chronic demyelinating
disease. No abnormal enhancement to suggest active demyelination. In
comparison with previous exam, there appears to be at least 1 new
focus involving the central midbrain w (series 17, image 19), not
definitely seen on prior study, suggesting minimal disease
progression. No associated enhancement or restricted diffusion to
suggest active demyelination.

No evidence for acute or subacute infarct. Gray-white matter
differentiation otherwise maintained. No areas of chronic cortical
infarction. No evidence for acute or chronic intracranial
hemorrhage.

No mass lesion, mass effect or midline shift. No hydrocephalus or
extra-axial fluid collection. Pituitary gland suprasellar region
normal. Midline structures intact and normal.

Vascular: Major intracranial vascular flow voids are maintained.

Skull and upper cervical spine: Craniocervical junction within
normal limits. Partially visualized upper cervical spinal cord
unremarkable. Bone marrow signal intensity within normal limits. No
scalp soft tissue abnormality.

Sinuses/Orbits: Globes and orbital soft tissues within normal
limits. Mild scattered mucosal thickening noted within the ethmoidal
air cells. Paranasal sinuses are otherwise clear. No significant
mastoid effusion. Inner ear structures grossly normal.

Other: None.
IMPRESSION: 1. Few scattered T2/FLAIR hyperintense foci involving the
supratentorial and infratentorial cerebral white matter, consistent
with history of chronic demyelinating disease. In comparison with
previous exam, there appears to be at least 1 new focus involving
the central midbrain, not definitely seen on prior study, suggesting
minimal disease progression. No associated enhancement or restricted
diffusion to suggest active demyelination.
2. Underlying mildly advanced cerebral atrophy for age, stable.
3. No other acute intracranial abnormality.

## 2021-08-09 IMAGING — DX DG CHEST 1V PORT
1 series · 1 of 1 positions shown · non-contrast
Comparison: None.

CLINICAL DATA: Shortness of breath. congestion.

EXAM:
PORTABLE CHEST 1 VIEW

[chest ap]
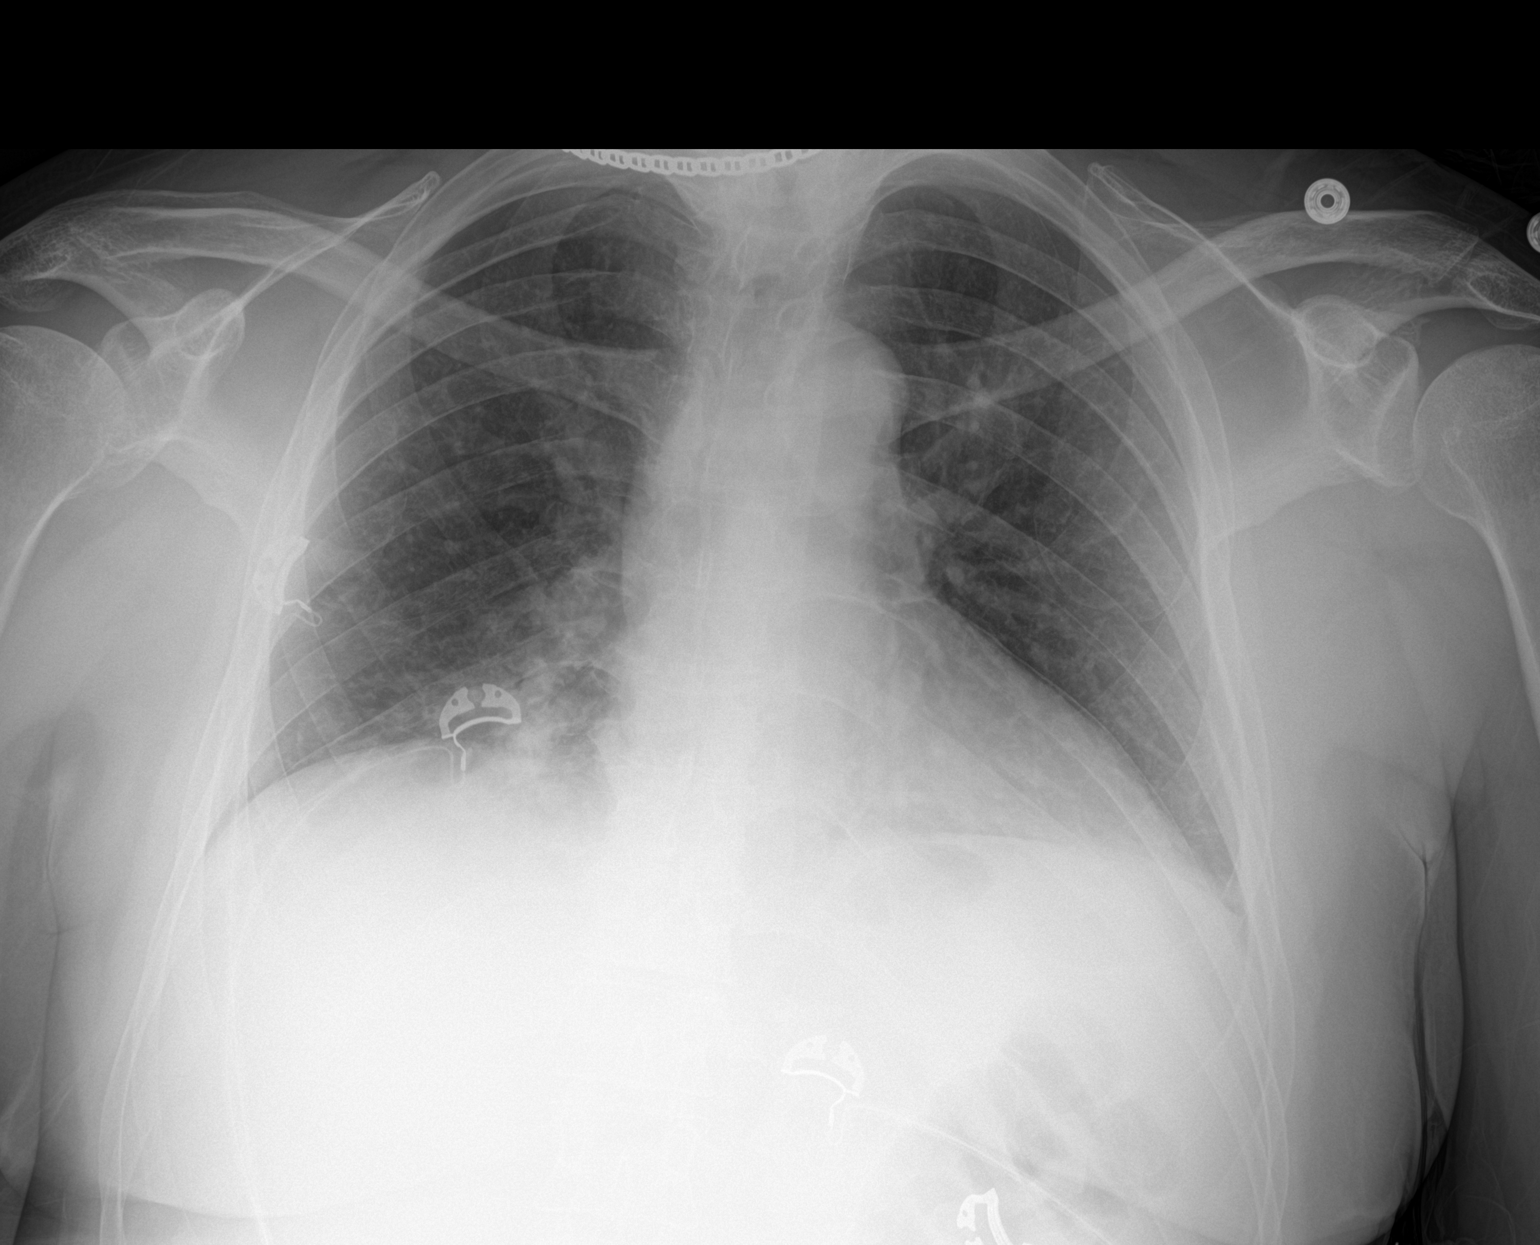

[1 of 1 positions shown; findings below may reference images not displayed]

FINDINGS: [LM] hours. Low lung volumes. Patchy airspace disease noted medial
right lung base, suspicious for pneumonia. Probable atelectasis left
base. No edema or substantial pleural effusion. The
cardiopericardial silhouette is within normal limits for size. The
visualized bony structures of the thorax show no acute abnormality.
IMPRESSION: Low volume film with patchy airspace opacity medial right lung base,
suspicious for pneumonia. Follow-up imaging recommended to ensure
resolution.

## 2021-08-09 MED ORDER — DEXAMETHASONE SODIUM PHOSPHATE 10 MG/ML IJ SOLN
10.0000 mg | Freq: Once | INTRAMUSCULAR | Status: AC
  Administered 2021-08-09: 10 mg via INTRAVENOUS
  Filled 2021-08-09: qty 1

## 2021-08-09 MED ORDER — LACTATED RINGERS IV BOLUS (SEPSIS)
500.0000 mL | Freq: Once | INTRAVENOUS | Status: AC
  Administered 2021-08-09: 500 mL via INTRAVENOUS

## 2021-08-09 MED ORDER — LACTATED RINGERS IV BOLUS (SEPSIS)
1000.0000 mL | Freq: Once | INTRAVENOUS | Status: AC
  Administered 2021-08-09: 1000 mL via INTRAVENOUS

## 2021-08-09 MED ORDER — LACTATED RINGERS IV SOLN: INTRAVENOUS | Status: DC

## 2021-08-09 MED ORDER — LACTATED RINGERS IV BOLUS
1000.0000 mL | Freq: Once | INTRAVENOUS | Status: AC
  Administered 2021-08-09: 1000 mL via INTRAVENOUS

## 2021-08-09 MED ORDER — NIRMATRELVIR/RITONAVIR (PAXLOVID)TABLET
3.0000 | ORAL_TABLET | Freq: Two times a day (BID) | ORAL | Status: DC
  Administered 2021-08-09 – 2021-08-10 (×3): 3 via ORAL
  Filled 2021-08-09: qty 30

## 2021-08-09 MED ORDER — SODIUM CHLORIDE 0.9 % IV SOLN
500.0000 mg | INTRAVENOUS | Status: DC
  Administered 2021-08-09 – 2021-08-10 (×2): 500 mg via INTRAVENOUS
  Filled 2021-08-09 (×3): qty 5

## 2021-08-09 MED ORDER — SODIUM CHLORIDE 0.9 % IV SOLN
1.0000 g | INTRAVENOUS | Status: DC
  Administered 2021-08-09 – 2021-08-12 (×4): 1 g via INTRAVENOUS
  Filled 2021-08-09 (×4): qty 10

## 2021-08-09 MED ORDER — GADOBUTROL 1 MMOL/ML IV SOLN
8.5000 mL | Freq: Once | INTRAVENOUS | Status: AC | PRN
  Administered 2021-08-09: 8.5 mL via INTRAVENOUS

## 2021-08-09 NOTE — Sepsis Progress Note (Signed)
[facility] is following this code sepsis ?

## 2021-08-09 NOTE — ED Provider Notes (Signed)
[place] DEPT Provider Note   CSN: [id number] Arrival date & time: 08/09/21  [id number]     History  Chief Complaint  Patient presents with   Fatigue   Nasal Congestion    Alexander [name] is a 46 y.o. male.  46 year old male presents with 24 hours of fever, cough, congestion.  Patient has a history of MS and only uses a walker to ambulate.  Due to the fever and weakness he has been unable to get around.  States his temperature was 100.4 at home.  It has not been associated with emesis or diarrhea.  No abdominal or chest discomfort.  No severe headaches or neck pain or photophobia.  No treatment use prior to arrival.  Possible urinary symptoms      Home Medications Prior to Admission medications   Medication Sig Start Date End Date Taking? Authorizing Provider  Ascorbic Acid (VITA-C PO) Take by mouth.    [provider]  atorvastatin (LIPITOR) 20 MG tablet TAKE 1 TABLET BY MOUTH EVERY DAY Patient not taking: Reported on 06/06/2021 04/18/20   Laurey Morale, MD  B Complex Vitamins (VITAMIN B COMPLEX PO) Take by mouth.    [provider]  baclofen (LIORESAL) 10 MG tablet Take up to three times daily as needed 08/28/20   Lomax, Amy, NP  Cholecalciferol (VITAMIN D PO) Take by mouth. Take 5000-7000 units by mouth daily    [provider]  Diroximel Fumarate (VUMERITY) 231 MG CPDR Take 462 mg by mouth in the morning and at bedtime. 09/13/20   Lomax, Amy, NP  escitalopram (LEXAPRO) 20 MG tablet Take 1 tablet (20 mg total) by mouth daily. 08/28/20   Lomax, Amy, NP  MAGNESIUM PO Take by mouth.    [provider]  Multiple Vitamin (MULTIVITAMIN WITH MINERALS) TABS tablet Take 1 tablet by mouth daily.    [provider]  Multiple Vitamins-Minerals (PRESERVISION AREDS 2 PO) Take 1 Dose by mouth daily.    [provider]  Omega-3 Fatty Acids (FISH OIL) 1000 MG CAPS Take 2 capsules by mouth daily.    [provider]  Probiotic Product (PROBIOTIC PO) Take 3-4 capsules by mouth daily. Nature Made    [provider]  Testosterone 20.25 MG/ACT (1.62%) GEL PLACE 4 PUMP ONTO THE SKIN DAILY IN THE AFTERNOON. Patient not taking: Reported on 06/06/2021 01/12/21   Laurey Morale, MD  UNABLE TO FIND Take 500-1,000 mg by mouth daily. CBD oil    [provider]      Allergies    Patient has no known allergies.    Review of Systems   Review of Systems  All other systems reviewed and are negative.  Physical Exam Updated Vital Signs BP 134/87    Pulse (!) 125    Temp 98.9 F (37.2 C)    Resp 18    Ht 1.727 m (5\' 8" )    Wt 77.1 kg    SpO2 97%    BMI 25.85 kg/m  Physical Exam Vitals and nursing note reviewed.  Constitutional:      General: He is not in acute distress.    Appearance: Normal appearance. He is well-developed. He is not toxic-appearing.  HENT:     Head: Normocephalic and atraumatic.  Eyes:     General: Lids are normal.     Conjunctiva/sclera: Conjunctivae normal.     Pupils: Pupils are equal, round, and reactive to light.  Neck:     Thyroid:  No thyroid mass.     Trachea: No tracheal deviation.  Cardiovascular:     Rate and Rhythm: Normal rate and regular rhythm.     Heart sounds: Normal heart sounds. No murmur heard.   No gallop.  Pulmonary:     Effort: Pulmonary effort is normal. No respiratory distress.     Breath sounds: Normal breath sounds. No stridor. No decreased breath sounds, wheezing, rhonchi or rales.  Abdominal:     General: There is no distension.     Palpations: Abdomen is soft.     Tenderness: There is no abdominal tenderness. There is no rebound.  Musculoskeletal:        General: No tenderness. Normal range of motion.     Cervical back: Normal range of motion and neck supple.  Skin:    General: Skin is warm and dry.     Findings: No abrasion or rash.  Neurological:     Mental Status: He is alert and oriented to person, place, and time. Mental  status is at baseline.     GCS: GCS eye subscore is 4. GCS verbal subscore is 5. GCS motor subscore is 6.     Cranial Nerves: No cranial nerve deficit.     Sensory: No sensory deficit.     Motor: Weakness present.  Psychiatric:        Attention and Perception: Attention normal.        Speech: Speech normal.        Behavior: Behavior normal.    ED Results / Procedures / Treatments   Labs (all labs ordered are listed, but only abnormal results are displayed) Labs Reviewed  CULTURE, BLOOD (ROUTINE X 2)  CULTURE, BLOOD (ROUTINE X 2)  RESP PANEL BY RT-PCR (FLU A&B, COVID) ARPGX2  CBC WITH DIFFERENTIAL/PLATELET  COMPREHENSIVE METABOLIC PANEL  URINALYSIS, ROUTINE W REFLEX MICROSCOPIC  LACTIC ACID, PLASMA    EKG None  Radiology No results found.  Procedures Procedures    Medications Ordered in ED Medications  lactated ringers bolus 1,000 mL (has no administration in time range)  lactated ringers infusion (has no administration in time range)    ED Course/ Medical Decision Making/ A&P                           Medical Decision Making Amount and/or Complexity of Data Reviewed Independent Historian: caregiver Radiology: independent interpretation performed.    Details: Consistent with likely pneumonia Discussion of management or test interpretation with external provider(s): Discussed with hospitalist and patient will be admitted  Critical Care Total time providing critical care: 30-74 minutes  Patient here with weakness and increased cough congestion.  Chest x-ray consistent with pneumonia.  He is also COVID-positive here.  Lactate elevated as well 2.  This indicates likely sepsis.  Sepsis protocol started patient given IV fluid bolus and IV antibiotics started.  Patient does not require ICU admission at this time.  Can be admitted to likely stepdown unit.  He is mentating appropriately controlling his airway.        Final Clinical Impression(s) / ED  Diagnoses Final diagnoses:  None    Rx / DC Orders ED Discharge Orders     None         Lacretia Leigh, MD 08/09/21 [phone]

## 2021-08-09 NOTE — H&P (Addendum)
History and Physical    Alexander [name] GEX:[id number] DOB: 1976-06-20 DOA: 08/09/2021  PCP: Nelwyn Salisbury, MD  Patient coming from: Home  Chief Complaint: leg and arm weakness  HPI: Alexander [name] is a 46 y.o. male with medical history significant of MS, anxiety,HLD. Presenting with BLE/BUE weakness. He reports he was in his normal state of health until yesterday. Yesterday morning, he began having fevers and chills. As the day progressed, he noticed that he legs became weak and he couldn't walk. He next noticed increased cough and weakness in his upper extremities. As his symptoms worsened, he became concerned and came to the ED for evaluation. He denies any other aggravating or alleviating factors.   ED Course: CXR shows RLL PNA. He was positive for COVID. He was started on CAP coverage. TRH was called for admission.   Review of Systems:  Denies CP, palpitations, N/V/D, sick contacts, abdominal pain.  Review of systems is otherwise negative for all not mentioned in HPI.   PMHx Past Medical History:  Diagnosis Date   Hypertension    Multiple sclerosis (HCC) sees Dr. Despina Arias   Vision abnormalities     PSHx Past Surgical History:  Procedure Laterality Date   ACHILLES TENDON SURGERY     ANTERIOR CRUCIATE LIGAMENT REPAIR     MENISCUS REPAIR      SocHx  reports that he has never smoked. He has never used smokeless tobacco. He reports that he does not drink alcohol and does not use drugs.  No Known Allergies  FamHx Family History  Problem Relation Age of Onset   Congestive Heart Failure Father    Stroke Maternal Grandfather    Multiple sclerosis Sister     Prior to Admission medications   Medication Sig Start Date End Date Taking? Authorizing Provider  Ascorbic Acid (VITA-C PO) Take 1 tablet by mouth daily.   Yes [provider]  baclofen (LIORESAL) 10 MG tablet Take up to three times daily as needed Patient taking differently: Take 10 mg by mouth 3 (three)  times daily as needed for muscle spasms. 08/28/20  Yes Lomax, Amy, NP  Cholecalciferol (DIALYVITE VITAMIN D 5000) 125 MCG (5000 UT) capsule Take 5,000 Units by mouth daily.   Yes [provider]  Diroximel Fumarate (VUMERITY) 231 MG CPDR Take 462 mg by mouth in the morning and at bedtime. 09/13/20  Yes Lomax, Amy, NP  escitalopram (LEXAPRO) 20 MG tablet Take 1 tablet (20 mg total) by mouth daily. Patient taking differently: Take 20 mg by mouth daily as needed (anxiety). 08/28/20  Yes Lomax, Amy, NP  ibuprofen (ADVIL) 200 MG tablet Take 400-600 mg by mouth every 6 (six) hours as needed for headache, fever or mild pain.   Yes [provider]  MAGNESIUM PO Take 1 tablet by mouth daily.   Yes [provider]  Multiple Vitamin (MULTIVITAMIN WITH MINERALS) TABS tablet Take 1 tablet by mouth daily.   Yes [provider]  Omega-3 Fatty Acids (FISH OIL) 1000 MG CAPS Take 2,000 mg by mouth daily.   Yes [provider]  Probiotic Product (PROBIOTIC PO) Take 3 capsules by mouth daily. Nature Made   Yes [provider]  Pseudoeph-CPM-DM-APAP (THERAFLU FLU/COLD/COUGH PO) Take 1 tablet by mouth 2 (two) times daily as needed (congestion).   Yes [provider]  UNABLE TO FIND Take 500-1,000 mg by mouth daily. CBD oil   Yes [provider]  atorvastatin (LIPITOR) 20 MG tablet TAKE 1 TABLET BY  MOUTH EVERY DAY Patient not taking: Reported on 06/06/2021 04/18/20   Nelwyn Salisbury, MD    Physical Exam: Vitals:   08/09/21 0700 08/09/21 0701 08/09/21 0715 08/09/21 0936  BP: 133/89 133/89 134/87 133/87  Pulse: (!) 131 (!) 130 (!) 125 (!) 139  Resp:  18  15  Temp:  98.9 F (37.2 C)    SpO2: 97% 97% 97% 96%  Weight:  77.1 kg    Height:  5\' 8"  (1.727 m)      General: 46 y.o. male resting in bed in NAD Eyes: PERRL, normal sclera ENMT: Nares patent w/o discharge, orophaynx clear, dentition normal, ears w/o discharge/lesions/ulcers Neck: Supple,  trachea midline Cardiovascular: RRR, +S1, S2, no m/g/r, equal pulses throughout Respiratory: CTABL, no w/r/r, normal WOB GI: BS+, NDNT, no masses noted, no organomegaly noted MSK: No e/c/c Neuro: A&O x 3, BLE msk str 3/5, BUE msk str 4-5/5 Psyc: Appropriate interaction and affect, calm/cooperative  Labs on Admission: I have personally reviewed following labs and imaging studies  CBC: Recent Labs  Lab 08/09/21 0800  WBC 9.0  NEUTROABS 7.6  HGB 16.2  HCT 50.1  MCV 88.0  PLT 142*   Basic Metabolic Panel: Recent Labs  Lab 08/09/21 0800  NA 136  K 4.1  CL 103  CO2 21*  GLUCOSE 126*  BUN 9  CREATININE 0.81  CALCIUM 8.7*   GFR: Estimated Creatinine Clearance: 109.1 mL/min (by C-G formula based on SCr of 0.81 mg/dL). Liver Function Tests: Recent Labs  Lab 08/09/21 0800  AST 23  ALT 28  ALKPHOS 44  BILITOT 1.2  PROT 7.4  ALBUMIN 4.3   No results for input(s): LIPASE, AMYLASE in the last 168 hours. No results for input(s): AMMONIA in the last 168 hours. Coagulation Profile: No results for input(s): INR, PROTIME in the last 168 hours. Cardiac Enzymes: No results for input(s): CKTOTAL, CKMB, CKMBINDEX, TROPONINI in the last 168 hours. BNP (last 3 results) No results for input(s): PROBNP in the last 8760 hours. HbA1C: No results for input(s): HGBA1C in the last 72 hours. CBG: No results for input(s): GLUCAP in the last 168 hours. Lipid Profile: No results for input(s): CHOL, HDL, LDLCALC, TRIG, CHOLHDL, LDLDIRECT in the last 72 hours. Thyroid Function Tests: No results for input(s): TSH, T4TOTAL, FREET4, T3FREE, THYROIDAB in the last 72 hours. Anemia Panel: No results for input(s): VITAMINB12, FOLATE, FERRITIN, TIBC, IRON, RETICCTPCT in the last 72 hours. Urine analysis:    Component Value Date/Time   COLORURINE YELLOW 08/09/2021 0943   APPEARANCEUR CLEAR 08/09/2021 0943   LABSPEC 1.014 08/09/2021 0943   PHURINE 6.0 08/09/2021 0943   GLUCOSEU NEGATIVE  08/09/2021 0943   HGBUR NEGATIVE 08/09/2021 0943   BILIRUBINUR NEGATIVE 08/09/2021 0943   BILIRUBINUR - 05/10/2019 1158   KETONESUR 80 (A) 08/09/2021 0943   PROTEINUR NEGATIVE 08/09/2021 0943   UROBILINOGEN negative (A) 05/10/2019 1158   NITRITE NEGATIVE 08/09/2021 0943   LEUKOCYTESUR NEGATIVE 08/09/2021 0943    Radiological Exams on Admission: DG Chest Port 1 View  Result Date: 08/09/2021 CLINICAL DATA:  Shortness of breath. congestion. EXAM: PORTABLE CHEST 1 VIEW COMPARISON:  None. FINDINGS: 0743 hours. Low lung volumes. Patchy airspace disease noted medial right lung base, suspicious for pneumonia. Probable atelectasis left base. No edema or substantial pleural effusion. The cardiopericardial silhouette is within normal limits for size. The visualized bony structures of the thorax show no acute abnormality. IMPRESSION: Low volume film with patchy airspace opacity medial right lung base, suspicious for  pneumonia. Follow-up imaging recommended to ensure resolution. Electronically Signed   By: Kennith Center M.D.   On: 08/09/2021 07:57    EKG: None obtained in ED  Assessment/Plan RLL PNA: COVID vs CAP     - admit to inpt, tele     - started on CAP coverage; continue for now     - check procal     - will also start remdes, steroids, nebs, guaifenesin, IS     - lactic acid is elevated; give fluids  MS; possible flare BUE/BLE weakness     - hard to tell if this is just weakness secondary to infection or a true flare     - getting steroids w/ COVID tx, but obviously at a lower dose     - d/w neuro; rec'd MR Brain, C &T; if lesions noted, will call back and initiate higher dose steroids  Thrombocytopenia     - mild, no evidence of bleed, follow  Anxiety/depression     - continue home regimen  DVT prophylaxis: lovenox  Code Status: FULL  Family Communication: None at bedside  Consults called: Neuro consulted   Status is: Inpatient  Remains inpatient appropriate because: severity  of illness  Teddy Spike DO [facility]  If 7PM-7AM, please contact night-coverage [email or web address]  08/09/2021, 12:28 PM

## 2021-08-09 NOTE — ED Triage Notes (Signed)
Patient BIB EMS for evaluation of congestion, fatigue, and increased weakness with MS.  Patient states symptoms x 1 day.

## 2021-08-09 NOTE — ED Notes (Signed)
Patient transported to MRI 

## 2021-08-09 NOTE — ED Notes (Signed)
RN and this Clinical research associate assisted pt with moving to bedside commode. Linen's changed. Call light within reach so pt can notify us when he is finished.

## 2021-08-09 NOTE — ED Notes (Signed)
Pt returned from MRI °

## 2021-08-10 ENCOUNTER — Telehealth: Payer: Self-pay | Admitting: Neurology

## 2021-08-10 ENCOUNTER — Encounter (HOSPITAL_COMMUNITY): Payer: Self-pay | Admitting: Internal Medicine

## 2021-08-10 ENCOUNTER — Encounter: Payer: Self-pay | Admitting: Neurology

## 2021-08-10 ENCOUNTER — Other Ambulatory Visit: Payer: Self-pay

## 2021-08-10 DIAGNOSIS — J189 Pneumonia, unspecified organism: Secondary | ICD-10-CM

## 2021-08-10 LAB — CBC WITH DIFFERENTIAL/PLATELET
Abs Immature Granulocytes: 0.01 10*3/uL (ref 0.00–0.07)
Basophils Absolute: 0 10*3/uL (ref 0.0–0.1)
Basophils Relative: 0 %
Eosinophils Absolute: 0 10*3/uL (ref 0.0–0.5)
Eosinophils Relative: 0 %
HCT: 39.1 % (ref 39.0–52.0)
Hemoglobin: 13.5 g/dL (ref 13.0–17.0)
Immature Granulocytes: 0 %
Lymphocytes Relative: 4 %
Lymphs Abs: 0.3 10*3/uL — ABNORMAL LOW (ref 0.7–4.0)
MCH: 29.2 pg (ref 26.0–34.0)
MCHC: 34.5 g/dL (ref 30.0–36.0)
MCV: 84.4 fL (ref 80.0–100.0)
Monocytes Absolute: 0.4 10*3/uL (ref 0.1–1.0)
Monocytes Relative: 6 %
Neutro Abs: 6.9 10*3/uL (ref 1.7–7.7)
Neutrophils Relative %: 90 %
Platelets: 143 10*3/uL — ABNORMAL LOW (ref 150–400)
RBC: 4.63 MIL/uL (ref 4.22–5.81)
RDW: 13.1 % (ref 11.5–15.5)
WBC: 7.6 10*3/uL (ref 4.0–10.5)
nRBC: 0 % (ref 0.0–0.2)

## 2021-08-10 LAB — COMPREHENSIVE METABOLIC PANEL
ALT: 37 U/L (ref 0–44)
AST: 30 U/L (ref 15–41)
Albumin: 3.3 g/dL — ABNORMAL LOW (ref 3.5–5.0)
Alkaline Phosphatase: 35 U/L — ABNORMAL LOW (ref 38–126)
Anion gap: 8 (ref 5–15)
BUN: 13 mg/dL (ref 6–20)
CO2: 26 mmol/L (ref 22–32)
Calcium: 8.8 mg/dL — ABNORMAL LOW (ref 8.9–10.3)
Chloride: 104 mmol/L (ref 98–111)
Creatinine, Ser: 0.83 mg/dL (ref 0.61–1.24)
GFR, Estimated: >60 mL/min (ref >60)
Glucose, Bld: 148 mg/dL — ABNORMAL HIGH (ref 70–99)
Potassium: 4 mmol/L (ref 3.5–5.1)
Sodium: 138 mmol/L (ref 135–145)
Total Bilirubin: 1.3 mg/dL — ABNORMAL HIGH (ref 0.3–1.2)
Total Protein: 6.2 g/dL — ABNORMAL LOW (ref 6.5–8.1)

## 2021-08-10 LAB — C-REACTIVE PROTEIN: CRP: 22.7 mg/dL — ABNORMAL HIGH (ref <1.0)

## 2021-08-10 LAB — PROCALCITONIN: Procalcitonin: 1.16 ng/mL

## 2021-08-10 LAB — HIV ANTIBODY (ROUTINE TESTING W REFLEX): HIV Screen 4th Generation wRfx: NONREACTIVE

## 2021-08-10 LAB — FERRITIN: Ferritin: 169 ng/mL (ref 24–336)

## 2021-08-10 LAB — D-DIMER, QUANTITATIVE: D-Dimer, Quant: 1.35 ug/mL-FEU — ABNORMAL HIGH (ref 0.00–0.50)

## 2021-08-10 MED ORDER — ASCORBIC ACID 500 MG PO TABS
500.0000 mg | ORAL_TABLET | Freq: Every day | ORAL | Status: DC
  Administered 2021-08-10 – 2021-08-12 (×3): 500 mg via ORAL
  Filled 2021-08-10 (×3): qty 1

## 2021-08-10 MED ORDER — PREDNISONE 50 MG PO TABS: 50.0000 mg | ORAL_TABLET | Freq: Every day | ORAL | Status: DC

## 2021-08-10 MED ORDER — ONDANSETRON HCL 4 MG PO TABS: 4.0000 mg | ORAL_TABLET | Freq: Four times a day (QID) | ORAL | Status: DC | PRN

## 2021-08-10 MED ORDER — SODIUM CHLORIDE 0.9 % IV SOLN: 100.0000 mg | Freq: Every day | INTRAVENOUS | Status: DC

## 2021-08-10 MED ORDER — IPRATROPIUM-ALBUTEROL 20-100 MCG/ACT IN AERS
1.0000 | INHALATION_SPRAY | Freq: Four times a day (QID) | RESPIRATORY_TRACT | Status: DC
  Administered 2021-08-10 (×3): 1 via RESPIRATORY_TRACT
  Filled 2021-08-10: qty 4

## 2021-08-10 MED ORDER — OMEGA-3-ACID ETHYL ESTERS 1 G PO CAPS
1.0000 g | ORAL_CAPSULE | Freq: Every day | ORAL | Status: DC
  Administered 2021-08-10 – 2021-08-12 (×3): 1 g via ORAL
  Filled 2021-08-10 (×3): qty 1

## 2021-08-10 MED ORDER — BACLOFEN 10 MG PO TABS
10.0000 mg | ORAL_TABLET | Freq: Three times a day (TID) | ORAL | Status: DC | PRN
  Filled 2021-08-10: qty 1

## 2021-08-10 MED ORDER — ESCITALOPRAM OXALATE 20 MG PO TABS
20.0000 mg | ORAL_TABLET | Freq: Every day | ORAL | Status: DC | PRN
  Administered 2021-08-11: 20 mg via ORAL
  Filled 2021-08-10 (×2): qty 1

## 2021-08-10 MED ORDER — HYDROCOD POLST-CPM POLST ER 10-8 MG/5ML PO SUER: 5.0000 mL | Freq: Two times a day (BID) | ORAL | Status: DC | PRN

## 2021-08-10 MED ORDER — ADULT MULTIVITAMIN W/MINERALS CH
1.0000 | ORAL_TABLET | Freq: Every day | ORAL | Status: DC
  Administered 2021-08-10 – 2021-08-12 (×3): 1 via ORAL
  Filled 2021-08-10 (×3): qty 1

## 2021-08-10 MED ORDER — ENOXAPARIN SODIUM 40 MG/0.4ML IJ SOSY
40.0000 mg | PREFILLED_SYRINGE | INTRAMUSCULAR | Status: DC
  Administered 2021-08-10 – 2021-08-11 (×2): 40 mg via SUBCUTANEOUS
  Filled 2021-08-10 (×2): qty 0.4

## 2021-08-10 MED ORDER — SODIUM CHLORIDE 0.9 % IV SOLN
100.0000 mg | Freq: Every day | INTRAVENOUS | Status: DC
  Administered 2021-08-11 – 2021-08-12 (×2): 100 mg via INTRAVENOUS
  Filled 2021-08-10 (×2): qty 20

## 2021-08-10 MED ORDER — VITAMIN D3 25 MCG (1000 UNIT) PO TABS
5000.0000 [IU] | ORAL_TABLET | Freq: Every day | ORAL | Status: DC
  Administered 2021-08-10 – 2021-08-12 (×3): 5000 [IU] via ORAL
  Filled 2021-08-10 (×3): qty 5

## 2021-08-10 MED ORDER — DIROXIMEL FUMARATE 231 MG PO CPDR
462.0000 mg | DELAYED_RELEASE_CAPSULE | Freq: Two times a day (BID) | ORAL | Status: DC
  Administered 2021-08-10 – 2021-08-12 (×5): 462 mg via ORAL
  Filled 2021-08-10: qty 2

## 2021-08-10 MED ORDER — ONDANSETRON HCL 4 MG/2ML IJ SOLN: 4.0000 mg | Freq: Four times a day (QID) | INTRAMUSCULAR | Status: DC | PRN

## 2021-08-10 MED ORDER — SODIUM CHLORIDE 0.9 % IV SOLN: 200.0000 mg | Freq: Once | INTRAVENOUS | Status: DC

## 2021-08-10 MED ORDER — SODIUM CHLORIDE 0.9 % IV SOLN
100.0000 mg | Freq: Once | INTRAVENOUS | Status: AC
  Administered 2021-08-10: 100 mg via INTRAVENOUS
  Filled 2021-08-10: qty 20

## 2021-08-10 MED ORDER — METHYLPREDNISOLONE SODIUM SUCC 125 MG IJ SOLR
1.0000 mg/kg | Freq: Two times a day (BID) | INTRAMUSCULAR | Status: DC
  Administered 2021-08-10: 76.875 mg via INTRAVENOUS
  Filled 2021-08-10: qty 2

## 2021-08-10 MED ORDER — ACETAMINOPHEN 325 MG PO TABS: 650.0000 mg | ORAL_TABLET | Freq: Four times a day (QID) | ORAL | Status: DC | PRN

## 2021-08-10 MED ORDER — IPRATROPIUM-ALBUTEROL 20-100 MCG/ACT IN AERS
1.0000 | INHALATION_SPRAY | Freq: Two times a day (BID) | RESPIRATORY_TRACT | Status: DC
  Administered 2021-08-11 – 2021-08-12 (×2): 1 via RESPIRATORY_TRACT
  Filled 2021-08-10: qty 4

## 2021-08-10 MED ORDER — GUAIFENESIN-DM 100-10 MG/5ML PO SYRP
10.0000 mL | ORAL_SOLUTION | ORAL | Status: DC | PRN
  Filled 2021-08-10: qty 10

## 2021-08-10 MED ORDER — ZINC SULFATE 220 (50 ZN) MG PO CAPS
220.0000 mg | ORAL_CAPSULE | Freq: Every day | ORAL | Status: DC
  Administered 2021-08-10 – 2021-08-12 (×3): 220 mg via ORAL
  Filled 2021-08-10 (×3): qty 1

## 2021-08-10 MED ORDER — REMDESIVIR 100 MG IV SOLR
100.0000 mg | Freq: Once | INTRAVENOUS | Status: AC
  Administered 2021-08-10: 100 mg via INTRAVENOUS
  Filled 2021-08-10: qty 20

## 2021-08-10 NOTE — Evaluation (Signed)
Physical Therapy Evaluation Patient Details Name: Alexander [name] MRN: [id number] DOB: 21-Nov-1975 Today's Date: 08/10/2021  History of Present Illness  46 year old male with medical history significant for multiple sclerosis, anxiety, HLD, presented to the ED on 08/09/2020 with complaints of fevers, chills, cough, followed by bilateral upper extremity and lower extremity weakness to a point where he could not ambulate.  Admitted for pneumonia, also positive for COVID-19 with associated weakness.  MRI brain, C and T-spine without acute MS flare.  Clinical Impression  Pt requiring a lot of assist at this time due to MS exacerbation and get tone, weakness and spacticity in BLE at this time. PT does report that is however improving and hopes it will continue to improve over next few days in order to have a safe DC home with his mother being able to assist a little . Will benefit from PT and will continue to follow while her in acute care, then recommend HHPT upon DC       Recommendations for follow up therapy are one component of a multi-disciplinary discharge planning process, led by the attending physician.  Recommendations may be updated based on patient status, additional functional criteria and insurance authorization.  Follow Up Recommendations Home health PT    Assistance Recommended at Discharge Frequent or constant Supervision/Assistance  Patient can return home with the following  A lot of help with walking and/or transfers;A lot of help with bathing/dressing/bathroom    Equipment Recommendations None recommended by PT  Recommendations for Other Services       Functional Status Assessment Patient has had a recent decline in their functional status and demonstrates the ability to make significant improvements in function in a reasonable and predictable amount of time.     Precautions / Restrictions Precautions Precautions: Fall Restrictions Weight Bearing Restrictions: No       Mobility  Bed Mobility Overal bed mobility: Needs Assistance Bed Mobility: Supine to Sit;Sit to Supine     Supine to sit: Mod assist;HOB elevated Sit to supine: Mod assist   General bed mobility comments: great extensor tone and had to really help with trunk flexion and hip flexion to come to sitting. Mod-MAx Assist with hips scooting them to the edge and turnign pt.Sit to supine , PT had to assit BLE total assist swiveling pt back in bed supine.    Transfers Overall transfer level: Needs assistance Equipment used: Rolling walker (2 wheels) Transfers: Sit to/from Stand Sit to Stand: Min guard           General transfer comment: Min guard to stand with RW. Min assist for intermittent steadying assist to ambulate in room with RW.    Ambulation/Gait Ambulation/Gait assistance: Mod assist Gait Distance (Feet): 20 Feet (limited due to COVID and in ED in room ambulation.) Assistive device: Rolling walker (2 wheels) Gait Pattern/deviations: Narrow base of support;Step-through pattern;Decreased stride length       General Gait Details: small steps, loss of balance with turning and unable to self correct with Mod A at this point.  Stairs            Wheelchair Mobility    Modified Rankin (Stroke Patients Only)       Balance Overall balance assessment: Needs assistance Sitting-balance support: No upper extremity supported Sitting balance-Leahy Scale: Good     Standing balance support: Reliant on assistive device for balance Standing balance-Leahy Scale: Poor  Pertinent Vitals/Pain Pain Assessment: No/denies pain    Home Living Family/patient expects to be discharged to:: Private residence Living Arrangements: Parent Available Help at Discharge: Family Type of Home: House Home Access: Stairs to enter   Secretary/administrator of Steps: 1   Home Layout: One level Home Equipment: Grab bars - tub/shower;Shower  seat;Rollator (4 wheels);Transport chair      Prior Function Prior Level of Function : Independent/Modified Independent             Mobility Comments: uses a rollator , and was driving until about 3 weeks ago. ADLs Comments: independent with ADLs     Hand Dominance   Dominant Hand: Right    Extremity/Trunk Assessment   Upper Extremity Assessment Upper Extremity Assessment: RUE deficits/detail;LUE deficits/detail RUE Deficits / Details: WNL ROM, 5/5 strength throughout RUE Sensation: WNL RUE Coordination: WNL LUE Deficits / Details: WNL ROM, 5/5 shoulder, 4+/5 BICEP, 5/5 tricep, 4/5 wrist, 4/5 grip (spasticity in LUE) LUE Sensation: WNL LUE Coordination: decreased gross motor;decreased fine motor (spasticity effects coordination)    Lower Extremity Assessment Lower Extremity Assessment: RLE deficits/detail;LLE deficits/detail RLE Deficits / Details: Greater tone and spasticity in R LE versus LLE. In extensor pattern, and pt unable break extension independently. With PROM in DF great clonus set in, but was relieved with pressure to the achilles tendon with the stretch. DF tight not quite to neutral ( -10) , hamstring very tight preventing a lot of PROM. PT able to perfrom som active DF and PF but clonus and tone started with active movement. Worked on stretching and relaxing PROM and was able to get R  SLR to 30 degree elevation with hip flexion due to tight hamstrings, and was able to get knee felxion to about 80 degree flexion hooklying. RLE Sensation: WNL RLE Coordination:  (unable to assess due to tone and spacity and lack of AROM in BLEs) LLE Deficits / Details: Less extensor tone and spacitity in LLE than RLE. In extensor pattern, and pt unable break extension independently. With PROM in DF great clonus set in, but was relieved with pressure to the achilles tendon with the stretch. DF tight not quite to neutral ( -10) , hamstring very tight preventing a lot of PROM. Pt able to  perform  active DF and PF but clonus and tone started with active movement. Worked on stretching and relaxing PROM and was able to get LLE  SLR to 50 degree elevation with hip flexion due to tight hamstrings, and was able to get knee flexionto about 90 degree flexion hooklying with a lot of time and stretching techniques from the PT. LLE Sensation: WNL    Cervical / Trunk Assessment Cervical / Trunk Assessment: Normal  Communication   Communication: No difficulties  Cognition Arousal/Alertness: Awake/alert Behavior During Therapy: WFL for tasks assessed/performed Overall Cognitive Status: Within Functional Limits for tasks assessed                                          General Comments      Exercises Other Exercises Other Exercises: worked with pt supine with BLE for gentle BLE stretching for DF, hamstrings, hip flexion adn knee flexion.   Assessment/Plan    PT Assessment Patient needs continued PT services  PT Problem List Decreased strength;Decreased range of motion;Decreased activity tolerance;Decreased balance;Decreased mobility       PT Treatment Interventions Gait training;Functional  mobility training;Therapeutic activities;Therapeutic exercise;Balance training;Neuromuscular re-education;Patient/family education    PT Goals (Current goals can be found in the Care Plan section)  Acute Rehab PT Goals Patient Stated Goal: I want to get back to my normal so I don't have to relie on my mom and I don't want her to hurt herself helping me PT Goal Formulation: With patient Time For Goal Achievement: 08/24/21 Potential to Achieve Goals: Good    Frequency Min 4X/week     Co-evaluation               AM-PAC PT "6 Clicks" Mobility  Outcome Measure Help needed turning from your back to your side while in a flat bed without using bedrails?: A Little Help needed moving from lying on your back to sitting on the side of a flat bed without using bedrails?: A  Little Help needed moving to and from a bed to a chair (including a wheelchair)?: A Little Help needed standing up from a chair using your arms (e.g., wheelchair or bedside chair)?: A Little Help needed to walk in hospital room?: A Little Help needed climbing 3-5 steps with a railing? : A Little 6 Click Score: 18    End of Session   Activity Tolerance: Patient tolerated treatment well Patient left: in bed;with call bell/phone within reach Nurse Communication: Mobility status PT Visit Diagnosis: Unsteadiness on feet (R26.81);Muscle weakness (generalized) (M62.81)    Time: 5366-4403 PT Time Calculation (min) (ACUTE ONLY): 34 min   Charges:   PT Evaluation $PT Eval Low Complexity: 1 Low PT Treatments $Therapeutic Exercise: 23-37 mins        Alexander [name], PT, MPT Acute Rehabilitation Services Office: [phone] Pager: [phone] 08/10/2021   Alexander [name] 08/10/2021, 7:15 PM

## 2021-08-10 NOTE — Telephone Encounter (Signed)
Pt's mother, Jhon Mallozzi; pt is in the ER waiting for a bed to be admitted due to Covid and pneumonia. He is depressed because of mobility in hands and legs. Wanted to let Dr. Epimenio Foot know.

## 2021-08-10 NOTE — Final Progress Note (Signed)
PT Note  Patient Details Name: Alexander [name] MRN: [id number] DOB: 25-Jun-1976   Assessed pt this afternoon 15:11-1545pm, full write up to follow. He is still requiring a lot of assist at this time, however improving greatly from yesterday. Once stable for DC home recommending HHPT to follow for continued rehab. Will continue to follow while here.    Marella Bile 08/10/2021, 5:35 PM

## 2021-08-10 NOTE — Progress Notes (Addendum)
PROGRESS NOTE   Alexander [name]  [id number]    DOB: 09-21-1975    DOA: 08/09/2021  PCP: Laurey Morale, MD   I have briefly reviewed patients previous medical records in [facility].  Chief Complaint  Patient presents with   Fatigue   Nasal Congestion    Brief Narrative:  46 year old male with medical history significant for multiple sclerosis, anxiety, HLD, presented to the ED on 08/09/2020 with complaints of fevers, chills, cough, followed by bilateral upper extremity and lower extremity weakness to a point where he could not ambulate.  Admitted for pneumonia, also positive for COVID-19 with associated weakness.  MRI brain, C and T-spine without acute MS flare.   Assessment & Plan:  Principal Problem:   COVID-19   Lobar pneumonia DD: Bacterial pneumonia (felt likely) versus COVID-19 Chest x-ray 1/5: Patchy airspace opacity medial right lung base, suspicious for pneumonia. No leukocytosis.  Lactate 2.  CRP 22.7.  Procalcitonin 1.16. COVID-19 RT-PCR positive. Blood cultures x2 pending. Continue empirically started IV ceftriaxone, azithromycin, Remdesvir.  As discussed with pharmacy, since on IV remdesivir, DC'ed Paxlovid. Was on high-dose IV steroids.  Since not hypoxic and no MS flare, discontinued steroids. Clinically improving. Recommend repeating chest x-ray in 4 weeks to ensure resolution of pneumonia findings. Aspiration pneumonia considered given transient possible bulbar weakness but felt less likely.  Multiple sclerosis Follows with Dr. Felecia Shelling, [facility]. There was concern for MS flare due to worsening extremity weakness. Admitting MD discussed case with neurology, obtained MRI brain, cervical and thoracic spine which did not show any acute demyelinating lesions. Suspect his worsening weakness was due to acute infectious etiology as above. Improving.  Ordered PT evaluation.  Thrombocytopenia Stable.  Dyslipidemia Continue  statins  Anxiety disorder Continue Lexapro.  Body mass index is 25.85 kg/m.    DVT prophylaxis: enoxaparin (LOVENOX) injection 40 mg Start: 08/10/21 1000     Code Status: Full Code Family Communication: None at bedside. Disposition:  Status is: Inpatient  Remains inpatient appropriate because: Further IV treatments for community-acquired pneumonia and COVID-19        Consultants:   None  Procedures:   None  Antimicrobials:   As noted above  Subjective:  Overall feels much better.  Minimal intermittent dry cough.  Denies history of choking or aspiration.  All extremities strength is significantly improved but not back to baseline.  Feels that his strength is 50% better than on admission.  Reports that prior to admission, he had a week voice and cough which has now significantly improved.  Objective:   Vitals:   08/10/21 0745 08/10/21 0800 08/10/21 0808 08/10/21 0900  BP:  (!) 144/90 (!) 144/90 137/81  Pulse: 92 98 98 99  Resp:   18 17  Temp:      SpO2: 96% 93% 93% 96%  Weight:      Height:        General exam: Young male, moderately built, thin and frail, lying comfortably propped up in bed without distress. Respiratory system: Slightly diminished breath sounds in the bases but otherwise clear to auscultation.  No increased work of breathing. Cardiovascular system: S1 & S2 heard, RRR. No JVD, murmurs, rubs, gallops or clicks. No pedal edema. Gastrointestinal system: Abdomen is nondistended, soft and nontender. No organomegaly or masses felt. Normal bowel sounds heard. Central nervous system: Alert and oriented. No focal neurological deficits. Extremities: Grade 4 x 4 power in upper extremities, at least grade 3 x 3 power in lower extremities.  Skin: No rashes, lesions or ulcers Psychiatry: Judgement and insight appear normal. Mood & affect appropriate.     Data Reviewed:   I have personally reviewed following labs and imaging studies   CBC: Recent Labs   Lab 2021-09-03 0800 08/10/21 0328  WBC 9.0 7.6  NEUTROABS 7.6 6.9  HGB 16.2 13.5  HCT 50.1 39.1  MCV 88.0 84.4  PLT 142* 143*    Basic Metabolic Panel: Recent Labs  Lab 09/03/21 0800 08/10/21 0328  NA 136 138  K 4.1 4.0  CL 103 104  CO2 21* 26  GLUCOSE 126* 148*  BUN 9 13  CREATININE 0.81 0.83  CALCIUM 8.7* 8.8*    Liver Function Tests: Recent Labs  Lab 09/03/21 0800 08/10/21 0328  AST 23 30  ALT 28 37  ALKPHOS 44 35*  BILITOT 1.2 1.3*  PROT 7.4 6.2*  ALBUMIN 4.3 3.3*    CBG: No results for input(s): GLUCAP in the last 168 hours.  Microbiology Studies:   Recent Results (from the past 240 hour(s))  Culture, blood (Routine X 2) w Reflex to ID Panel     Status: None (Preliminary result)   Collection Time: September 03, 2021  8:00 AM   Specimen: BLOOD RIGHT WRIST  Result Value Ref Range Status   Specimen Description   Final    BLOOD RIGHT WRIST Performed at [facility] [address] [place], [place] [address]    Special Requests   Final    BOTTLES DRAWN AEROBIC AND ANAEROBIC Blood Culture results may not be optimal due to an inadequate volume of blood received in culture bottles Performed at [facility] [address], [place], [place] [address]    Culture   Final    NO GROWTH < 24 HOURS Performed at [facility] [address], [place], [place] [address]    Report Status PENDING  Incomplete  Resp Panel by RT-PCR (Flu A&B, Covid) Peripheral     Status: Abnormal   Collection Time: 09-03-2021  8:00 AM   Specimen: Peripheral; Nasopharyngeal(NP) swabs in vial transport medium  Result Value Ref Range Status   SARS Coronavirus 2 by RT PCR POSITIVE (A) NEGATIVE Final    Comment: (NOTE) SARS-CoV-2 target nucleic acids are DETECTED.  The SARS-CoV-2 RNA is generally detectable in upper respiratory specimens during the acute phase of infection. Positive results are indicative of the presence of the identified virus, but  do not rule out bacterial infection or co-infection with other pathogens not detected by the test. Clinical correlation with patient history and other diagnostic information is necessary to determine patient infection status. The expected result is Negative.  Fact Sheet for Patients: EntrepreneurPulse.com.au  Fact Sheet for Healthcare Providers: IncredibleEmployment.be  This test is not yet approved or cleared by the [place] FDA and  has been authorized for detection and/or diagnosis of SARS-CoV-2 by FDA under an Emergency Use Authorization (EUA).  This EUA will remain in effect (meaning this test can be used) for the duration of  the COVID-19 declaration under Section 564(b)(1) of the A ct, 21 U.S.C. section 360bbb-3(b)(1), unless the authorization is terminated or revoked sooner.     Influenza A by PCR NEGATIVE NEGATIVE Final   Influenza B by PCR NEGATIVE NEGATIVE Final    Comment: (NOTE) The Xpert Xpress SARS-CoV-2/FLU/RSV plus assay is intended as an aid in the diagnosis of influenza from Nasopharyngeal swab specimens and should not be used as a sole basis for treatment. Nasal washings  and aspirates are unacceptable for Xpert Xpress SARS-CoV-2/FLU/RSV testing.  Fact Sheet for Patients: EntrepreneurPulse.com.au  Fact Sheet for Healthcare Providers: IncredibleEmployment.be  This test is not yet approved or cleared by the [place] FDA and has been authorized for detection and/or diagnosis of SARS-CoV-2 by FDA under an Emergency Use Authorization (EUA). This EUA will remain in effect (meaning this test can be used) for the duration of the COVID-19 declaration under Section 564(b)(1) of the Act, 21 U.S.C. section 360bbb-3(b)(1), unless the authorization is terminated or revoked.  Performed at [facility], [place] [address], [place], [place] [address]     Radiology Studies:   MR BRAIN W WO CONTRAST  Result Date: 08/10/2021 CLINICAL DATA:  Initial evaluation for multiple sclerosis. EXAM: MRI HEAD WITHOUT AND WITH CONTRAST TECHNIQUE: Multiplanar, multiecho pulse sequences of the brain and surrounding structures were obtained without and with intravenous contrast. CONTRAST:  8.52mL GADAVIST GADOBUTROL 1 MMOL/ML IV SOLN COMPARISON:  MRI from 09/21/2019. FINDINGS: Brain: Examination somewhat technically limited by motion artifact. Additionally, an axial DWI sequence is not available for review at time of this dictation. Mildly advanced cerebral atrophy for age, stable. Again seen are multiple scattered T2/FLAIR hyperintense foci involving the brainstem, with involvement of the medulla, pons, and midbrain. Patchy involvement of the cerebellum with involvement of the left middle cerebellar peduncle. Few scattered foci seen involving the periventricular and deep white matter of both cerebral hemispheres. Findings are presumably related to history of chronic demyelinating disease. No abnormal enhancement to suggest active demyelination. In comparison with previous exam, there appears to be at least 1 new focus involving the central midbrain w (series 17, image 19), not definitely seen on prior study, suggesting minimal disease progression. No associated enhancement or restricted diffusion to suggest active demyelination. No evidence for acute or subacute infarct. Gray-white matter differentiation otherwise maintained. No areas of chronic cortical infarction. No evidence for acute or chronic intracranial hemorrhage. No mass lesion, mass effect or midline shift. No hydrocephalus or extra-axial fluid collection. Pituitary gland suprasellar region normal. Midline structures intact and normal. Vascular: Major intracranial vascular flow voids are maintained. Skull and upper cervical spine: Craniocervical junction within normal limits. Partially visualized upper cervical spinal cord unremarkable. Bone  marrow signal intensity within normal limits. No scalp soft tissue abnormality. Sinuses/Orbits: Globes and orbital soft tissues within normal limits. Mild scattered mucosal thickening noted within the ethmoidal air cells. Paranasal sinuses are otherwise clear. No significant mastoid effusion. Inner ear structures grossly normal. Other: None. IMPRESSION: 1. Few scattered T2/FLAIR hyperintense foci involving the supratentorial and infratentorial cerebral white matter, consistent with history of chronic demyelinating disease. In comparison with previous exam, there appears to be at least 1 new focus involving the central midbrain, not definitely seen on prior study, suggesting minimal disease progression. No associated enhancement or restricted diffusion to suggest active demyelination. 2. Underlying mildly advanced cerebral atrophy for age, stable. 3. No other acute intracranial abnormality. Electronically Signed   By: Jeannine Boga M.D.   On: 08/10/2021 01:47   MR CERVICAL SPINE W WO CONTRAST  Result Date: 08/10/2021 CLINICAL DATA:  Initial evaluation for multiple sclerosis, MS flare. EXAM: MRI CERVICAL SPINE WITHOUT AND WITH CONTRAST TECHNIQUE: Multiplanar and multiecho pulse sequences of the cervical spine, to include the craniocervical junction and cervicothoracic junction, were obtained without and with intravenous contrast. CONTRAST:  8.30mL GADAVIST GADOBUTROL 1 MMOL/ML IV SOLN COMPARISON:  Comparison made with previous MRI from 09/21/2019. FINDINGS: Alignment: Physiologic with preservation of the normal cervical  lordosis. Trace retrolisthesis of C3 on C4, stable. Vertebrae: Vertebral body height maintained without acute or interval fracture. Bone marrow signal intensity within normal limits. No discrete or worrisome osseous lesions. No abnormal marrow edema. Cord: Evaluation of the cervical spinal cord is mildly limited by motion artifact on this exam. Patchy signal abnormality again noted involving  the visualized medulla and brainstem. Within the cord itself, patchy and hazy signal abnormality seen extending from C2 through approximately C7-T1, relatively similar in appearance as compared to previous. No definite new lesions or evidence for significant disease progression. No associated enhancement to suggest active demyelination. Mild cord atrophy at the level of C4 through C6 noted, similar. Posterior Fossa, vertebral arteries, paraspinal tissues: Craniocervical junction within normal limits. Paraspinous soft tissues normal. Normal flow voids seen within the vertebral arteries bilaterally. Disc levels: C2-C3: Small central disc protrusion indents the ventral thecal sac. No significant spinal stenosis or cord deformity. Foramina remain patent. C3-C4: Broad-based posterior disc osteophyte complex flattens and partially effaces the ventral thecal sac. Mild spinal stenosis without frank cord impingement. Left greater than right uncovertebral spurring with resultant moderate bilateral C4 foraminal stenosis, slightly worse on the left. C4-C5: Mild disc bulge with uncovertebral hypertrophy. Superimposed tiny central disc protrusion indents the ventral thecal sac (series 15, image 12). No significant spinal stenosis. Mild to moderate bilateral C5 foraminal narrowing. C5-C6: Mild degenerative intervertebral disc space narrowing with diffuse disc osteophyte complex. Flattening and partial effacement of the ventral thecal sac with resultant mild spinal stenosis. Moderate right worse than left C6 foraminal narrowing. C6-C7: Degenerative intervertebral disc space narrowing with diffuse disc osteophyte complex. No significant spinal stenosis. Moderate bilateral C7 foraminal narrowing, slightly worse on the left. C7-T1: Negative interspace. Mild facet hypertrophy, worse on the left. No stenosis. IMPRESSION: 1. Patchy signal abnormality involving the cervical spinal cord extending from C2 through C7-T1, consistent with  history of chronic demyelinating disease. Overall, appearance is not significantly changed or progressed from prior. No enhancement to suggest active demyelination. 2. Multilevel cervical spondylosis with resultant mild spinal stenosis at C3-4 and C5-6. Moderate bilateral C4 through C7 foraminal narrowing as above. Electronically Signed   By: Jeannine Boga M.D.   On: 08/10/2021 02:05   MR THORACIC SPINE W WO CONTRAST  Result Date: 08/10/2021 CLINICAL DATA:  Initial evaluation for history of multiple sclerosis, acute flare. EXAM: MRI THORACIC WITHOUT AND WITH CONTRAST TECHNIQUE: Multiplanar and multiecho pulse sequences of the thoracic spine were obtained without and with intravenous contrast. CONTRAST:  8.6mL GADAVIST GADOBUTROL 1 MMOL/ML IV SOLN COMPARISON:  None available. FINDINGS: Alignment: Physiologic with preservation of the normal thoracic kyphosis. No listhesis. Vertebrae: Vertebral body height maintained without acute or chronic fracture. Bone marrow signal intensity within normal limits. No worrisome osseous lesions. No abnormal marrow edema or enhancement. Cord: Signal intensity within the thoracic spinal cord is grossly within normal limits, although evaluation mildly limited by motion. No convincing cord signal changes to suggest demyelinating disease. Normal cord caliber and morphology. No abnormal enhancement. Paraspinal and other soft tissues: Paraspinous soft tissues demonstrate no acute finding. Few small exophytic benign-appearing cyst partially visualized about the kidneys. Atelectatic changes seen dependently within the visualized lung bases. Disc levels: T11-12: Left paracentral disc protrusion with slight superior migration indents the ventral thecal sac (series 21, image 9). No significant spinal stenosis or cord impingement. Foramina remain patent. Otherwise, no other significant disc pathology seen elsewhere within the thoracic spine. No other stenosis or neural impingement.  Foramina remain patent. IMPRESSION: 1.  Normal MRI appearance of the thoracic spinal cord. No evidence for demyelinating disease. 2. Left paracentral disc protrusion at T11-12 without significant stenosis or neural impingement. Electronically Signed   By: Jeannine Boga M.D.   On: 08/10/2021 02:15   DG Chest Port 1 View  Result Date: 08/09/2021 CLINICAL DATA:  Shortness of breath. congestion. EXAM: PORTABLE CHEST 1 VIEW COMPARISON:  None. FINDINGS: 0743 hours. Low lung volumes. Patchy airspace disease noted medial right lung base, suspicious for pneumonia. Probable atelectasis left base. No edema or substantial pleural effusion. The cardiopericardial silhouette is within normal limits for size. The visualized bony structures of the thorax show no acute abnormality. IMPRESSION: Low volume film with patchy airspace opacity medial right lung base, suspicious for pneumonia. Follow-up imaging recommended to ensure resolution. Electronically Signed   By: Misty Stanley M.D.   On: 08/09/2021 07:57    Scheduled Meds:    vitamin C  500 mg Oral Daily   cholecalciferol  5,000 Units Oral Daily   Diroximel Fumarate  462 mg Oral BID   enoxaparin (LOVENOX) injection  40 mg Subcutaneous Q24H   Ipratropium-Albuterol  1 puff Inhalation Q6H   methylPREDNISolone (SOLU-MEDROL) injection  1 mg/kg Intravenous Q12H   Followed by   Derrill Memo ON 08/13/2021] predniSONE  50 mg Oral Daily   multivitamin with minerals  1 tablet Oral Daily   nirmatrelvir/ritonavir EUA  3 tablet Oral BID   omega-3 acid ethyl esters  1 g Oral Daily   zinc sulfate  220 mg Oral Daily    Continuous Infusions:    azithromycin 500 mg (08/10/21 0936)   cefTRIAXone (ROCEPHIN)  IV Stopped (08/10/21 0935)   lactated ringers     [START ON 08/11/2021] remdesivir 100 mg in NS 100 mL       LOS: 1 day     Vernell Leep, MD,  FACP, [facility], [facility], [facility] (Care Management Physician Certified) [place]  To  contact the attending provider between 7A-7P or the covering provider during after hours 7P-7A, please log into the web site [email or web address] and access using universal [place] password for that web site. If you do not have the password, please call the hospital operator.  08/10/2021, 9:42 AM

## 2021-08-10 NOTE — Evaluation (Signed)
Occupational Therapy Evaluation Patient Details Name: Alexander [name] MRN: [id number] DOB: 1975-11-26 Today's Date: 08/10/2021   History of Present Illness 46 year old male with medical history significant for multiple sclerosis, anxiety, HLD, presented to the ED on 08/09/2020 with complaints of fevers, chills, cough, followed by bilateral upper extremity and lower extremity weakness to a point where he could not ambulate.  Admitted for pneumonia, also positive for COVID-19 with associated weakness.  MRI brain, C and T-spine without acute MS flare.   Clinical Impression   Alexander [name] is a 46 year old man who presents with above medical history. Today he demonstrates generalized weakness, decreased activity tolerance, and impaired balance as well as chronic spasticity resulting in a decline in functional abilities. On evaluation he needed increased assistance to transfer out of stretcher and min assist for steadying during ambulation with RW. He requires max assist for LB ADLs and toileting and set up assist for UB ADLs. Patient will benefit from skilled OT services while in hospital to improve deficits and learn compensatory strategies as needed in order to return to PLOF.  Expect patient will recover if given enough time in hospital in order to discharge home. If however patient has not progressed to min guard / supervision he may need short term rehab - as he lives with his mother who does not have the physical abilities to transfer him. Patient is hopeful to return home.     Recommendations for follow up therapy are one component of a multi-disciplinary discharge planning process, led by the attending physician.  Recommendations may be updated based on patient status, additional functional criteria and insurance authorization.   Follow Up Recommendations  Home health OT vs SNF - pending progress.    Assistance Recommended at Discharge Frequent or constant Supervision/Assistance  Patient can  return home with the following A little help with walking and/or transfers;A little help with bathing/dressing/bathroom    Functional Status Assessment  Patient has had a recent decline in their functional status and demonstrates the ability to make significant improvements in function in a reasonable and predictable amount of time.  Equipment Recommendations  None recommended by OT    Recommendations for Other Services       Precautions / Restrictions Precautions Precautions: Fall Restrictions Weight Bearing Restrictions: No      Mobility Bed Mobility Overal bed mobility: Needs Assistance Bed Mobility: Supine to Sit;Sit to Supine     Supine to sit: Mod assist;HOB elevated Sit to supine: Min assist        Transfers Overall transfer level: Needs assistance Equipment used: Rolling walker (2 wheels) Transfers: Sit to/from Stand Sit to Stand: Min guard           General transfer comment: Min guard to stand with RW. Min assist for intermittent steadying assist to ambulate in room with RW.      Balance Overall balance assessment: Needs assistance Sitting-balance support: No upper extremity supported Sitting balance-Leahy Scale: Good     Standing balance support: Reliant on assistive device for balance Standing balance-Leahy Scale: Poor                             ADL either performed or assessed with clinical judgement   ADL Overall ADL's : Needs assistance/impaired Eating/Feeding: Independent   Grooming: Set up   Upper Body Bathing: Set up   Lower Body Bathing: Moderate assistance;Sit to/from stand   Upper Body Dressing : Set up;Sitting  Lower Body Dressing: Moderate assistance;Sit to/from stand   Toilet Transfer: Minimal assistance;BSC/3in1;Rolling walker (2 wheels)   Toileting- Clothing Manipulation and Hygiene: Maximal assistance;Sit to/from stand       Functional mobility during ADLs: Minimal assistance;Rolling walker (2 wheels)        Vision Patient Visual Report: No change from baseline       Perception     Praxis      Pertinent Vitals/Pain Pain Assessment: No/denies pain     Hand Dominance Right   Extremity/Trunk Assessment Upper Extremity Assessment Upper Extremity Assessment: RUE deficits/detail;LUE deficits/detail RUE Deficits / Details: WNL ROM, 5/5 strength throughout RUE Sensation: WNL RUE Coordination: WNL LUE Deficits / Details: WNL ROM, 5/5 shoulder, 4+/5 BICEP, 5/5 tricep, 4/5 wrist, 4/5 grip (spasticity in LUE) LUE Sensation: WNL LUE Coordination: decreased gross motor;decreased fine motor (spasticity effects coordination)   Lower Extremity Assessment Lower Extremity Assessment: Defer to PT evaluation   Cervical / Trunk Assessment Cervical / Trunk Assessment: Normal   Communication Communication Communication: No difficulties   Cognition Arousal/Alertness: Awake/alert Behavior During Therapy: WFL for tasks assessed/performed Overall Cognitive Status: Within Functional Limits for tasks assessed                                       General Comments       Exercises     Shoulder Instructions      Home Living Family/patient expects to be discharged to:: Private residence Living Arrangements: Parent Available Help at Discharge: Family Type of Home: House Home Access: Stairs to enter Technical brewer of Steps: 1   Home Layout: One level     Bathroom Shower/Tub: Occupational psychologist: Standard     Home Equipment: Grab bars - tub/shower;Shower seat;Rollator (4 wheels);Transport chair          Prior Functioning/Environment Prior Level of Function : Independent/Modified Independent             Mobility Comments: uses a rollator ADLs Comments: independent with ADLs        OT Problem List: Decreased activity tolerance;Impaired balance (sitting and/or standing);Decreased knowledge of use of DME or AE;Impaired UE functional  use;Impaired tone      OT Treatment/Interventions: Self-care/ADL training;Therapeutic exercise;Patient/family education;Balance training;Therapeutic activities;DME and/or AE instruction    OT Goals(Current goals can be found in the care plan section) Acute Rehab OT Goals Patient Stated Goal: to get strength back OT Goal Formulation: With patient Time For Goal Achievement: 08/24/21 Potential to Achieve Goals: Good  OT Frequency: Min 2X/week    Co-evaluation PT/OT/SLP Co-Evaluation/Treatment: Yes (co eval)            AM-PAC OT "6 Clicks" Daily Activity     Outcome Measure Help from another person eating meals?: None Help from another person taking care of personal grooming?: A Little Help from another person toileting, which includes using toliet, bedpan, or urinal?: A Lot Help from another person bathing (including washing, rinsing, drying)?: A Lot Help from another person to put on and taking off regular upper body clothing?: A Little Help from another person to put on and taking off regular lower body clothing?: A Lot 6 Click Score: 16   End of Session Equipment Utilized During Treatment: Rolling walker (2 wheels)  Activity Tolerance: Patient tolerated treatment well Patient left: in bed;with call bell/phone within reach  OT Visit Diagnosis: Unsteadiness on feet (R26.81);Muscle weakness (generalized) (  M62.81)                Time: 1511-1540 OT Time Calculation (min): 29 min Charges:  OT General Charges $OT Visit: 1 Visit OT Evaluation $OT Eval Low Complexity: 1 Low  Tessia Kassin, OTR/L [place]  Office [phone] Pager: [place] 08/10/2021, 4:20 PM

## 2021-08-10 NOTE — Plan of Care (Signed)
New admission 1501, COVID-19.  Pt aox4, pleasant and cooperative with staff.  IV abx/antivirals per orders.  PT/OT consulted for weakness, pt with hx of MS.  Telemetry initiated.   Problem: Education: Goal: Knowledge of General Education information will improve Description: Including pain rating scale, medication(s)/side effects and non-pharmacologic comfort measures Outcome: Progressing   Problem: Health Behavior/Discharge Planning: Goal: Ability to manage health-related needs will improve Outcome: Progressing   Problem: Clinical Measurements: Goal: Ability to maintain clinical measurements within normal limits will improve Outcome: Progressing Goal: Will remain free from infection Outcome: Progressing Goal: Diagnostic test results will improve Outcome: Progressing Goal: Respiratory complications will improve Outcome: Progressing Goal: Cardiovascular complication will be avoided Outcome: Progressing   Problem: Activity: Goal: Risk for activity intolerance will decrease Outcome: Progressing   Problem: Nutrition: Goal: Adequate nutrition will be maintained Outcome: Progressing   Problem: Coping: Goal: Level of anxiety will decrease Outcome: Progressing   Problem: Elimination: Goal: Will not experience complications related to bowel motility Outcome: Progressing Goal: Will not experience complications related to urinary retention Outcome: Progressing   Problem: Pain Managment: Goal: General experience of comfort will improve Outcome: Progressing   Problem: Safety: Goal: Ability to remain free from injury will improve Outcome: Progressing   Problem: Skin Integrity: Goal: Risk for impaired skin integrity will decrease Outcome: Progressing   Problem: Education: Goal: Knowledge of risk factors and measures for prevention of condition will improve Outcome: Progressing   Problem: Coping: Goal: Psychosocial and spiritual needs will be supported Outcome:  Progressing   Problem: Respiratory: Goal: Will maintain a patent airway Outcome: Progressing Goal: Complications related to the disease process, condition or treatment will be avoided or minimized Outcome: Progressing

## 2021-08-11 LAB — CBC WITH DIFFERENTIAL/PLATELET
Abs Immature Granulocytes: 0.04 10*3/uL (ref 0.00–0.07)
Basophils Absolute: 0 10*3/uL (ref 0.0–0.1)
Basophils Relative: 0 %
Eosinophils Absolute: 0 10*3/uL (ref 0.0–0.5)
Eosinophils Relative: 0 %
HCT: 41.3 % (ref 39.0–52.0)
Hemoglobin: 13.8 g/dL (ref 13.0–17.0)
Immature Granulocytes: 1 %
Lymphocytes Relative: 4 %
Lymphs Abs: 0.3 10*3/uL — ABNORMAL LOW (ref 0.7–4.0)
MCH: 28.7 pg (ref 26.0–34.0)
MCHC: 33.4 g/dL (ref 30.0–36.0)
MCV: 85.9 fL (ref 80.0–100.0)
Monocytes Absolute: 0.5 10*3/uL (ref 0.1–1.0)
Monocytes Relative: 6 %
Neutro Abs: 7.3 10*3/uL (ref 1.7–7.7)
Neutrophils Relative %: 89 %
Platelets: 167 10*3/uL (ref 150–400)
RBC: 4.81 MIL/uL (ref 4.22–5.81)
RDW: 13 % (ref 11.5–15.5)
WBC: 8.1 10*3/uL (ref 4.0–10.5)
nRBC: 0 % (ref 0.0–0.2)

## 2021-08-11 LAB — COMPREHENSIVE METABOLIC PANEL
ALT: 30 U/L (ref 0–44)
AST: 19 U/L (ref 15–41)
Albumin: 3.3 g/dL — ABNORMAL LOW (ref 3.5–5.0)
Alkaline Phosphatase: 33 U/L — ABNORMAL LOW (ref 38–126)
Anion gap: 9 (ref 5–15)
BUN: 19 mg/dL (ref 6–20)
CO2: 26 mmol/L (ref 22–32)
Calcium: 8.8 mg/dL — ABNORMAL LOW (ref 8.9–10.3)
Chloride: 104 mmol/L (ref 98–111)
Creatinine, Ser: 0.71 mg/dL (ref 0.61–1.24)
GFR, Estimated: >60 mL/min (ref >60)
Glucose, Bld: 155 mg/dL — ABNORMAL HIGH (ref 70–99)
Potassium: 4.1 mmol/L (ref 3.5–5.1)
Sodium: 139 mmol/L (ref 135–145)
Total Bilirubin: 0.7 mg/dL (ref 0.3–1.2)
Total Protein: 6.4 g/dL — ABNORMAL LOW (ref 6.5–8.1)

## 2021-08-11 LAB — C-REACTIVE PROTEIN: CRP: 9.8 mg/dL — ABNORMAL HIGH

## 2021-08-11 LAB — FERRITIN: Ferritin: 258 ng/mL (ref 24–336)

## 2021-08-11 LAB — D-DIMER, QUANTITATIVE: D-Dimer, Quant: 0.33 ug{FEU}/mL (ref 0.00–0.50)

## 2021-08-11 MED ORDER — AZITHROMYCIN 250 MG PO TABS
500.0000 mg | ORAL_TABLET | Freq: Every day | ORAL | Status: DC
  Administered 2021-08-11 – 2021-08-12 (×2): 500 mg via ORAL
  Filled 2021-08-11 (×2): qty 2

## 2021-08-11 MED ORDER — ENOXAPARIN SODIUM 40 MG/0.4ML IJ SOSY
40.0000 mg | PREFILLED_SYRINGE | INTRAMUSCULAR | Status: DC
  Administered 2021-08-12: 40 mg via SUBCUTANEOUS
  Filled 2021-08-11: qty 0.4

## 2021-08-11 NOTE — Progress Notes (Signed)
PHARMACIST - PHYSICIAN COMMUNICATION  CONCERNING: Antibiotic IV to Oral Route Change Policy  RECOMMENDATION: This patient is receiving azithromycin by the intravenous route.  Based on criteria approved by the Pharmacy and Therapeutics Committee, the antibiotic(s) is/are being converted to the equivalent oral dose form(s).   DESCRIPTION: These criteria include:  Patient being treated for a respiratory tract infection, urinary tract infection, cellulitis or clostridium difficile associated diarrhea if on metronidazole  The patient is not neutropenic and does not exhibit a GI malabsorption state  The patient is eating (either orally or via tube) and/or has been taking other orally administered medications for a least 24 hours  The patient is improving clinically and has a Tmax < 100.5  If you have questions about this conversion, please contact the Pharmacy Department  []  ( [phone] )  [place] []  ( [phone] )  [facility] []  ( [phone] )  [place] []  ( [phone] )  [facility] [x]  ( [phone] )  [facility]  

## 2021-08-11 NOTE — Progress Notes (Signed)
Physical Therapy Treatment Patient Details Name: Alexander [name] MRN: [id number] DOB: 1976/04/09 Today's Date: 08/11/2021   History of Present Illness Pt is 46 year old male presented to the ED on 08/09/2020 with complaints of fevers, chills, cough, followed by bilateral upper extremity and lower extremity weakness to a point where he could not ambulate.  Admitted for pneumonia, also positive for COVID-19 with associated weakness.  MRI brain, C and T-spine without acute MS flare.  Pt with medical history significant for multiple sclerosis, anxiety, HLD,    PT Comments    Pt making good progress today.  Pt ambulating in room 120' with min guard.  Pt requiring min cues and mod A for bed mobility. Continue plan of care.   Recommendations for follow up therapy are one component of a multi-disciplinary discharge planning process, led by the attending physician.  Recommendations may be updated based on patient status, additional functional criteria and insurance authorization.  Follow Up Recommendations  Home health PT     Assistance Recommended at Discharge Intermittent Supervision/Assistance  Patient can return home with the following A little help with walking and/or transfers;A little help with bathing/dressing/bathroom   Equipment Recommendations  None recommended by PT    Recommendations for Other Services       Precautions / Restrictions Precautions Precautions: Fall     Mobility  Bed Mobility Overal bed mobility: Needs Assistance Bed Mobility: Supine to Sit;Sit to Supine     Supine to sit: Mod assist;HOB elevated Sit to supine: Min assist   General bed mobility comments: Pt self assisting legs off bed but then requiring mod A to lift trunk.  For transfer back to bed, pt started self assisting leg buts required min A.  Educated on leg lifters if needed that could be kept bedside at home    Transfers Overall transfer level: Needs assistance Equipment used: Rolling walker (2  wheels) Transfers: Sit to/from Stand Sit to Stand: Min guard           General transfer comment: Performed x 2; Min guard for safety    Ambulation/Gait Ambulation/Gait assistance: Min guard Gait Distance (Feet): 120 Feet Assistive device: Rolling walker (2 wheels) Gait Pattern/deviations: Step-through pattern;Decreased stride length;Wide base of support Gait velocity: decreased     General Gait Details: Pt tends to walk with stiff legs/decreased knee flex/mild circumduction.  Reports he has no hamstring strength baseline from MS.  Pt also, reports that if he falls he falls to right at baseline.  Reports R leg is weak and L leg has spasticity.  Discussed likely pushing with L leg toward R weak causing falls.  Discussed use RW and making sure to put weight into hands during R stance.  Pt reports typically uses rollator at home but plans to use RW initially upon return - PT agrees   Social research officer, government [employer] (Stroke Patients Only)       Balance Overall balance assessment: Needs assistance Sitting-balance support: No upper extremity supported Sitting balance-Leahy Scale: Good     Standing balance support: Reliant on assistive device for balance;Bilateral upper extremity supported Standing balance-Leahy Scale: Poor                              Cognition Arousal/Alertness: Awake/alert Behavior During Therapy: WFL for tasks assessed/performed Overall Cognitive Status: Within Functional Limits for tasks assessed  Exercises General Exercises - Lower Extremity Ankle Circles/Pumps: AROM;Both;10 reps;Seated (assist on L initially to lessen inversion) Long Arc Quad: AROM;10 reps;Both;Seated Heel Slides: AAROM;10 reps;Both;Supine Hip ABduction/ADduction: AAROM;Both;10 reps;Supine Hip Flexion/Marching: AROM;Both;10 reps;Seated    General Comments General comments (skin  integrity, edema, etc.): VSS      Pertinent Vitals/Pain Pain Assessment: No/denies pain    Home Living                          Prior Function            PT Goals (current goals can now be found in the care plan section) Progress towards PT goals: Progressing toward goals    Frequency    Min 4X/week      PT Plan Current plan remains appropriate    Co-evaluation              AM-PAC PT "6 Clicks" Mobility   Outcome Measure  Help needed turning from your back to your side while in a flat bed without using bedrails?: A Little Help needed moving from lying on your back to sitting on the side of a flat bed without using bedrails?: A Little Help needed moving to and from a bed to a chair (including a wheelchair)?: A Little Help needed standing up from a chair using your arms (e.g., wheelchair or bedside chair)?: A Little Help needed to walk in hospital room?: A Little Help needed climbing 3-5 steps with a railing? : A Little 6 Click Score: 18    End of Session Equipment Utilized During Treatment: Gait belt Activity Tolerance: Patient tolerated treatment well Patient left: in bed;with call bell/phone within reach;with bed alarm set Nurse Communication: Mobility status PT Visit Diagnosis: Unsteadiness on feet (R26.81);Muscle weakness (generalized) (M62.81)     Time: 0177-9390 PT Time Calculation (min) (ACUTE ONLY): 29 min  Charges:  $Gait Training: 8-22 mins $Therapeutic Exercise: 8-22 mins                     Anise Salvo, PT Acute Rehab Services Pager [phone] [facility] [phone]    Rayetta Humphrey 08/11/2021, 4:37 PM

## 2021-08-11 NOTE — Progress Notes (Signed)
PROGRESS NOTE    Alexander [name]  ZLD:[id number] DOB: 11-Sep-1975 DOA: 08/09/2021 PCP: Nelwyn Salisbury, MD     Brief Narrative:  46 year old male with medical history significant for multiple sclerosis, anxiety, HLD, presented to the ED on 08/09/2020 with complaints of fevers, chills, cough, followed by bilateral upper extremity and lower extremity weakness to a point where he could not ambulate.  Admitted for pneumonia, also positive for COVID-19 with associated weakness.  MRI brain, C and T-spine without acute MS flare.   New events last 24 hours / Subjective: Slight SOB, still having some weakness. Improving overall. Will plan to return home where his mom is, she tested + for covid also.  He is hopeful to go home tomorrow.  Assessment & Plan:   Principal Problem:   COVID-19  Remdesivir 100mg /d on day 2/5  Combivent bid  Rocephin and azithromycin to cover for bacterial superinfection, day 3/5  Active Problems:   Multiple sclerosis (HCC)  Diroximel Fumarate 462 mg bid  Baclofen 10 mg TID prn  Appreciate PT  MRI neg for suggestion of MS flare  DVT prophylaxis: Lovenox Code Status: Full Family Communication: Self Coming From: Home Disposition Plan: Home Barriers to Discharge: Medical improvement  Consultants:  Neurology  Procedures:  None  Antimicrobials:  Anti-infectives (From admission, onward)    Start     Dose/Rate Route Frequency Ordered Stop   08/11/21 1145  azithromycin (ZITHROMAX) tablet 500 mg        500 mg Oral Daily 08/11/21 1055 08/14/21 0959   08/11/21 1000  remdesivir 100 mg in sodium chloride 0.9 % 100 mL IVPB  Status:  Discontinued       See Hyperspace for full Linked Orders Report.   100 mg 200 mL/hr over 30 Minutes Intravenous Daily 08/10/21 0314 08/10/21 0317   08/11/21 1000  remdesivir 100 mg in sodium chloride 0.9 % 100 mL IVPB       See Hyperspace for full Linked Orders Report.   100 mg 200 mL/hr over 30 Minutes Intravenous Daily 08/10/21 0317  08/15/21 0959   08/10/21 0347  remdesivir 100 mg in sodium chloride 0.9 % 100 mL IVPB       See Hyperspace for full Linked Orders Report.   100 mg 200 mL/hr over 30 Minutes Intravenous  Once 08/10/21 0317 08/10/21 0430   08/10/21 0330  remdesivir 100 mg in sodium chloride 0.9 % 100 mL IVPB       See Hyperspace for full Linked Orders Report.   100 mg 200 mL/hr over 30 Minutes Intravenous  Once 08/10/21 0317 08/10/21 0400   08/10/21 0315  remdesivir 200 mg in sodium chloride 0.9% 250 mL IVPB  Status:  Discontinued       See Hyperspace for full Linked Orders Report.   200 mg 580 mL/hr over 30 Minutes Intravenous Once 08/10/21 0314 08/10/21 0317   08/09/21 1400  nirmatrelvir/ritonavir EUA (PAXLOVID) 3 tablet  Status:  Discontinued        3 tablet Oral 2 times daily 08/09/21 1351 08/10/21 1128   08/09/21 0915  azithromycin (ZITHROMAX) 500 mg in sodium chloride 0.9 % 250 mL IVPB  Status:  Discontinued        500 mg 250 mL/hr over 60 Minutes Intravenous Every 24 hours 08/09/21 0905 08/11/21 1055   08/09/21 0915  cefTRIAXone (ROCEPHIN) 1 g in sodium chloride 0.9 % 100 mL IVPB        1 g 200 mL/hr over 30 Minutes Intravenous Every 24  hours 08/09/21 0905          Objective: Vitals:   08/10/21 2019 08/10/21 2122 08/11/21 0047 08/11/21 0501  BP: (!) 145/73  127/83 115/77  Pulse: 81  83 78  Resp: Temp: 98.2 F (36.8 C)  97.8 F (36.6 C) 98.2 F (36.8 C)  TempSrc: Oral  Oral Oral  SpO2: 99% 95% 95% 95%  Weight:      Height:        Intake/Output Summary (Last 24 hours) at 08/11/2021 1231 Last data filed at 08/11/2021 1006 Gross per 24 hour  Intake 1189.43 ml  Output 500 ml  Net 689.43 ml   Filed Weights   08/09/21 0701  Weight: 77.1 kg    Examination:  General exam: Appears calm and comfortable  Respiratory system: Clear to auscultation. Respiratory effort normal. No respiratory distress. No conversational dyspnea.  Cardiovascular system: S1 & S2 heard, RRR. No  murmurs. No pedal edema. Gastrointestinal system: Abdomen is nondistended, soft and nontender. Normal bowel sounds heard. Central nervous system: Alert and oriented. Speech clear.  Extremities: Symmetric in appearance  Skin: No rashes, lesions or ulcers on exposed skin  Psychiatry: Judgement and insight appear normal. Mood & affect appropriate.   Data Reviewed: I have personally reviewed following labs and imaging studies  CBC: Recent Labs  Lab 08/09/21 0800 08/10/21 0328 08/11/21 0542  WBC 9.0 7.6 8.1  NEUTROABS 7.6 6.9 7.3  HGB 16.2 13.5 13.8  HCT 50.1 39.1 41.3  MCV 88.0 84.4 85.9  PLT 142* 143* 167   Basic Metabolic Panel: Recent Labs  Lab 08/09/21 0800 08/10/21 0328 08/11/21 0542  NA 136 138 139  K 4.1 4.0 4.1  CL 103 104 104  CO2 21* 26 26  GLUCOSE 126* 148* 155*  BUN CREATININE 0.81 0.83 0.71  CALCIUM 8.7* 8.8* 8.8*   GFR: Estimated Creatinine Clearance: 110.4 mL/min (by C-G formula based on SCr of 0.71 mg/dL).  Liver Function Tests: Recent Labs  Lab 08/09/21 0800 08/10/21 0328 08/11/21 0542  AST ALT 28 37 30  ALKPHOS 44 35* 33*  BILITOT 1.2 1.3* 0.7  PROT 7.4 6.2* 6.4*  ALBUMIN 4.3 3.3* 3.3*   Anemia Panel: Recent Labs    08/10/21 0327 08/11/21 0542  FERRITIN 169 258   Sepsis Labs: Recent Labs  Lab 08/09/21 0800 08/10/21 0328  PROCALCITON  --  1.16  LATICACIDVEN 2.0*  --     Recent Results (from the past 240 hour(s))  Culture, blood (Routine X 2) w Reflex to ID Panel     Status: None (Preliminary result)   Collection Time: 08/09/21  8:00 AM   Specimen: BLOOD RIGHT WRIST  Result Value Ref Range Status   Specimen Description   Final    BLOOD RIGHT WRIST Performed at [facility], [address], [place], [place] [address]    Special Requests   Final    BOTTLES DRAWN AEROBIC AND ANAEROBIC Blood Culture results may not be optimal due to an inadequate volume of blood received in culture  bottles Performed at [facility], [address], [place], [place] [address]    Culture   Final    NO GROWTH 2 DAYS Performed at [facility], [address] [place], [place] [address]    Report Status PENDING  Incomplete  Resp Panel by RT-PCR (Flu A&B, Covid) Peripheral     Status: Abnormal  Collection Time: 08/09/21  8:00 AM   Specimen: Peripheral; Nasopharyngeal(NP) swabs in vial transport medium  Result Value Ref Range Status   SARS Coronavirus 2 by RT PCR POSITIVE (A) NEGATIVE Final    Comment: (NOTE) SARS-CoV-2 target nucleic acids are DETECTED.  The SARS-CoV-2 RNA is generally detectable in upper respiratory specimens during the acute phase of infection. Positive results are indicative of the presence of the identified virus, but do not rule out bacterial infection or co-infection with other pathogens not detected by the test. Clinical correlation with patient history and other diagnostic information is necessary to determine patient infection status. The expected result is Negative.  Fact Sheet for Patients: BloggerCourse.com  Fact Sheet for Healthcare Providers: SeriousBroker.it  This test is not yet approved or cleared by the [place] FDA and  has been authorized for detection and/or diagnosis of SARS-CoV-2 by FDA under an Emergency Use Authorization (EUA).  This EUA will remain in effect (meaning this test can be used) for the duration of  the COVID-19 declaration under Section 564(b)(1) of the A ct, 21 U.S.C. section 360bbb-3(b)(1), unless the authorization is terminated or revoked sooner.     Influenza A by PCR NEGATIVE NEGATIVE Final   Influenza B by PCR NEGATIVE NEGATIVE Final    Comment: (NOTE) The Xpert Xpress SARS-CoV-2/FLU/RSV plus assay is intended as an aid in the diagnosis of influenza from Nasopharyngeal swab specimens and should not be used as a sole basis for  treatment. Nasal washings and aspirates are unacceptable for Xpert Xpress SARS-CoV-2/FLU/RSV testing.  Fact Sheet for Patients: BloggerCourse.com  Fact Sheet for Healthcare Providers: SeriousBroker.it  This test is not yet approved or cleared by the [place] FDA and has been authorized for detection and/or diagnosis of SARS-CoV-2 by FDA under an Emergency Use Authorization (EUA). This EUA will remain in effect (meaning this test can be used) for the duration of the COVID-19 declaration under Section 564(b)(1) of the Act, 21 U.S.C. section 360bbb-3(b)(1), unless the authorization is terminated or revoked.  Performed at [facility], [address], [place], [place] [address]   Culture, blood (Routine X 2) w Reflex to ID Panel     Status: None (Preliminary result)   Collection Time: 08/10/21 12:52 AM   Specimen: Left Antecubital; Blood  Result Value Ref Range Status   Specimen Description   Final    LEFT ANTECUBITAL Performed at [facility], [address], [place], [place] [address]    Special Requests   Final    BOTTLES DRAWN AEROBIC AND ANAEROBIC Blood Culture results may not be optimal due to an inadequate volume of blood received in culture bottles Performed at [facility], [address], [place], [place] [address]    Culture   Final    NO GROWTH 1 DAY Performed at [facility], [address] [place], [place] [address]    Report Status PENDING  Incomplete      Radiology Studies: MR BRAIN W WO CONTRAST  Result Date: 08/10/2021 CLINICAL DATA:  Initial evaluation for multiple sclerosis. EXAM: MRI HEAD WITHOUT AND WITH CONTRAST TECHNIQUE: Multiplanar, multiecho pulse sequences of the brain and surrounding structures were obtained without and with intravenous contrast. CONTRAST:  8.13mL GADAVIST GADOBUTROL 1 MMOL/ML IV SOLN COMPARISON:  MRI from 09/21/2019.  FINDINGS: Brain: Examination somewhat technically limited by motion artifact. Additionally, an axial DWI sequence is not available for review at time of this dictation. Mildly advanced cerebral atrophy for age,  stable. Again seen are multiple scattered T2/FLAIR hyperintense foci involving the brainstem, with involvement of the medulla, pons, and midbrain. Patchy involvement of the cerebellum with involvement of the left middle cerebellar peduncle. Few scattered foci seen involving the periventricular and deep white matter of both cerebral hemispheres. Findings are presumably related to history of chronic demyelinating disease. No abnormal enhancement to suggest active demyelination. In comparison with previous exam, there appears to be at least 1 new focus involving the central midbrain w (series 17, image 19), not definitely seen on prior study, suggesting minimal disease progression. No associated enhancement or restricted diffusion to suggest active demyelination. No evidence for acute or subacute infarct. Gray-white matter differentiation otherwise maintained. No areas of chronic cortical infarction. No evidence for acute or chronic intracranial hemorrhage. No mass lesion, mass effect or midline shift. No hydrocephalus or extra-axial fluid collection. Pituitary gland suprasellar region normal. Midline structures intact and normal. Vascular: Major intracranial vascular flow voids are maintained. Skull and upper cervical spine: Craniocervical junction within normal limits. Partially visualized upper cervical spinal cord unremarkable. Bone marrow signal intensity within normal limits. No scalp soft tissue abnormality. Sinuses/Orbits: Globes and orbital soft tissues within normal limits. Mild scattered mucosal thickening noted within the ethmoidal air cells. Paranasal sinuses are otherwise clear. No significant mastoid effusion. Inner ear structures grossly normal. Other: None. IMPRESSION: 1. Few scattered T2/FLAIR  hyperintense foci involving the supratentorial and infratentorial cerebral white matter, consistent with history of chronic demyelinating disease. In comparison with previous exam, there appears to be at least 1 new focus involving the central midbrain, not definitely seen on prior study, suggesting minimal disease progression. No associated enhancement or restricted diffusion to suggest active demyelination. 2. Underlying mildly advanced cerebral atrophy for age, stable. 3. No other acute intracranial abnormality. Electronically Signed   By: Rise Mu M.D.   On: 08/10/2021 01:47   MR CERVICAL SPINE W WO CONTRAST  Result Date: 08/10/2021 CLINICAL DATA:  Initial evaluation for multiple sclerosis, MS flare. EXAM: MRI CERVICAL SPINE WITHOUT AND WITH CONTRAST TECHNIQUE: Multiplanar and multiecho pulse sequences of the cervical spine, to include the craniocervical junction and cervicothoracic junction, were obtained without and with intravenous contrast. CONTRAST:  8.2mL GADAVIST GADOBUTROL 1 MMOL/ML IV SOLN COMPARISON:  Comparison made with previous MRI from 09/21/2019. FINDINGS: Alignment: Physiologic with preservation of the normal cervical lordosis. Trace retrolisthesis of C3 on C4, stable. Vertebrae: Vertebral body height maintained without acute or interval fracture. Bone marrow signal intensity within normal limits. No discrete or worrisome osseous lesions. No abnormal marrow edema. Cord: Evaluation of the cervical spinal cord is mildly limited by motion artifact on this exam. Patchy signal abnormality again noted involving the visualized medulla and brainstem. Within the cord itself, patchy and hazy signal abnormality seen extending from C2 through approximately C7-T1, relatively similar in appearance as compared to previous. No definite new lesions or evidence for significant disease progression. No associated enhancement to suggest active demyelination. Mild cord atrophy at the level of C4 through  C6 noted, similar. Posterior Fossa, vertebral arteries, paraspinal tissues: Craniocervical junction within normal limits. Paraspinous soft tissues normal. Normal flow voids seen within the vertebral arteries bilaterally. Disc levels: C2-C3: Small central disc protrusion indents the ventral thecal sac. No significant spinal stenosis or cord deformity. Foramina remain patent. C3-C4: Broad-based posterior disc osteophyte complex flattens and partially effaces the ventral thecal sac. Mild spinal stenosis without frank cord impingement. Left greater than right uncovertebral spurring with resultant moderate bilateral C4 foraminal stenosis, slightly worse  on the left. C4-C5: Mild disc bulge with uncovertebral hypertrophy. Superimposed tiny central disc protrusion indents the ventral thecal sac (series 15, image 12). No significant spinal stenosis. Mild to moderate bilateral C5 foraminal narrowing. C5-C6: Mild degenerative intervertebral disc space narrowing with diffuse disc osteophyte complex. Flattening and partial effacement of the ventral thecal sac with resultant mild spinal stenosis. Moderate right worse than left C6 foraminal narrowing. C6-C7: Degenerative intervertebral disc space narrowing with diffuse disc osteophyte complex. No significant spinal stenosis. Moderate bilateral C7 foraminal narrowing, slightly worse on the left. C7-T1: Negative interspace. Mild facet hypertrophy, worse on the left. No stenosis. IMPRESSION: 1. Patchy signal abnormality involving the cervical spinal cord extending from C2 through C7-T1, consistent with history of chronic demyelinating disease. Overall, appearance is not significantly changed or progressed from prior. No enhancement to suggest active demyelination. 2. Multilevel cervical spondylosis with resultant mild spinal stenosis at C3-4 and C5-6. Moderate bilateral C4 through C7 foraminal narrowing as above. Electronically Signed   By: Rise Mu M.D.   On: 08/10/2021  02:05   MR THORACIC SPINE W WO CONTRAST  Result Date: 08/10/2021 CLINICAL DATA:  Initial evaluation for history of multiple sclerosis, acute flare. EXAM: MRI THORACIC WITHOUT AND WITH CONTRAST TECHNIQUE: Multiplanar and multiecho pulse sequences of the thoracic spine were obtained without and with intravenous contrast. CONTRAST:  8.47mL GADAVIST GADOBUTROL 1 MMOL/ML IV SOLN COMPARISON:  None available. FINDINGS: Alignment: Physiologic with preservation of the normal thoracic kyphosis. No listhesis. Vertebrae: Vertebral body height maintained without acute or chronic fracture. Bone marrow signal intensity within normal limits. No worrisome osseous lesions. No abnormal marrow edema or enhancement. Cord: Signal intensity within the thoracic spinal cord is grossly within normal limits, although evaluation mildly limited by motion. No convincing cord signal changes to suggest demyelinating disease. Normal cord caliber and morphology. No abnormal enhancement. Paraspinal and other soft tissues: Paraspinous soft tissues demonstrate no acute finding. Few small exophytic benign-appearing cyst partially visualized about the kidneys. Atelectatic changes seen dependently within the visualized lung bases. Disc levels: T11-12: Left paracentral disc protrusion with slight superior migration indents the ventral thecal sac (series 21, image 9). No significant spinal stenosis or cord impingement. Foramina remain patent. Otherwise, no other significant disc pathology seen elsewhere within the thoracic spine. No other stenosis or neural impingement. Foramina remain patent. IMPRESSION: 1. Normal MRI appearance of the thoracic spinal cord. No evidence for demyelinating disease. 2. Left paracentral disc protrusion at T11-12 without significant stenosis or neural impingement. Electronically Signed   By: Rise Mu M.D.   On: 08/10/2021 02:15     Scheduled Meds:  vitamin C  500 mg Oral Daily   azithromycin  500 mg Oral Daily    cholecalciferol  5,000 Units Oral Daily   Diroximel Fumarate  462 mg Oral BID   enoxaparin (LOVENOX) injection  40 mg Subcutaneous Q24H   Ipratropium-Albuterol  1 puff Inhalation BID   multivitamin with minerals  1 tablet Oral Daily   omega-3 acid ethyl esters  1 g Oral Daily   zinc sulfate  220 mg Oral Daily   Continuous Infusions:  cefTRIAXone (ROCEPHIN)  IV Stopped (08/11/21 1039)   remdesivir 100 mg in NS 100 mL 100 mg (08/11/21 1040)     LOS: 2 days    Time spent: 30 minutes   Sharlene Dory, DO [facility] 08/11/2021, 12:31 PM   Available via Epic secure chat 7am-7pm After these hours, please refer to coverage provider listed on [email or web address]

## 2021-08-12 LAB — CBC WITH DIFFERENTIAL/PLATELET
Abs Immature Granulocytes: 0.06 10*3/uL (ref 0.00–0.07)
Basophils Absolute: 0 10*3/uL (ref 0.0–0.1)
Basophils Relative: 0 %
Eosinophils Absolute: 0 10*3/uL (ref 0.0–0.5)
Eosinophils Relative: 0 %
HCT: 44.5 % (ref 39.0–52.0)
Hemoglobin: 14.8 g/dL (ref 13.0–17.0)
Immature Granulocytes: 1 %
Lymphocytes Relative: 5 %
Lymphs Abs: 0.4 10*3/uL — ABNORMAL LOW (ref 0.7–4.0)
MCH: 28.8 pg (ref 26.0–34.0)
MCHC: 33.3 g/dL (ref 30.0–36.0)
MCV: 86.7 fL (ref 80.0–100.0)
Monocytes Absolute: 0.4 10*3/uL (ref 0.1–1.0)
Monocytes Relative: 5 %
Neutro Abs: 7.2 10*3/uL (ref 1.7–7.7)
Neutrophils Relative %: 89 %
Platelets: 181 10*3/uL (ref 150–400)
RBC: 5.13 MIL/uL (ref 4.22–5.81)
RDW: 12.9 % (ref 11.5–15.5)
WBC: 8.2 10*3/uL (ref 4.0–10.5)
nRBC: 0 % (ref 0.0–0.2)

## 2021-08-12 LAB — COMPREHENSIVE METABOLIC PANEL
ALT: 27 U/L (ref 0–44)
AST: 16 U/L (ref 15–41)
Albumin: 3.6 g/dL (ref 3.5–5.0)
Alkaline Phosphatase: 36 U/L — ABNORMAL LOW (ref 38–126)
Anion gap: 9 (ref 5–15)
BUN: 17 mg/dL (ref 6–20)
CO2: 26 mmol/L (ref 22–32)
Calcium: 8.9 mg/dL (ref 8.9–10.3)
Chloride: 102 mmol/L (ref 98–111)
Creatinine, Ser: 0.88 mg/dL (ref 0.61–1.24)
GFR, Estimated: >60 mL/min (ref >60)
Glucose, Bld: 174 mg/dL — ABNORMAL HIGH (ref 70–99)
Potassium: 4.1 mmol/L (ref 3.5–5.1)
Sodium: 137 mmol/L (ref 135–145)
Total Bilirubin: 0.7 mg/dL (ref 0.3–1.2)
Total Protein: 6.7 g/dL (ref 6.5–8.1)

## 2021-08-12 MED ORDER — NIRMATRELVIR/RITONAVIR (PAXLOVID)TABLET: 3.0000 | ORAL_TABLET | Freq: Two times a day (BID) | ORAL | 0 refills | Status: AC

## 2021-08-12 MED ORDER — AZITHROMYCIN 250 MG PO TABS: ORAL_TABLET | ORAL | 0 refills | Status: DC

## 2021-08-12 MED ORDER — CEPHALEXIN 500 MG PO CAPS: 500.0000 mg | ORAL_CAPSULE | Freq: Three times a day (TID) | ORAL | 0 refills | Status: AC

## 2021-08-12 NOTE — Discharge Instructions (Signed)

## 2021-08-12 NOTE — Discharge Summary (Signed)
Physician Discharge Summary  [name] ZOX:[id number] DOB: Jan 03, 1976 DOA: 08/09/2021  PCP: Alexander Salisbury, MD  Admit date: 08/09/2021 Discharge date: 08/12/2021  Admitted From: Home Disposition:  Home  Recommendations for Outpatient Follow-up:  Follow up with PCP in 1 week Please obtain CBC in 1 week to ensure resolution of thrombocytopenia.  Please follow up on the following pending results: Blood cultures w NGTD at time of dc.  Home Health: PT  Equipment/Devices: none   Discharge Condition: Good CODE STATUS: Full  Diet recommendation: General  Brief/Interim Summary: From H&P: From Dr. Margie [name]: "HPI: Alexander [name] is a 46 y.o. male with medical history significant of MS, anxiety,HLD. Presenting with BLE/BUE weakness. He reports he was in his normal state of health until yesterday. Yesterday morning, he began having fevers and chills. As the day progressed, he noticed that he legs became weak and he couldn't walk. He next noticed increased cough and weakness in his upper extremities. As his symptoms worsened, he became concerned and came to the ED for evaluation. He denies any other aggravating or alleviating factors.    ED Course: CXR shows RLL PNA. He was positive for COVID. He was started on CAP coverage. TRH was called for admission."  Interim: MRI of T/C spine and brain neg for signal flare. On Remdesivir improving. Weakness steadily improved with PT intervention.   Subjective on day of discharge: pt's strength improving, still having some residual issues. Ready to go home. Breathing well, no coughing. Pleased w care.   Discharge Diagnoses:  Principal Problem:   COVID-19 Active Problems:   Multiple sclerosis (HCC)  3 days of Paxlovid and Keflex starting on 1/9 with final dose being evening of 08/16/11.  1 dose of Azithromycin 500 mg on 1/9.  Home Health PT to help him get back to baseline w strength and balance.   Discharge Instructions  Discharge Instructions      Call MD for:  difficulty breathing, headache or visual disturbances   Complete by: As directed    Diet - low sodium heart healthy   Complete by: As directed    Increase activity slowly   Complete by: As directed       Allergies as of 08/12/2021   No Known Allergies      Medication List     STOP taking these medications    atorvastatin 20 MG tablet Commonly known as: LIPITOR       TAKE these medications    azithromycin 250 MG tablet Commonly known as: ZITHROMAX Take 1 tab on 1/9. Start taking on: August 13, 2021   baclofen 10 MG tablet Commonly known as: LIORESAL Take up to three times daily as needed What changed:  how much to take how to take this when to take this reasons to take this additional instructions   cephALEXin 500 MG capsule Commonly known as: KEFLEX Take 1 capsule (500 mg total) by mouth 3 (three) times daily for 3 days. Start taking on: August 13, 2021   Dialyvite Vitamin D 5000 125 MCG (5000 UT) capsule Generic drug: Cholecalciferol Take 5,000 Units by mouth daily.   escitalopram 20 MG tablet Commonly known as: LEXAPRO Take 1 tablet (20 mg total) by mouth daily. What changed:  when to take this reasons to take this   Fish Oil 1000 MG Caps Take 2,000 mg by mouth daily.   ibuprofen 200 MG tablet Commonly known as: ADVIL Take 400-600 mg by mouth every 6 (six) hours as needed for headache,  fever or mild pain.   MAGNESIUM PO Take 1 tablet by mouth daily.   multivitamin with minerals Tabs tablet Take 1 tablet by mouth daily.   nirmatrelvir/ritonavir EUA 20 x 150 MG & 10 x  Tabs Commonly known as: PAXLOVID Take 3 tablets by mouth 2 (two) times daily for 3 days. Patient GFR is >60. Take nirmatrelvir (150 mg) two tablets twice daily for 3 days and ritonavir (100 mg) one tablet twice daily for 3 days. Start taking on: August 13, 2021   PROBIOTIC PO Take 3 capsules by mouth daily. Nature Made   THERAFLU FLU/COLD/COUGH PO Take 1  tablet by mouth 2 (two) times daily as needed (congestion).   UNABLE TO FIND Take 500-1,000 mg by mouth daily. CBD oil   VITA-C PO Take 1 tablet by mouth daily.   Vumerity 231 MG Cpdr Generic drug: Diroximel Fumarate Take 462 mg by mouth in the morning and at bedtime.        No Known Allergies  Consultations: None   Procedures/Studies: MR BRAIN W WO CONTRAST  Result Date: 08/10/2021 CLINICAL DATA:  Initial evaluation for multiple sclerosis. EXAM: MRI HEAD WITHOUT AND WITH CONTRAST TECHNIQUE: Multiplanar, multiecho pulse sequences of the brain and surrounding structures were obtained without and with intravenous contrast. CONTRAST:  8.61mL GADAVIST GADOBUTROL 1 MMOL/ML IV SOLN COMPARISON:  MRI from 09/21/2019. FINDINGS: Brain: Examination somewhat technically limited by motion artifact. Additionally, an axial DWI sequence is not available for review at time of this dictation. Mildly advanced cerebral atrophy for age, stable. Again seen are multiple scattered T2/FLAIR hyperintense foci involving the brainstem, with involvement of the medulla, pons, and midbrain. Patchy involvement of the cerebellum with involvement of the left middle cerebellar peduncle. Few scattered foci seen involving the periventricular and deep white matter of both cerebral hemispheres. Findings are presumably related to history of chronic demyelinating disease. No abnormal enhancement to suggest active demyelination. In comparison with previous exam, there appears to be at least 1 new focus involving the central midbrain w (series 17, image 19), not definitely seen on prior study, suggesting minimal disease progression. No associated enhancement or restricted diffusion to suggest active demyelination. No evidence for acute or subacute infarct. Gray-white matter differentiation otherwise maintained. No areas of chronic cortical infarction. No evidence for acute or chronic intracranial hemorrhage. No mass lesion, mass effect  or midline shift. No hydrocephalus or extra-axial fluid collection. Pituitary gland suprasellar region normal. Midline structures intact and normal. Vascular: Major intracranial vascular flow voids are maintained. Skull and upper cervical spine: Craniocervical junction within normal limits. Partially visualized upper cervical spinal cord unremarkable. Bone marrow signal intensity within normal limits. No scalp soft tissue abnormality. Sinuses/Orbits: Globes and orbital soft tissues within normal limits. Mild scattered mucosal thickening noted within the ethmoidal air cells. Paranasal sinuses are otherwise clear. No significant mastoid effusion. Inner ear structures grossly normal. Other: None. IMPRESSION: 1. Few scattered T2/FLAIR hyperintense foci involving the supratentorial and infratentorial cerebral white matter, consistent with history of chronic demyelinating disease. In comparison with previous exam, there appears to be at least 1 new focus involving the central midbrain, not definitely seen on prior study, suggesting minimal disease progression. No associated enhancement or restricted diffusion to suggest active demyelination. 2. Underlying mildly advanced cerebral atrophy for age, stable. 3. No other acute intracranial abnormality. Electronically Signed   By: Rise Mu M.D.   On: 08/10/2021 01:47   MR CERVICAL SPINE W WO CONTRAST  Result Date: 08/10/2021 CLINICAL DATA:  Initial  evaluation for multiple sclerosis, MS flare. EXAM: MRI CERVICAL SPINE WITHOUT AND WITH CONTRAST TECHNIQUE: Multiplanar and multiecho pulse sequences of the cervical spine, to include the craniocervical junction and cervicothoracic junction, were obtained without and with intravenous contrast. CONTRAST:  8.69mL GADAVIST GADOBUTROL 1 MMOL/ML IV SOLN COMPARISON:  Comparison made with previous MRI from 09/21/2019. FINDINGS: Alignment: Physiologic with preservation of the normal cervical lordosis. Trace retrolisthesis of C3  on C4, stable. Vertebrae: Vertebral body height maintained without acute or interval fracture. Bone marrow signal intensity within normal limits. No discrete or worrisome osseous lesions. No abnormal marrow edema. Cord: Evaluation of the cervical spinal cord is mildly limited by motion artifact on this exam. Patchy signal abnormality again noted involving the visualized medulla and brainstem. Within the cord itself, patchy and hazy signal abnormality seen extending from C2 through approximately C7-T1, relatively similar in appearance as compared to previous. No definite new lesions or evidence for significant disease progression. No associated enhancement to suggest active demyelination. Mild cord atrophy at the level of C4 through C6 noted, similar. Posterior Fossa, vertebral arteries, paraspinal tissues: Craniocervical junction within normal limits. Paraspinous soft tissues normal. Normal flow voids seen within the vertebral arteries bilaterally. Disc levels: C2-C3: Small central disc protrusion indents the ventral thecal sac. No significant spinal stenosis or cord deformity. Foramina remain patent. C3-C4: Broad-based posterior disc osteophyte complex flattens and partially effaces the ventral thecal sac. Mild spinal stenosis without frank cord impingement. Left greater than right uncovertebral spurring with resultant moderate bilateral C4 foraminal stenosis, slightly worse on the left. C4-C5: Mild disc bulge with uncovertebral hypertrophy. Superimposed tiny central disc protrusion indents the ventral thecal sac (series 15, image 12). No significant spinal stenosis. Mild to moderate bilateral C5 foraminal narrowing. C5-C6: Mild degenerative intervertebral disc space narrowing with diffuse disc osteophyte complex. Flattening and partial effacement of the ventral thecal sac with resultant mild spinal stenosis. Moderate right worse than left C6 foraminal narrowing. C6-C7: Degenerative intervertebral disc space  narrowing with diffuse disc osteophyte complex. No significant spinal stenosis. Moderate bilateral C7 foraminal narrowing, slightly worse on the left. C7-T1: Negative interspace. Mild facet hypertrophy, worse on the left. No stenosis. IMPRESSION: 1. Patchy signal abnormality involving the cervical spinal cord extending from C2 through C7-T1, consistent with history of chronic demyelinating disease. Overall, appearance is not significantly changed or progressed from prior. No enhancement to suggest active demyelination. 2. Multilevel cervical spondylosis with resultant mild spinal stenosis at C3-4 and C5-6. Moderate bilateral C4 through C7 foraminal narrowing as above. Electronically Signed   By: Rise Mu M.D.   On: 08/10/2021 02:05   MR THORACIC SPINE W WO CONTRAST  Result Date: 08/10/2021 CLINICAL DATA:  Initial evaluation for history of multiple sclerosis, acute flare. EXAM: MRI THORACIC WITHOUT AND WITH CONTRAST TECHNIQUE: Multiplanar and multiecho pulse sequences of the thoracic spine were obtained without and with intravenous contrast. CONTRAST:  8.54mL GADAVIST GADOBUTROL 1 MMOL/ML IV SOLN COMPARISON:  None available. FINDINGS: Alignment: Physiologic with preservation of the normal thoracic kyphosis. No listhesis. Vertebrae: Vertebral body height maintained without acute or chronic fracture. Bone marrow signal intensity within normal limits. No worrisome osseous lesions. No abnormal marrow edema or enhancement. Cord: Signal intensity within the thoracic spinal cord is grossly within normal limits, although evaluation mildly limited by motion. No convincing cord signal changes to suggest demyelinating disease. Normal cord caliber and morphology. No abnormal enhancement. Paraspinal and other soft tissues: Paraspinous soft tissues demonstrate no acute finding. Few small exophytic benign-appearing cyst partially visualized  about the kidneys. Atelectatic changes seen dependently within the visualized  lung bases. Disc levels: T11-12: Left paracentral disc protrusion with slight superior migration indents the ventral thecal sac (series 21, image 9). No significant spinal stenosis or cord impingement. Foramina remain patent. Otherwise, no other significant disc pathology seen elsewhere within the thoracic spine. No other stenosis or neural impingement. Foramina remain patent. IMPRESSION: 1. Normal MRI appearance of the thoracic spinal cord. No evidence for demyelinating disease. 2. Left paracentral disc protrusion at T11-12 without significant stenosis or neural impingement. Electronically Signed   By: Rise Mu M.D.   On: 08/10/2021 02:15   DG Chest Port 1 View  Result Date: 08/09/2021 CLINICAL DATA:  Shortness of breath. congestion. EXAM: PORTABLE CHEST 1 VIEW COMPARISON:  None. FINDINGS: 0743 hours. Low lung volumes. Patchy airspace disease noted medial right lung base, suspicious for pneumonia. Probable atelectasis left base. No edema or substantial pleural effusion. The cardiopericardial silhouette is within normal limits for size. The visualized bony structures of the thorax show no acute abnormality. IMPRESSION: Low volume film with patchy airspace opacity medial right lung base, suspicious for pneumonia. Follow-up imaging recommended to ensure resolution. Electronically Signed   By: Kennith Center M.D.   On: 08/09/2021 07:57    Discharge Exam: Vitals:   08/11/21 1931 08/12/21 0312  BP: 133/83 125/83  Pulse: 78 76  Resp: 16 16  Temp: 98.3 F (36.8 C) 98 F (36.7 C)  SpO2: 99% 95%    General: Pt is alert, awake, not in acute distress Cardiovascular: RRR, S1/S2 +, no edema Respiratory: CTA bilaterally, no wheezing, no rhonchi, no respiratory distress, no conversational dyspnea  Abdominal: Soft, NT, ND, bowel sounds + Extremities: no edema, no cyanosis Psych: Normal mood and affect, stable judgement and insight     The results of significant diagnostics from this  hospitalization (including imaging, microbiology, ancillary and laboratory) are listed below for reference.     Microbiology: Recent Results (from the past 240 hour(s))  Culture, blood (Routine X 2) w Reflex to ID Panel     Status: None (Preliminary result)   Collection Time: 08/09/21  8:00 AM   Specimen: BLOOD RIGHT WRIST  Result Value Ref Range Status   Specimen Description   Final    BLOOD RIGHT WRIST Performed at [facility], [address], [place], [place] [address]    Special Requests   Final    BOTTLES DRAWN AEROBIC AND ANAEROBIC Blood Culture results may not be optimal due to an inadequate volume of blood received in culture bottles Performed at [facility], [address], [name], [place] [address]    Culture   Final    NO GROWTH 3 DAYS Performed at [facility], [address] [place], [place] [address]    Report Status PENDING  Incomplete  Resp Panel by RT-PCR (Flu A&B, Covid) Peripheral     Status: Abnormal   Collection Time: 08/09/21  8:00 AM   Specimen: Peripheral; Nasopharyngeal(NP) swabs in vial transport medium  Result Value Ref Range Status   SARS Coronavirus 2 by RT PCR POSITIVE (A) NEGATIVE Final    Comment: (NOTE) SARS-CoV-2 target nucleic acids are DETECTED.  The SARS-CoV-2 RNA is generally detectable in upper respiratory specimens during the acute phase of infection. Positive results are indicative of the presence of the identified virus, but do not rule out bacterial infection or co-infection with other pathogens not detected by the test. Clinical correlation with patient history  and other diagnostic information is necessary to determine patient infection status. The expected result is Negative.  Fact Sheet for Patients: BloggerCourse.com  Fact Sheet for Healthcare Providers: SeriousBroker.it  This test is not yet approved or cleared by the [place] FDA and  has been authorized for detection and/or diagnosis of SARS-CoV-2 by FDA under an Emergency Use Authorization (EUA).  This EUA will remain in effect (meaning this test can be used) for the duration of  the COVID-19 declaration under Section 564(b)(1) of the A ct, 21 U.S.C. section 360bbb-3(b)(1), unless the authorization is terminated or revoked sooner.     Influenza A by PCR NEGATIVE NEGATIVE Final   Influenza B by PCR NEGATIVE NEGATIVE Final    Comment: (NOTE) The Xpert Xpress SARS-CoV-2/FLU/RSV plus assay is intended as an aid in the diagnosis of influenza from Nasopharyngeal swab specimens and should not be used as a sole basis for treatment. Nasal washings and aspirates are unacceptable for Xpert Xpress SARS-CoV-2/FLU/RSV testing.  Fact Sheet for Patients: BloggerCourse.com  Fact Sheet for Healthcare Providers: SeriousBroker.it  This test is not yet approved or cleared by the [place] FDA and has been authorized for detection and/or diagnosis of SARS-CoV-2 by FDA under an Emergency Use Authorization (EUA). This EUA will remain in effect (meaning this test can be used) for the duration of the COVID-19 declaration under Section 564(b)(1) of the Act, 21 U.S.C. section 360bbb-3(b)(1), unless the authorization is terminated or revoked.  Performed at [facility], [address] [place], [place] [address]   Culture, blood (Routine X 2) w Reflex to ID Panel     Status: None (Preliminary result)   Collection Time: 08/10/21 12:52 AM   Specimen: Left Antecubital; Blood  Result Value Ref Range Status   Specimen Description   Final    LEFT ANTECUBITAL Performed at [facility], [address] [place], [place] [address]    Special Requests   Final    BOTTLES DRAWN AEROBIC AND ANAEROBIC Blood Culture results may not be optimal due to an inadequate volume of blood received in  culture bottles Performed at [facility], [address], [place], [place] [address]    Culture   Final    NO GROWTH 2 DAYS Performed at [facility], [address], [place], [place] [address]    Report Status PENDING  Incomplete     Labs: Basic Metabolic Panel: Recent Labs  Lab 08/09/21 0800 08/10/21 0328 08/11/21 0542 08/12/21 0551  NA 136 138 139 137  K 4.1 4.0 4.1 4.1  CL 103 104 104 102  CO2 21* 26 26 26   GLUCOSE 126* 148* 155* 174*  BUN 9 13 19 17   CREATININE 0.81 0.83 0.71 0.88  CALCIUM 8.7* 8.8* 8.8* 8.9   Liver Function Tests: Recent Labs  Lab 08/09/21 0800 08/10/21 0328 08/11/21 0542 08/12/21 0551  AST 23 30 19 16   ALT 28 37 30 27  ALKPHOS 44 35* 33* 36*  BILITOT 1.2 1.3* 0.7 0.7  PROT 7.4 6.2* 6.4* 6.7  ALBUMIN 4.3 3.3* 3.3* 3.6   CBC: Recent Labs  Lab 08/09/21 0800 08/10/21 0328 08/11/21 0542 08/12/21 0551  WBC 9.0 7.6 8.1 8.2  NEUTROABS 7.6 6.9 7.3 7.2  HGB 16.2 13.5 13.8 14.8  HCT 50.1 39.1 41.3 44.5  MCV 88.0 84.4 85.9 86.7  PLT 142* 143* 167 181   D-Dimer Recent Labs    08/10/21 0328 08/11/21 0542  DDIMER 1.35* 0.33  Anemia work up Recent Labs    08/10/21 0327 08/11/21 0542  FERRITIN 169 258   Urinalysis    Component Value Date/Time   COLORURINE YELLOW 08/09/2021 0943   APPEARANCEUR CLEAR 08/09/2021 0943   LABSPEC 1.014 08/09/2021 0943   PHURINE 6.0 08/09/2021 0943   GLUCOSEU NEGATIVE 08/09/2021 0943   HGBUR NEGATIVE 08/09/2021 0943   BILIRUBINUR NEGATIVE 08/09/2021 0943   BILIRUBINUR - 05/10/2019 1158   KETONESUR 80 (A) 08/09/2021 0943   PROTEINUR NEGATIVE 08/09/2021 0943   UROBILINOGEN negative (A) 05/10/2019 1158   NITRITE NEGATIVE 08/09/2021 0943   LEUKOCYTESUR NEGATIVE 08/09/2021 0943   Microbiology Recent Results (from the past 240 hour(s))  Culture, blood (Routine X 2) w Reflex to ID Panel     Status: None (Preliminary result)   Collection Time: 08/09/21  8:00 AM   Specimen:  BLOOD RIGHT WRIST  Result Value Ref Range Status   Specimen Description   Final    BLOOD RIGHT WRIST Performed at [facility], [address], [place], [place] [address]    Special Requests   Final    BOTTLES DRAWN AEROBIC AND ANAEROBIC Blood Culture results may not be optimal due to an inadequate volume of blood received in culture bottles Performed at [facility], [address], [place], [place] [address]    Culture   Final    NO GROWTH 3 DAYS Performed at [facility], [address], [place], [place] [address]    Report Status PENDING  Incomplete  Resp Panel by RT-PCR (Flu A&B, Covid) Peripheral     Status: Abnormal   Collection Time: 08/09/21  8:00 AM   Specimen: Peripheral; Nasopharyngeal(NP) swabs in vial transport medium  Result Value Ref Range Status   SARS Coronavirus 2 by RT PCR POSITIVE (A) NEGATIVE Final    Comment: (NOTE) SARS-CoV-2 target nucleic acids are DETECTED.  The SARS-CoV-2 RNA is generally detectable in upper respiratory specimens during the acute phase of infection. Positive results are indicative of the presence of the identified virus, but do not rule out bacterial infection or co-infection with other pathogens not detected by the test. Clinical correlation with patient history and other diagnostic information is necessary to determine patient infection status. The expected result is Negative.  Fact Sheet for Patients: BloggerCourse.com  Fact Sheet for Healthcare Providers: SeriousBroker.it  This test is not yet approved or cleared by the [place] FDA and  has been authorized for detection and/or diagnosis of SARS-CoV-2 by FDA under an Emergency Use Authorization (EUA).  This EUA will remain in effect (meaning this test can be used) for the duration of  the COVID-19 declaration under Section 564(b)(1) of the A ct, 21 U.S.C. section 360bbb-3(b)(1),  unless the authorization is terminated or revoked sooner.     Influenza A by PCR NEGATIVE NEGATIVE Final   Influenza B by PCR NEGATIVE NEGATIVE Final    Comment: (NOTE) The Xpert Xpress SARS-CoV-2/FLU/RSV plus assay is intended as an aid in the diagnosis of influenza from Nasopharyngeal swab specimens and should not be used as a sole basis for treatment. Nasal washings and aspirates are unacceptable for Xpert Xpress SARS-CoV-2/FLU/RSV testing.  Fact Sheet for Patients: BloggerCourse.com  Fact Sheet for Healthcare Providers: SeriousBroker.it  This test is not yet approved or cleared by the [place] FDA and has been authorized for detection and/or diagnosis of SARS-CoV-2 by FDA under an Emergency Use Authorization (EUA). This EUA will remain in effect (meaning this test can  be used) for the duration of the COVID-19 declaration under Section 564(b)(1) of the Act, 21 U.S.C. section 360bbb-3(b)(1), unless the authorization is terminated or revoked.  Performed at [facility], [address], [place], [place] [address]   Culture, blood (Routine X 2) w Reflex to ID Panel     Status: None (Preliminary result)   Collection Time: 08/10/21 12:52 AM   Specimen: Left Antecubital; Blood  Result Value Ref Range Status   Specimen Description   Final    LEFT ANTECUBITAL Performed at [facility], [address] [place], [place] [address]    Special Requests   Final    BOTTLES DRAWN AEROBIC AND ANAEROBIC Blood Culture results may not be optimal due to an inadequate volume of blood received in culture bottles Performed at [facility], [address] [place], [place] [address]    Culture   Final    NO GROWTH 2 DAYS Performed at [facility], [address] [place], [place] [address]    Report Status PENDING  Incomplete     Patient was seen and examined on the day of  discharge and was found to be in stable condition. Time coordinating discharge: 35 minutes including assessment and coordination of care, as well as examination of the patient.   SIGNED:  Sharlene Dory, DO [facility] 08/12/2021, 2:24 PM

## 2021-08-12 NOTE — TOC Transition Note (Signed)
Transition of Care [facility]) - CM/SW Discharge Note   Patient Details  Name: Alexander [name] MRN: [id number] Date of Birth: 09-16-1975  Transition [facility]) CM/SW Contact:  Ameira Alessandrini, Vinnie Langton, LCSW Phone Number: 08/12/2021, 10:15 AM   Clinical Narrative:     Patient agreeable to home health services through [facility].  Barbara Cower, Representative with [facility] [phone]) given orders for home health physical and occupational therapy services.  Plan is for discharge home today with home health services in place.  Final next level of care: Home w Home Health Services Barriers to Discharge: No Barriers Identified   Patient Goals and CMS Choice Patient states their goals for this hospitalization and ongoing recovery are:: Return Home with Home Health Services CMS Medicare.gov Compare Post Acute Care list provided to:: Patient Choice offered to / list presented to : NA  Discharge Placement               N/A        Discharge Plan and Services                DME Arranged: N/A DME Agency: NA       HH Arranged: PT, OT HH Agency: [facility] Date HH Agency Contacted: 08/12/21 Time HH Agency Contacted: 1012 Representative spoke with at [facility] Agency: Barbara Cower  Social Determinants of Health (SDOH) Interventions     Readmission Risk Interventions No flowsheet data found.

## 2021-08-13 ENCOUNTER — Telehealth: Payer: Self-pay

## 2021-08-13 NOTE — Telephone Encounter (Addendum)
Transition Care Management Unsuccessful Follow-up Telephone Call  Date of discharge and from where:  TCM DC [facility] 08-12-21 Dx: COVID  Attempts:  1st Attempt  Reason for unsuccessful TCM follow-up call:  Left voice message  Transition Care Management Unsuccessful Follow-up Telephone Call  Date of discharge and from where:  TCM DC [facility] 08-12-21 Dx: COVID  Attempts:  2nd Attempt  Reason for unsuccessful TCM follow-up call:  Left voice message  Transition Care Management Follow-up Telephone Call Date of discharge and from where:TCM DC [facility] 08-12-21 Dx: COVID  How have you been since you were released from the hospital? Feeling better  Any questions or concerns? No  Items Reviewed: Did the pt receive and understand the discharge instructions provided? Yes  Medications obtained and verified? Yes  Other? No  Any new allergies since your discharge? No  Dietary orders reviewed? Yes Do you have support at home? Yes   Home Care and Equipment/Supplies: Were home health services ordered? Yes, PT If so, what is the name of the agency? Name unknown- Has the agency set up a time to come to the patient's home? Yes 08-14-21 at 230pm Were any new equipment or medical supplies ordered?  No What is the name of the medical supply agency? na Were you able to get the supplies/equipment? no Do you have any questions related to the use of the equipment or supplies? No  Functional Questionnaire: (I = Independent and D = Dependent) ADLs: I  Bathing/Dressing- I  Meal Prep- I  Eating- I  Maintaining continence- I  Transferring/Ambulation- I  Managing Meds- I  Follow up appointments reviewed:  PCP Hospital f/u appt confirmed? Yes  [facility] Rid[facility]? No  . Are transportation arrangements needed? No  If their condition worsens, is the pt aware to call PCP or go to the Emergency Dept.? Yes Was the patient provided  with contact information for the PCP's office or ED? Yes Was to pt encouraged to call back with questions or concerns? Yes

## 2021-08-14 ENCOUNTER — Telehealth: Payer: Self-pay | Admitting: Family Medicine

## 2021-08-14 DIAGNOSIS — I10 Essential (primary) hypertension: Secondary | ICD-10-CM | POA: Diagnosis not present

## 2021-08-14 DIAGNOSIS — G35 Multiple sclerosis: Secondary | ICD-10-CM | POA: Diagnosis not present

## 2021-08-14 DIAGNOSIS — E785 Hyperlipidemia, unspecified: Secondary | ICD-10-CM | POA: Diagnosis not present

## 2021-08-14 DIAGNOSIS — J181 Lobar pneumonia, unspecified organism: Secondary | ICD-10-CM | POA: Diagnosis not present

## 2021-08-14 DIAGNOSIS — U071 COVID-19: Secondary | ICD-10-CM | POA: Diagnosis not present

## 2021-08-14 DIAGNOSIS — F419 Anxiety disorder, unspecified: Secondary | ICD-10-CM | POA: Diagnosis not present

## 2021-08-14 LAB — CULTURE, BLOOD (ROUTINE X 2): Culture: NO GROWTH

## 2021-08-14 NOTE — Telephone Encounter (Signed)
Aliya, a physical therapist with [facility] called to get verbal orders for PT ,one time a week for six weeks.      Good callback number is [phone] Okay to voicemails    Please advise

## 2021-08-15 LAB — CULTURE, BLOOD (ROUTINE X 2): Culture: NO GROWTH

## 2021-08-15 NOTE — Telephone Encounter (Signed)
Please advise if okay for orders.

## 2021-08-15 NOTE — Telephone Encounter (Signed)
Please okay these orders  ?

## 2021-08-16 NOTE — Telephone Encounter (Signed)
Left detailed message for [facility] regarding approval for verbal orders requested

## 2021-08-22 ENCOUNTER — Encounter: Payer: Self-pay | Admitting: Neurology

## 2021-08-23 ENCOUNTER — Other Ambulatory Visit: Payer: Self-pay | Admitting: *Deleted

## 2021-08-23 ENCOUNTER — Telehealth: Payer: Self-pay | Admitting: Neurology

## 2021-08-23 DIAGNOSIS — R252 Cramp and spasm: Secondary | ICD-10-CM

## 2021-08-23 DIAGNOSIS — R531 Weakness: Secondary | ICD-10-CM

## 2021-08-23 DIAGNOSIS — R261 Paralytic gait: Secondary | ICD-10-CM

## 2021-08-23 DIAGNOSIS — G35 Multiple sclerosis: Secondary | ICD-10-CM

## 2021-08-23 NOTE — Telephone Encounter (Signed)
Sent a message to [place] with [facility].

## 2021-08-27 ENCOUNTER — Ambulatory Visit (INDEPENDENT_AMBULATORY_CARE_PROVIDER_SITE_OTHER): Payer: Medicare Other | Admitting: Family Medicine

## 2021-08-27 ENCOUNTER — Encounter: Payer: Self-pay | Admitting: Family Medicine

## 2021-08-27 VITALS — BP 124/86 | HR 96 | Temp 98.2°F | Wt 170.0 lb

## 2021-08-27 DIAGNOSIS — Z8616 Personal history of COVID-19: Secondary | ICD-10-CM | POA: Diagnosis not present

## 2021-08-27 DIAGNOSIS — U071 COVID-19: Secondary | ICD-10-CM

## 2021-08-27 DIAGNOSIS — G35 Multiple sclerosis: Secondary | ICD-10-CM | POA: Diagnosis not present

## 2021-08-27 DIAGNOSIS — J189 Pneumonia, unspecified organism: Secondary | ICD-10-CM | POA: Diagnosis not present

## 2021-08-27 NOTE — Progress Notes (Signed)
° °  Subjective:    Patient ID: Alexander [name], male    DOB: Dec 04, 1975, 46 y.o.   MRN: [id number]  HPI Here for a transitional care visit after a hospital stay from 08-09-21 to 08-12-21 for a Covid-19 infection and a RLL pneumonia. He presented with fever, weakness, and a cough. No SOB or chest pain. He was treated with IV Remdesivir and then sent home on Paxlovid, Keflex, and Azithromycin. Since then he feels like he is almost back to normal except for some residual weakness. He is getting PT and OT at home. No SOB or cough. Appetite is good. He had some transient reactive thrombocytopenia to 142 K, but this went back  up to 181 K by the time of DC.    Review of Systems  Constitutional: Negative.   Respiratory: Negative.    Cardiovascular: Negative.   Gastrointestinal: Negative.   Genitourinary: Negative.   Neurological:  Positive for weakness.      Objective:   Physical Exam Constitutional:      Appearance: Normal appearance.     Comments: In a wheelchair (although he says he walks with a walker around his house)   Cardiovascular:     Rate and Rhythm: Normal rate and regular rhythm.     Pulses: Normal pulses.     Heart sounds: Normal heart sounds.  Pulmonary:     Effort: Pulmonary effort is normal. No respiratory distress.     Breath sounds: Normal breath sounds. No stridor. No wheezing, rhonchi or rales.  Neurological:     General: No focal deficit present.     Mental Status: He is alert and oriented to person, place, and time.          Assessment & Plan:  This is a patient with MS who is recovering from a CAP and a Covid-19 infection. He is doing well and his pulmonary status is back to baseline. He will continue with PT and OT to get his strength back to baseline. Recheck as needed. [name], MD

## 2021-08-28 DIAGNOSIS — U071 COVID-19: Secondary | ICD-10-CM | POA: Diagnosis not present

## 2021-08-28 DIAGNOSIS — J181 Lobar pneumonia, unspecified organism: Secondary | ICD-10-CM | POA: Diagnosis not present

## 2021-08-28 DIAGNOSIS — I10 Essential (primary) hypertension: Secondary | ICD-10-CM | POA: Diagnosis not present

## 2021-08-28 DIAGNOSIS — G35 Multiple sclerosis: Secondary | ICD-10-CM | POA: Diagnosis not present

## 2021-08-28 DIAGNOSIS — E785 Hyperlipidemia, unspecified: Secondary | ICD-10-CM

## 2021-08-28 DIAGNOSIS — F419 Anxiety disorder, unspecified: Secondary | ICD-10-CM

## 2021-09-04 NOTE — Telephone Encounter (Signed)
Sent an email to [place] with [facility] to check the status.

## 2021-09-06 ENCOUNTER — Encounter (HOSPITAL_COMMUNITY): Payer: Self-pay | Admitting: Radiology

## 2021-09-13 DIAGNOSIS — E785 Hyperlipidemia, unspecified: Secondary | ICD-10-CM | POA: Diagnosis not present

## 2021-09-13 DIAGNOSIS — U071 COVID-19: Secondary | ICD-10-CM | POA: Diagnosis not present

## 2021-09-13 DIAGNOSIS — G35 Multiple sclerosis: Secondary | ICD-10-CM | POA: Diagnosis not present

## 2021-09-13 DIAGNOSIS — I10 Essential (primary) hypertension: Secondary | ICD-10-CM | POA: Diagnosis not present

## 2021-09-13 DIAGNOSIS — J181 Lobar pneumonia, unspecified organism: Secondary | ICD-10-CM | POA: Diagnosis not present

## 2021-09-13 DIAGNOSIS — F419 Anxiety disorder, unspecified: Secondary | ICD-10-CM | POA: Diagnosis not present

## 2021-09-17 ENCOUNTER — Telehealth: Payer: Self-pay | Admitting: Neurology

## 2021-09-17 ENCOUNTER — Other Ambulatory Visit: Payer: Self-pay | Admitting: *Deleted

## 2021-09-17 ENCOUNTER — Encounter: Payer: Self-pay | Admitting: Neurology

## 2021-09-17 DIAGNOSIS — G35 Multiple sclerosis: Secondary | ICD-10-CM

## 2021-09-17 MED ORDER — VUMERITY 231 MG PO CPDR: 462.0000 mg | DELAYED_RELEASE_CAPSULE | Freq: Two times a day (BID) | ORAL | 3 refills | Status: DC

## 2021-09-17 NOTE — Telephone Encounter (Signed)
Submitted PA Vumerity on CMM. Key: [id number]. Waiting on determination from [facility] [place] Medicare Part D.

## 2021-09-17 NOTE — Telephone Encounter (Signed)
@   1:26 Raynelle Fanning @ [facility] [phone] option 5) states it is time sensitive that they are given a call back re: the quantity of pt's Diroximel Fumarate (VUMERITY) 231 MG CPDR

## 2021-09-17 NOTE — Telephone Encounter (Signed)
Called and spoke w/ Renea Ee. Verified qty 360 caps for 90 days supply. She confirmed PA approved. She will notate his account, nothing further needed.

## 2021-10-08 ENCOUNTER — Ambulatory Visit: Payer: Medicare Other | Admitting: Family Medicine

## 2021-10-08 ENCOUNTER — Other Ambulatory Visit: Payer: Self-pay

## 2021-10-08 ENCOUNTER — Encounter: Payer: Self-pay | Admitting: Family Medicine

## 2021-10-08 VITALS — BP 133/86 | HR 83 | Ht 68.0 in | Wt 170.0 lb

## 2021-10-08 DIAGNOSIS — G35 Multiple sclerosis: Secondary | ICD-10-CM

## 2021-10-08 DIAGNOSIS — Z79899 Other long term (current) drug therapy: Secondary | ICD-10-CM | POA: Diagnosis not present

## 2021-10-08 DIAGNOSIS — R261 Paralytic gait: Secondary | ICD-10-CM

## 2021-10-08 DIAGNOSIS — R252 Cramp and spasm: Secondary | ICD-10-CM

## 2021-10-08 MED ORDER — BACLOFEN 10 MG PO TABS: ORAL_TABLET | ORAL | 3 refills | Status: DC

## 2021-10-08 NOTE — Patient Instructions (Signed)
Below is our plan: ? ?We will continue Vumerity and baclofen. I recommend stopping escitalopram if not taken regularly.  ? ?Please make sure you are staying well hydrated. I recommend 50-60 ounces daily. Well balanced diet and regular exercise encouraged. Consistent sleep schedule with 6-8 hours recommended.  ? ?Please continue follow up with care team as directed.  ? ?Follow up with Dr Epimenio Foot in 6 months  ? ?You may receive a survey regarding today's visit. I encourage you to leave honest feed back as I do use this information to improve patient care. Thank you for seeing me today!  ? ? ?

## 2021-10-08 NOTE — Progress Notes (Signed)
Chief Complaint  Patient presents with   Follow-up    Rm 16, alone. Here for 6 month MS f/u, on Vumerity and tolerating well. Pt reports MS is stable. Using a rollator walker and moving slowly.      HISTORY OF PRESENT ILLNESS:  10/08/21 ALL: Alexander [name] is a 46 y.o. male here today for follow up for relapsing/active SPMS. He continues Vumerity and tolerating well. Labs stable 08/2021. MRI brain 08/2021 showed one new focus in central midbrain in comparison to 2021 imaging. Cervical and thoracic imaging was stable.   He was admitted to [facility] 08/09/2021. He was treated for RLL PNA in setting of Covid. He was treated with Paxlovid, azithromycin and Keflex. He was discharged home 08/12/2021 with home health PT. Since, he is doing well. He feels PT is helping. They are coming twice weekly through April. He continues to use his Rolator. Legs seem a little stronger. He is taking baclofen as needed. He has noted more coldness of his hands and feet over the past month.   He feels mood is stable. He continues escitalopram. He has been taking only as needed. Usually about 1-2 times a week. He does not feel he is having any significant concerns with anxiety. He is sleeping well. No changes in vision, bowel or bladder habits.    HISTORY (copied from Dr Bonnita Hollow previous note)  Alexander [name] is a 46 y.o. man with a relapsing/active form of secondary progressive multiple sclerosis diagnosed in 2003.      Update 04/10/2021: He is on Vumerity and tolerates it well.   ., He tolerates it very well.   No stomach upset or flushing (just once).    He has no exacerbations.      Gait and balance are off.  He uses a walker both inside and outside.   He has had a couple fall due to tripping and balance   Falls tend to be backwards.  He does worse in the heat.He has some weakness in legs right > left and to a lesser extent in the arms (right arm seems weaker but left arm is more spastic and less skilled).     He has  spasticity and takes baclofen.  He skips some doses.    Bladder function is fine.   Vision is doing the same (he has ON history).  [name]      He feels fatigue is mostly stable.  He does worse in heat.  [name]   He still takes a nap daily.   He sleeps 6-8 hours at night.  Sometimes he is not refreshed in the morning.  He feels depression better than last year.  He is sometimes irritable.   Cognition is doing well.   MS history: In 2003 he had optic neuritis started on the left and then also on the right. He had an MRI of the brain performed at that time and was told that it was normal. He had follow-up MRIs every year or 2 for several more years but they continue to be normal. In 2012, he noticed worsening issues with gait. He had had some difficulty with the gait for the previous couple of years but due to orthopedic issues a neurologic cause was not looked into. An MRI of the cervical spine showed multiple plaques consistent with MS. Initially, he was placed on Copaxone. Although he tolerated Copaxone well he had some breakthrough disease with a new spot developing in the brain. Therefore he was switched  to Tysabri. He tolerated Tysabri well and his MS was well controlled. However, she was JCV antibody positive a decision was made to stop Tysabri after 2 years. Years ago he went on Gilenya. He has tolerated it well and he did better on Tysabri. He was seeing Dr. Carroll Sage at [facility] on [name]. He had Ocrevus August 2017.  He switched from Ocrevus to Vumerity January 2021 due to continued progression and fatigue while on Ocrevus.   MRI images MRI of the brain 09/21/2019 showed multiple T2/FLAIR hyperintense foci, mostly in the infratentorial white matter in a pattern and configuration consistent with chronic demyelinating plaque associated with multiple sclerosis.  None of the foci enhances or appears to be acute.  Compared to the MRI dated 02/18/2018, there are no new lesions.   MRI of the  cervical spine 09/21/2019 showed patchy hyperintense signal within the spinal cord from C2-C6.  Another small focus is noted at C7-T1 to the right.  Compared to the MRI from 02/18/2018, there are no new lesions.  Additionally, there are multilevel degenerative changes, mostly mild.  No definite nerve root compression or spinal stenosis.  REVIEW OF SYSTEMS: Out of a complete 14 system review of symptoms, the patient complains only of the following symptoms, weakness, dysesthesias, imbalance, spasticity, depression and all other reviewed systems are negative.  ALLERGIES: No Known Allergies   HOME MEDICATIONS: Outpatient Medications Prior to Visit  Medication Sig Dispense Refill   Ascorbic Acid (VITA-C PO) Take 1 tablet by mouth daily.     baclofen (LIORESAL) 10 MG tablet Take up to three times daily as needed (Patient taking differently: Take 10 mg by mouth 3 (three) times daily as needed for muscle spasms.) 90 tablet 3   Cholecalciferol (DIALYVITE VITAMIN D 5000) 125 MCG (5000 UT) capsule Take 5,000 Units by mouth daily.     Diroximel Fumarate (VUMERITY) 231 MG CPDR Take 462 mg by mouth in the morning and at bedtime. 360 capsule 3   escitalopram (LEXAPRO) 20 MG tablet Take 1 tablet (20 mg total) by mouth daily. (Patient taking differently: Take 20 mg by mouth daily as needed (anxiety).) 90 tablet 3   MAGNESIUM PO Take 1 tablet by mouth daily.     Multiple Vitamin (MULTIVITAMIN WITH MINERALS) TABS tablet Take 1 tablet by mouth daily.     Omega-3 Fatty Acids (FISH OIL) 1000 MG CAPS Take 2,000 mg by mouth daily.     Probiotic Product (PROBIOTIC PO) Take 3 capsules by mouth daily. Nature Made     UNABLE TO FIND Take 500-1,000 mg by mouth daily. CBD oil     Facility-Administered Medications Prior to Visit  Medication Dose Route Frequency Provider Last Rate Last Admin   gadopentetate dimeglumine (MAGNEVIST) injection 17 mL  17 mL Intravenous Once PRN Anson Fret, MD         PAST MEDICAL  HISTORY: Past Medical History:  Diagnosis Date   Hypertension    Multiple sclerosis (HCC) sees Dr. Despina Arias   Vision abnormalities      PAST SURGICAL HISTORY: Past Surgical History:  Procedure Laterality Date   ACHILLES TENDON SURGERY     ANTERIOR CRUCIATE LIGAMENT REPAIR     MENISCUS REPAIR       FAMILY HISTORY: Family History  Problem Relation Age of Onset   Congestive Heart Failure Father    Stroke Maternal Grandfather    Multiple sclerosis Sister      SOCIAL HISTORY: Social History   Socioeconomic History  Marital status: Divorced    Spouse name: Not on file   Number of children: Not on file   Years of education: Not on file   Highest education level: Not on file  Occupational History   Not on file  Tobacco Use   Smoking status: Never   Smokeless tobacco: Never  Vaping Use   Vaping Use: Never used  Substance and Sexual Activity   Alcohol use: No   Drug use: No   Sexual activity: Not on file  Other Topics Concern   Not on file  Social History Narrative   Not on file   Social Determinants of Health   Financial Resource Strain: Low Risk    Difficulty of Paying Living Expenses: Not hard at all  Food Insecurity: No Food Insecurity   Worried About Running Out of Food in the Last Year: Never true   Ran Out of Food in the Last Year: Never true  Transportation Needs: No Transportation Needs   Lack of Transportation (Medical): No   Lack of Transportation (Non-Medical): No  Physical Activity: Inactive   Days of Exercise per Week: 0 days   Minutes of Exercise per Session: 0 min  Stress: No Stress Concern Present   Feeling of Stress : Not at all  Social Connections: Socially Isolated   Frequency of Communication with Friends and Family: Twice a week   Frequency of Social Gatherings with Friends and Family: Twice a week   Attends Religious Services: Never   Database administrator or Organizations: No   Attends Engineer, structural: Never    Marital Status: Divorced  Catering manager Violence: Not At Risk   Fear of Current or Ex-Partner: No   Emotionally Abused: No   Physically Abused: No   Sexually Abused: No      PHYSICAL EXAM  Vitals:   10/08/21 1031  BP: 133/86  Pulse: 83  Weight: 170 lb (77.1 kg)  Height: 5\' 8"  (1.727 m)    Body mass index is 25.85 kg/m.   Generalized: Well developed, in no acute distress  Cardiology: normal rate and rhythm, no murmur auscultated  Respiratory: clear to auscultation bilaterally    Neurological examination  Mentation: Alert oriented to time, place, history taking. Follows all commands speech and language fluent Cranial nerve II-XII: Pupils were equal round reactive to light. Extraocular movements were full, visual field were full on confrontational test. Facial sensation and strength were normal. Head turning and shoulder shrug  were normal and symmetric. Motor: The motor testing reveals 5 over 5 strength of bilateral upper extremities, 3/5 bilateral lower hip flexion, 5/5 bilateral lower distal   extension, 3/5 flextion Sensory: Sensory testing is intact to soft touch on all 4 extremities. No evidence of extinction is noted.  Coordination: Cerebellar testing reveals slow finger-nose-finger, unable to perform HTS bilaterally  Gait and station: Gait is wide, slow stride but stable with Rolator.  Reflexes: Deep tendon reflexes are symmetric and brisk in bilateral lower extremities.     DIAGNOSTIC DATA (LABS, IMAGING, TESTING) - I reviewed patient records, labs, notes, testing and imaging myself where available.  Lab Results  Component Value Date   WBC 8.2 08/12/2021   HGB 14.8 08/12/2021   HCT 44.5 08/12/2021   MCV 86.7 08/12/2021   PLT 181 08/12/2021      Component Value Date/Time   NA 137 08/12/2021 0551   NA 142 02/10/2018 1438   K 4.1 08/12/2021 0551   CL 102 08/12/2021 0551  CO2 26 08/12/2021 0551   GLUCOSE 174 (H) 08/12/2021 0551   BUN 17 08/12/2021 0551    BUN 12 02/10/2018 1438   CREATININE 0.88 08/12/2021 0551   CREATININE 0.86 05/12/2020 1402   CALCIUM 8.9 08/12/2021 0551   PROT 6.7 08/12/2021 0551   PROT 7.0 04/10/2021 1042   ALBUMIN 3.6 08/12/2021 0551   ALBUMIN 4.8 04/10/2021 1042   AST 16 08/12/2021 0551   ALT 27 08/12/2021 0551   ALKPHOS 36 (L) 08/12/2021 0551   BILITOT 0.7 08/12/2021 0551   BILITOT 0.7 04/10/2021 1042   GFRNONAA >60 08/12/2021 0551   GFRAA 116 02/10/2018 1438   Lab Results  Component Value Date   CHOL 170 05/12/2020   HDL 40 05/12/2020   LDLCALC 100 (H) 05/12/2020   LDLDIRECT 164.0 05/05/2018   TRIG 187 (H) 05/12/2020   CHOLHDL 4.3 05/12/2020   No results found for: HGBA1C Lab Results  Component Value Date   VITAMINB12 715 05/12/2020   Lab Results  Component Value Date   TSH 1.29 05/12/2020    No flowsheet data found.   ASSESSMENT AND PLAN  46 y.o. year old male  has a past medical history of Hypertension, Multiple sclerosis (HCC) (sees Dr. Despina Arias), and Vision abnormalities. here with   Multiple sclerosis (HCC)  High risk medication use  Spastic gait  Spasticity  Brett Canales is fairly stable currently. Labs and imaging stable. He will continue generic Vumerity. He will continue baclofen as needed for spasticity.  I will have him discontinue escitalopram as he is not taking regularly. He will continue working with PT. Fall precautions advised. Healthy lifestyle habits encouraged. He will follow-up in 6 months with Dr. Epimenio Foot.   No orders of the defined types were placed in this encounter.   I spent 30 minutes of face-to-face and non-face-to-face time with patient.  This included previsit chart review, lab review, study review, order entry, electronic health record documentation, patient education.    Shawnie Dapper, MSN, FNP-C 10/08/2021, 10:42 AM  [facility] [address] [place], [place] [address] [phone]

## 2022-03-05 ENCOUNTER — Encounter: Payer: Self-pay | Admitting: Neurology

## 2022-03-07 ENCOUNTER — Other Ambulatory Visit: Payer: Self-pay | Admitting: *Deleted

## 2022-03-07 DIAGNOSIS — G35 Multiple sclerosis: Secondary | ICD-10-CM

## 2022-03-07 DIAGNOSIS — R252 Cramp and spasm: Secondary | ICD-10-CM

## 2022-03-07 DIAGNOSIS — R531 Weakness: Secondary | ICD-10-CM

## 2022-03-07 DIAGNOSIS — R261 Paralytic gait: Secondary | ICD-10-CM

## 2022-03-12 ENCOUNTER — Telehealth: Payer: Self-pay | Admitting: Family Medicine

## 2022-03-12 NOTE — Telephone Encounter (Signed)
Referral sent to  Refer to [facility] [phone] Fax: [phone]

## 2022-04-02 ENCOUNTER — Other Ambulatory Visit: Payer: Self-pay | Admitting: *Deleted

## 2022-04-02 ENCOUNTER — Telehealth: Payer: Self-pay | Admitting: Neurology

## 2022-04-02 DIAGNOSIS — R531 Weakness: Secondary | ICD-10-CM

## 2022-04-02 DIAGNOSIS — R252 Cramp and spasm: Secondary | ICD-10-CM

## 2022-04-02 DIAGNOSIS — G35 Multiple sclerosis: Secondary | ICD-10-CM

## 2022-04-02 DIAGNOSIS — R261 Paralytic gait: Secondary | ICD-10-CM

## 2022-04-02 NOTE — Telephone Encounter (Signed)
[place] sent me a message stating He will need a new f2f for ins. Otherwise they will not pay. Can you get him scheduled for an in office visit or a virtual visit?   Since the last time he was seen was in March.

## 2022-04-02 NOTE — Telephone Encounter (Signed)
Sent to [place] with [facility])

## 2022-04-02 NOTE — Telephone Encounter (Signed)
Sent mychart message to pt. 

## 2022-04-04 DIAGNOSIS — F32A Depression, unspecified: Secondary | ICD-10-CM | POA: Diagnosis not present

## 2022-04-04 DIAGNOSIS — F419 Anxiety disorder, unspecified: Secondary | ICD-10-CM | POA: Diagnosis not present

## 2022-04-04 DIAGNOSIS — G35 Multiple sclerosis: Secondary | ICD-10-CM | POA: Diagnosis not present

## 2022-04-04 DIAGNOSIS — H539 Unspecified visual disturbance: Secondary | ICD-10-CM | POA: Diagnosis not present

## 2022-04-04 DIAGNOSIS — Z79899 Other long term (current) drug therapy: Secondary | ICD-10-CM | POA: Diagnosis not present

## 2022-04-04 DIAGNOSIS — Z9181 History of falling: Secondary | ICD-10-CM | POA: Diagnosis not present

## 2022-04-05 ENCOUNTER — Telehealth: Payer: Self-pay | Admitting: Family Medicine

## 2022-04-05 NOTE — Telephone Encounter (Signed)
[facility]) Request verbal orders for in home PT Frequency:  1x wk for 9 wks Would like a call from the nurse.

## 2022-04-05 NOTE — Telephone Encounter (Signed)
Called and LVM providing VO. However, I advised we were previously told by [place] with them that updated visit needed to occur in order for insurance to pay since last visit 10/08/21. Advised pt has visit with MD 04/17/22 if so. Advised them to call back if any questions.

## 2022-04-17 ENCOUNTER — Encounter: Payer: Self-pay | Admitting: Neurology

## 2022-04-17 ENCOUNTER — Encounter: Payer: Self-pay | Admitting: *Deleted

## 2022-04-17 ENCOUNTER — Ambulatory Visit: Payer: Medicare Other | Admitting: Neurology

## 2022-04-17 VITALS — BP 132/96 | HR 82 | Ht 69.0 in | Wt 170.0 lb

## 2022-04-17 DIAGNOSIS — Z8669 Personal history of other diseases of the nervous system and sense organs: Secondary | ICD-10-CM

## 2022-04-17 DIAGNOSIS — R261 Paralytic gait: Secondary | ICD-10-CM

## 2022-04-17 DIAGNOSIS — R531 Weakness: Secondary | ICD-10-CM

## 2022-04-17 DIAGNOSIS — R252 Cramp and spasm: Secondary | ICD-10-CM

## 2022-04-17 DIAGNOSIS — Z79899 Other long term (current) drug therapy: Secondary | ICD-10-CM

## 2022-04-17 DIAGNOSIS — G35 Multiple sclerosis: Secondary | ICD-10-CM | POA: Diagnosis not present

## 2022-04-17 DIAGNOSIS — E559 Vitamin D deficiency, unspecified: Secondary | ICD-10-CM

## 2022-04-17 MED ORDER — ESCITALOPRAM OXALATE 20 MG PO TABS: 20.0000 mg | ORAL_TABLET | Freq: Every day | ORAL | 3 refills | Status: DC

## 2022-04-17 NOTE — Progress Notes (Signed)
[facility]  PATIENT: Alexander [name] DOB: [birth year]  REFERRING DOCTOR OR PCP:  Dr. Eleonore Chiquito (PCP)  _________________________________   HISTORICAL  CHIEF COMPLAINT:  Chief Complaint  Patient presents with   Follow-up    Pt in RM #2 and alone. Pt state he has been feeling weaker. Pt state he has weakness in his hands and leg more offering. Pt state he has back and shoulder blade pain.    HISTORY OF PRESENT ILLNESS:  Alexander [name] is a 46 y.o. man with a relapsing/active form of secondary progressive multiple sclerosis diagnosed in 2003.     Update 04/17/2022: He is on Vumerity and tolerates it well.   He tolerates it very well.   No stomach upset or flushing (just once).    He has no exacerbations.     Gait and balance are off.  He uses a walker both inside and outside.  He can go  > 100 feet.   He has had a couple fall due to tripping and balance   Falls tend to be backwards.  He does worse in the heat.  Left arm is worse still but right hand seems to be catching up with more weakness.   Left > right legs have spasticity, worse at night. He takes baclofen which helps but makes him feel weaker.  He has poor coordination, left > right  Bladder is inconsistent, sometimes with urgency but no incontinence in last 6 months.  Vision is doing the same with sequela of right optic neuritis - blurry and reduced colors.    [name]     He feels fatigue is mostly stable.  He does worse in heat.  [name]   He still takes a nap daily.   He sleeps 6-8 hours at night.  Sometimes he is not refreshed in the morning.  He feels depression better than last year.  He is sometimes irritable.   Cognition is doing well.   He was hospitalized in January 2023 for Covid and felt a lot weaker.   After fluids and a couple days he was much better.   He did PT with benefit.   MRi report mentioned one additional midbrain lesion but I see no new lesions.   Multiple cervical spine lesions look stable.     MS history: In 2003 he had optic neuritis started on the left and then also on the right. He had an MRI of the brain performed at that time and was told that it was normal. He had follow-up MRIs every year or 2 for several more years but they continue to be normal. In 2012, he noticed worsening issues with gait. He had had some difficulty with the gait for the previous couple of years but due to orthopedic issues a neurologic cause was not looked into. An MRI of the cervical spine showed multiple plaques consistent with MS. Initially, he was placed on Copaxone. Although he tolerated Copaxone well he had some breakthrough disease with a new spot developing in the brain. Therefore he was switched to Tysabri. He tolerated Tysabri well and his MS was well controlled. However, she was JCV antibody positive a decision was made to stop Tysabri after 2 years. Years ago he went on Gilenya. He has tolerated it well and he did better on Tysabri. He was seeing Dr. Carroll Sage at [facility] on [address]. He had Ocrevus August 2017.  He switched from Ocrevus to Vumerity January 2021 due to continued progression and  fatigue while on Ocrevus.  MRI images MRI of the brain 09/21/2019 showed multiple T2/FLAIR hyperintense foci, mostly in the infratentorial white matter in a pattern and configuration consistent with chronic demyelinating plaque associated with multiple sclerosis.  None of the foci enhances or appears to be acute.  Compared to the MRI dated 02/18/2018, there are no new lesions.  MRI of the cervical spine 09/21/2019 showed patchy hyperintense signal within the spinal cord from C2-C6.  Another small focus is noted at C7-T1 to the right.  Compared to the MRI from 02/18/2018, there are no new lesions.  Additionally, there are multilevel degenerative changes, mostly mild.  No definite nerve root compression or spinal stenosis.    REVIEW OF SYSTEMS: Constitutional: No fevers, chills, sweats, or  change in appetite.   Reports minimal fatigue. Eyes: No visual changes, double vision, eye pain Ear, nose and throat: No hearing loss, ear pain, nasal congestion, sore throat Cardiovascular: No chest pain, palpitations Respiratory:  No shortness of breath at rest or with exertion.   No wheezes GastrointestinaI: No nausea, vomiting, diarrhea, abdominal pain, fecal incontinence Genitourinary:  reports frequency, hesitancy and nocturia. Musculoskeletal:  No neck pain, back pain Integumentary: No rash, pruritus, skin lesions Neurological: as above Psychiatric: No depression at this time.  No anxiety Endocrine: No palpitations, diaphoresis, change in appetite, change in weigh or increased thirst Hematologic/Lymphatic:  No anemia, purpura, petechiae. Allergic/Immunologic: No itchy/runny eyes, nasal congestion, recent allergic reactions, rashes  ALLERGIES: No Known Allergies  HOME MEDICATIONS:  Current Outpatient Medications:    Ascorbic Acid (VITA-C PO), Take 1 tablet by mouth daily., Disp: , Rfl:    baclofen (LIORESAL) 10 MG tablet, Take 10mg  three times daily as needed, Disp: 270 tablet, Rfl: 3   Cholecalciferol (DIALYVITE VITAMIN D 5000) 125 MCG (5000 UT) capsule, Take 5,000 Units by mouth daily., Disp: , Rfl:    Diroximel Fumarate (VUMERITY) 231 MG CPDR, Take 462 mg by mouth in the morning and at bedtime., Disp: 360 capsule, Rfl: 3   MAGNESIUM PO, Take 1 tablet by mouth daily., Disp: , Rfl:    Multiple Vitamin (MULTIVITAMIN WITH MINERALS) TABS tablet, Take 1 tablet by mouth daily., Disp: , Rfl:    Omega-3 Fatty Acids (FISH OIL) 1000 MG CAPS, Take 2,000 mg by mouth daily., Disp: , Rfl:    Probiotic Product (PROBIOTIC PO), Take 3 capsules by mouth daily. Nature Made, Disp: , Rfl:    UNABLE TO FIND, Take 500-1,000 mg by mouth daily. CBD oil, Disp: , Rfl:    escitalopram (LEXAPRO) 20 MG tablet, Take 1 tablet (20 mg total) by mouth daily., Disp: 90 tablet, Rfl: 3  PAST MEDICAL HISTORY: Past  Medical History:  Diagnosis Date   Hypertension    Multiple sclerosis (HCC) sees Dr.   Vision abnormalities     PAST SURGICAL HISTORY: Past Surgical History:  Procedure Laterality Date   ACHILLES TENDON SURGERY     ANTERIOR CRUCIATE LIGAMENT REPAIR     MENISCUS REPAIR      FAMILY HISTORY: Family History  Problem Relation Age of Onset   Congestive Heart Failure Father    Stroke Maternal Grandfather    Multiple sclerosis Sister     SOCIAL HISTORY:  Social History   Socioeconomic History   Marital status: Divorced    Spouse name: Not on file   Number of children: Not on file   Years of education: Not on file   Highest education level: Not on file  Occupational History  Not on file  Tobacco Use   Smoking status: Never   Smokeless tobacco: Never  Vaping Use   Vaping Use: Never used  Substance and Sexual Activity   Alcohol use: No   Drug use: No   Sexual activity: Not on file  Other Topics Concern   Not on file  Social History Narrative   Not on file   Social Determinants of Health   Financial Resource Strain: Low Risk  (06/06/2021)   Overall Financial Resource Strain (CARDIA)    Difficulty of Paying Living Expenses: Not hard at all  Food Insecurity: No Food Insecurity (06/06/2021)   Hunger Vital Sign    Worried About Running Out of Food in the Last Year: Never true    Ran Out of Food in the Last Year: Never true  Transportation Needs: No Transportation Needs (06/06/2021)   PRAPARE - Administrator, Civil Service (Medical): No    Lack of Transportation (Non-Medical): No  Physical Activity: Inactive (06/06/2021)   Exercise Vital Sign    Days of Exercise per Week: 0 days    Minutes of Exercise per Session: 0 min  Stress: No Stress Concern Present (06/06/2021)   [employer] of Occupational Health - Occupational Stress Questionnaire    Feeling of Stress : Not at all  Social Connections: Socially Isolated (06/06/2021)   Social  Connection and Isolation Panel [NHANES]    Frequency of Communication with Friends and Family: Twice a week    Frequency of Social Gatherings with Friends and Family: Twice a week    Attends Religious Services: Never    Database administrator or Organizations: No    Attends Banker Meetings: Never    Marital Status: Divorced  Catering manager Violence: Not At Risk (06/06/2021)   Humiliation, Afraid, Rape, and Kick questionnaire    Fear of Current or Ex-Partner: No    Emotionally Abused: No    Physically Abused: No    Sexually Abused: No     PHYSICAL EXAM  Vitals:   04/17/22 1047  BP: (!) 132/96  Pulse: 82  Weight: 170 lb (77.1 kg)  Height: 5\' 9"  (1.753 m)    Body mass index is 25.1 kg/m.   General: The patient is well-developed and well-nourished and in no acute distress.      Neurologic Exam  Mental status: The patient is alert and oriented x 3 at the time of the examination. The patient has apparent normal recent and remote memory, with an apparently normal attention span and concentration ability.   Speech is normal.  Cranial nerves: Extraocular movements exam shows nystagmus on right gaze c/w INO.  He has reduced color vision and acuity out of the right eye.. Facial strength and sensation and trapezius strength were normal.  Hearing appeared to be normal and symmetric.  Motor:  Muscle bulk is normal.  Muscle tone is increased in the legs, left greater than right and in the left arm.   Right arm has normal tone.   He has 4+/5 strength in the left leg and 5-/5 strength in the right leg.  Reduced RAM in left ar and both legs (worse on left)  Sensory: He has reduced sensation to vibration in the left leg.  Touch sensation was more symmetric.  Coordination: Finger-nose-finger is performed slowly on the left with mild tremor.  Heel-to-shin is performed poorly on the right and he cannot do on the left.  Gait and station: Station is normal.  The gait is wide and  spastic.  He uses a walker.  He has a left foot drop.  He cannot do tandem walk.  Romberg is positive.    Reflexes: Deep tendon reflexes are symmetric and increased in legs,with spread at both knees (worse on left) and nonsustained clonus at left ankle.          ASSESSMENT AND PLAN    1. Multiple sclerosis (HCC)   2. High risk medication use   3. Spastic gait   4. Spasticity   5. Weakness   6. History of optic neuritis   7. Vitamin D deficiency       1.   Continue Vumerity.  Check labs.  When he was hospitalized in January 2023, the lymphocyte count was low but it was usually 0.7 or higher previously with other tests while on Vumerity.  We will recheck today.  If the lymphocyte count is 0.6 or lower we may need to adjust the dose  2.   Continue to be active and exercise as tolerated.      Continue Vit D  3.   Renew Lexapro. 4.   He will return to see [place]  in 6 months for a regular visit or call sooner if there are new or worsening neurologic symptoms.     Pheng Prokop A. Epimenio Foot, MD, PhD, FAAN Certified in Neurology, Clinical Neurophysiology, Sleep Medicine, Pain Medicine and Neuroimaging Director, [facility] at [facility]  [facility] [address] [place], [place] [address] [phone]

## 2022-04-18 ENCOUNTER — Encounter: Payer: Self-pay | Admitting: Neurology

## 2022-04-18 ENCOUNTER — Other Ambulatory Visit: Payer: Self-pay | Admitting: Neurology

## 2022-04-18 DIAGNOSIS — G35 Multiple sclerosis: Secondary | ICD-10-CM

## 2022-04-18 DIAGNOSIS — Z79899 Other long term (current) drug therapy: Secondary | ICD-10-CM

## 2022-04-18 LAB — HEPATIC FUNCTION PANEL
ALT: 15 IU/L (ref 0–44)
AST: 13 IU/L (ref 0–40)
Albumin: 4.5 g/dL (ref 4.1–5.1)
Alkaline Phosphatase: 43 IU/L — ABNORMAL LOW (ref 44–121)
Bilirubin Total: 0.8 mg/dL (ref 0.0–1.2)
Bilirubin, Direct: 0.22 mg/dL (ref 0.00–0.40)
Total Protein: 6.6 g/dL (ref 6.0–8.5)

## 2022-04-18 LAB — CBC WITH DIFFERENTIAL/PLATELET
Basophils Absolute: 0.1 10*3/uL (ref 0.0–0.2)
Basos: 2 %
EOS (ABSOLUTE): 0.2 10*3/uL (ref 0.0–0.4)
Eos: 5 %
Hematocrit: 44 % (ref 37.5–51.0)
Hemoglobin: 15 g/dL (ref 13.0–17.7)
Immature Grans (Abs): 0 10*3/uL (ref 0.0–0.1)
Immature Granulocytes: 0 %
Lymphocytes Absolute: 0.6 10*3/uL — ABNORMAL LOW (ref 0.7–3.1)
Lymphs: 11 %
MCH: 29 pg (ref 26.6–33.0)
MCHC: 34.1 g/dL (ref 31.5–35.7)
MCV: 85 fL (ref 79–97)
Monocytes Absolute: 0.4 10*3/uL (ref 0.1–0.9)
Monocytes: 8 %
Neutrophils Absolute: 3.9 10*3/uL (ref 1.4–7.0)
Neutrophils: 74 %
Platelets: 165 10*3/uL (ref 150–450)
RBC: 5.17 x10E6/uL (ref 4.14–5.80)
RDW: 13.2 % (ref 11.6–15.4)
WBC: 5.3 10*3/uL (ref 3.4–10.8)

## 2022-04-26 ENCOUNTER — Telehealth: Payer: Self-pay | Admitting: *Deleted

## 2022-04-26 NOTE — Patient Outreach (Signed)
  Care Coordination   04/26/2022 Name: Alexander [name] MRN: [id number] DOB: [birth year]   Care Coordination Outreach Attempts:  An unsuccessful telephone outreach was attempted today to offer the patient information about available care coordination services as a benefit of their health plan.   Follow Up Plan:  Additional outreach attempts will be made to offer the patient care coordination information and services.   Encounter Outcome:  No Answer  Care Coordination Interventions Activated:  No   Care Coordination Interventions:  No, not indicated    Raina Mina, RN Care Management Coordinator [place] Office [phone]

## 2022-05-04 DIAGNOSIS — F32A Depression, unspecified: Secondary | ICD-10-CM | POA: Diagnosis not present

## 2022-05-04 DIAGNOSIS — F419 Anxiety disorder, unspecified: Secondary | ICD-10-CM | POA: Diagnosis not present

## 2022-05-04 DIAGNOSIS — Z9181 History of falling: Secondary | ICD-10-CM | POA: Diagnosis not present

## 2022-05-04 DIAGNOSIS — H539 Unspecified visual disturbance: Secondary | ICD-10-CM | POA: Diagnosis not present

## 2022-05-04 DIAGNOSIS — G35 Multiple sclerosis: Secondary | ICD-10-CM | POA: Diagnosis not present

## 2022-05-04 DIAGNOSIS — Z79899 Other long term (current) drug therapy: Secondary | ICD-10-CM | POA: Diagnosis not present

## 2022-05-06 ENCOUNTER — Encounter: Payer: Self-pay | Admitting: Neurology

## 2022-05-07 ENCOUNTER — Telehealth: Payer: Self-pay | Admitting: *Deleted

## 2022-05-07 NOTE — Patient Outreach (Signed)
  Care Coordination   05/07/2022 Name: Alexander [name] MRN: [id number] DOB: May 30, 1976   Care Coordination Outreach Attempts:  A second unsuccessful outreach was attempted today to offer the patient with information about available care coordination services as a benefit of their health plan.     Follow Up Plan:  Additional outreach attempts will be made to offer the patient care coordination information and services.   Encounter Outcome:  No Answer  Care Coordination Interventions Activated:  No   Care Coordination Interventions:  No, not indicated    Raina Mina, RN Care Management Coordinator [place] Office [phone]

## 2022-05-21 ENCOUNTER — Telehealth: Payer: Self-pay

## 2022-05-21 NOTE — Patient Outreach (Signed)
  Care Coordination   05/21/2022 Name: Alexander [name] MRN: [id number] DOB: 05/04/1976   Care Coordination Outreach Attempts:  A third unsuccessful outreach was attempted today to offer the patient with information about available care coordination services as a benefit of their health plan.   Follow Up Plan:  No further outreach attempts will be made at this time. We have been unable to contact the patient to offer or enroll patient in care coordination services  Encounter Outcome:  No Answer  Care Coordination Interventions Activated:  No   Care Coordination Interventions:  No, not indicated    [name] RN, BSN,CCM, [place] Management [phone]

## 2022-06-03 ENCOUNTER — Encounter: Payer: Self-pay | Admitting: Neurology

## 2022-06-03 ENCOUNTER — Encounter: Payer: Self-pay | Admitting: Family Medicine

## 2022-06-03 ENCOUNTER — Ambulatory Visit (INDEPENDENT_AMBULATORY_CARE_PROVIDER_SITE_OTHER): Payer: Medicare Other | Admitting: Family Medicine

## 2022-06-03 VITALS — BP 112/80 | HR 73 | Temp 97.8°F | Ht 69.0 in | Wt 170.0 lb

## 2022-06-03 DIAGNOSIS — F419 Anxiety disorder, unspecified: Secondary | ICD-10-CM | POA: Diagnosis not present

## 2022-06-03 DIAGNOSIS — Z79899 Other long term (current) drug therapy: Secondary | ICD-10-CM | POA: Diagnosis not present

## 2022-06-03 DIAGNOSIS — Z9181 History of falling: Secondary | ICD-10-CM | POA: Diagnosis not present

## 2022-06-03 DIAGNOSIS — H539 Unspecified visual disturbance: Secondary | ICD-10-CM | POA: Diagnosis not present

## 2022-06-03 DIAGNOSIS — R739 Hyperglycemia, unspecified: Secondary | ICD-10-CM

## 2022-06-03 DIAGNOSIS — G35 Multiple sclerosis: Secondary | ICD-10-CM

## 2022-06-03 DIAGNOSIS — R6882 Decreased libido: Secondary | ICD-10-CM

## 2022-06-03 DIAGNOSIS — F32A Depression, unspecified: Secondary | ICD-10-CM | POA: Diagnosis not present

## 2022-06-03 LAB — CBC WITH DIFFERENTIAL/PLATELET
Basophils Absolute: 0.1 10*3/uL (ref 0.0–0.1)
Basophils Relative: 2.7 % (ref 0.0–3.0)
Eosinophils Absolute: 0.3 10*3/uL (ref 0.0–0.7)
Eosinophils Relative: 7 % — ABNORMAL HIGH (ref 0.0–5.0)
HCT: 42.6 % (ref 39.0–52.0)
Hemoglobin: 14.5 g/dL (ref 13.0–17.0)
Lymphocytes Relative: 19.4 % (ref 12.0–46.0)
Lymphs Abs: 0.9 10*3/uL (ref 0.7–4.0)
MCHC: 34 g/dL (ref 30.0–36.0)
MCV: 84.7 fl (ref 78.0–100.0)
Monocytes Absolute: 0.4 10*3/uL (ref 0.1–1.0)
Monocytes Relative: 8.8 % (ref 3.0–12.0)
Neutro Abs: 2.8 10*3/uL (ref 1.4–7.7)
Neutrophils Relative %: 62.1 % (ref 43.0–77.0)
Platelets: 171 10*3/uL (ref 150.0–400.0)
RBC: 5.03 Mil/uL (ref 4.22–5.81)
RDW: 13 % (ref 11.5–15.5)
WBC: 4.5 10*3/uL (ref 4.0–10.5)

## 2022-06-03 LAB — LIPID PANEL
Cholesterol: 173 mg/dL (ref 0–200)
HDL: 44.3 mg/dL (ref >39.00)
LDL Cholesterol: 114 mg/dL — ABNORMAL HIGH (ref 0–99)
NonHDL: 129.05
Total CHOL/HDL Ratio: 4
Triglycerides: 76 mg/dL (ref 0.0–149.0)
VLDL: 15.2 mg/dL (ref 0.0–40.0)

## 2022-06-03 LAB — BASIC METABOLIC PANEL
BUN: 14 mg/dL (ref 6–23)
CO2: 29 mEq/L (ref 19–32)
Calcium: 9.4 mg/dL (ref 8.4–10.5)
Chloride: 104 mEq/L (ref 96–112)
Creatinine, Ser: 0.84 mg/dL (ref 0.40–1.50)
GFR: 103.56 mL/min (ref >60.00)
Glucose, Bld: 71 mg/dL (ref 70–99)
Potassium: 4.1 mEq/L (ref 3.5–5.1)
Sodium: 140 mEq/L (ref 135–145)

## 2022-06-03 LAB — HEPATIC FUNCTION PANEL
ALT: 18 U/L (ref 0–53)
AST: 14 U/L (ref 0–37)
Albumin: 4.4 g/dL (ref 3.5–5.2)
Alkaline Phosphatase: 35 U/L — ABNORMAL LOW (ref 39–117)
Bilirubin, Direct: 0.2 mg/dL (ref 0.0–0.3)
Total Bilirubin: 1 mg/dL (ref 0.2–1.2)
Total Protein: 6.6 g/dL (ref 6.0–8.3)

## 2022-06-03 LAB — HEMOGLOBIN A1C: Hgb A1c MFr Bld: 4.7 % (ref 4.6–6.5)

## 2022-06-03 LAB — TESTOSTERONE: Testosterone: 193.15 ng/dL — ABNORMAL LOW (ref 300.00–890.00)

## 2022-06-03 LAB — TSH: TSH: 1.91 u[IU]/mL (ref 0.35–5.50)

## 2022-06-03 NOTE — Progress Notes (Signed)
Subjective:    Patient ID: Alexander [name], male    DOB: [birth year], 46 y.o.   MRN: [id number]  HPI Here to follow up on issues. He is doing fairly well all in all. He sees Dr. Epimenio Foot regularly for the MS. This has been fairly stable since he started taking Vumerity. His weakness and spasticity are stable. His appetite is good. His BMs and urinations are regular. He uses a walker to get around his home.    Review of Systems  Constitutional: Negative.   HENT: Negative.    Eyes: Negative.   Respiratory: Negative.    Cardiovascular: Negative.   Gastrointestinal: Negative.   Genitourinary: Negative.   Musculoskeletal: Negative.   Skin: Negative.   Neurological:  Positive for tremors and weakness.  Psychiatric/Behavioral: Negative.         Objective:   Physical Exam Constitutional:      General: He is not in acute distress.    Appearance: He is well-developed. He is not diaphoretic.     Comments: In a wheelchair   HENT:     Head: Normocephalic and atraumatic.     Right Ear: External ear normal.     Left Ear: External ear normal.     Nose: Nose normal.     Mouth/Throat:     Pharynx: No oropharyngeal exudate.  Eyes:     General: No scleral icterus.       Right eye: No discharge.        Left eye: No discharge.     Conjunctiva/sclera: Conjunctivae normal.     Pupils: Pupils are equal, round, and reactive to light.  Neck:     Thyroid: No thyromegaly.     Vascular: No JVD.     Trachea: No tracheal deviation.  Cardiovascular:     Rate and Rhythm: Normal rate and regular rhythm.     Heart sounds: Normal heart sounds. No murmur heard.    No friction rub. No gallop.  Pulmonary:     Effort: Pulmonary effort is normal. No respiratory distress.     Breath sounds: Normal breath sounds. No wheezing or rales.  Chest:     Chest wall: No tenderness.  Abdominal:     General: Bowel sounds are normal. There is no distension.     Palpations: Abdomen is soft. There is no mass.      Tenderness: There is no abdominal tenderness. There is no guarding or rebound.  Genitourinary:    Penis: Normal. No tenderness.      Testes: Normal.  Musculoskeletal:        General: No tenderness. Normal range of motion.     Cervical back: Neck supple.  Lymphadenopathy:     Cervical: No cervical adenopathy.  Skin:    General: Skin is warm and dry.     Coloration: Skin is not pale.     Findings: No erythema or rash.  Neurological:     Mental Status: He is alert and oriented to person, place, and time.     Cranial Nerves: No cranial nerve deficit.     Motor: No abnormal muscle tone.     Coordination: Coordination normal.     Deep Tendon Reflexes: Reflexes are normal and symmetric. Reflexes normal.  Psychiatric:        Behavior: Behavior normal.        Thought Content: Thought content normal.        Judgment: Judgment normal.  Assessment & Plan:   He seems to be doing well overall. His MS seems to be stable. His moods are stable. We will get fasting labs to check lipids, etc. He declines a flu shot.  Alysia Penna, MD

## 2022-06-04 ENCOUNTER — Encounter: Payer: Self-pay | Admitting: Family Medicine

## 2022-06-04 NOTE — Telephone Encounter (Signed)
The low alkaline phosphatase is nothing to worry about. Sometimes a high level can indicate a problem. The eosinophil counts can be a little elevated by any allergies that are affecting the body. Again nothing to worry about

## 2022-06-10 ENCOUNTER — Telehealth: Payer: Self-pay | Admitting: Family Medicine

## 2022-06-10 NOTE — Telephone Encounter (Signed)
Left message for patient to call back and schedule Medicare Annual Wellness Visit (AWV) either virtually or in office. Left  my jabber number [phone]   Last AWV  06/06/21 please schedule with Nurse Health Adviser   45 min for awv-i and in office appointments 30 min for awv-s  phone/virtual appointments  

## 2022-06-11 ENCOUNTER — Ambulatory Visit (INDEPENDENT_AMBULATORY_CARE_PROVIDER_SITE_OTHER): Payer: Medicare Other

## 2022-06-11 VITALS — Ht 69.0 in | Wt 170.0 lb

## 2022-06-11 DIAGNOSIS — Z0000 Encounter for general adult medical examination without abnormal findings: Secondary | ICD-10-CM

## 2022-06-11 NOTE — Progress Notes (Signed)
Subjective:   Alexander [name] is a 46 y.o. male who presents for Medicare Annual/Subsequent preventive examination.  Review of Systems    Virtual Visit via Telephone Note  I connected with  [name] on 06/11/22 at  2:30 PM EST by telephone and verified that I am speaking with the correct person using two identifiers.  Location: Patient: Home Provider: Office Persons participating in the virtual visit: patient/Nurse Health Advisor   I discussed the limitations, risks, security and privacy concerns of performing an evaluation and management service by telephone and the availability of in person appointments. The patient expressed understanding and agreed to proceed.  Interactive audio and video telecommunications were attempted between this nurse and patient, however failed, due to patient having technical difficulties OR patient did not have access to video capability.  We continued and completed visit with audio only.  Some vital signs may be absent or patient reported.   Criselda Peaches, LPN  Cardiac Risk Factors include: advanced age (>12men, >104 women);male gender;Other (see comment), Risk factor comments: DX Multiple Sclerosis     Objective:    Today's Vitals   06/11/22 1441  Weight: 170 lb (77.1 kg)  Height: 5\' 9"  (1.753 m)   Body mass index is 25.1 kg/m.     06/11/2022    3:01 PM 08/10/2021    4:35 PM 08/09/2021    7:18 AM 06/06/2021    1:52 PM 06/05/2020    2:12 PM  Advanced Directives  Does Patient Have a Medical Advance Directive? Yes No No Yes No  Type of Paramedic of [place];Living will   [place];Living will   Copy of [place] in Chart? No - copy requested   No - copy requested   Would patient like information on creating a medical advance directive?  No - Patient declined No - Patient declined  No - Patient declined    Current Medications (verified) Outpatient Encounter Medications as of  06/11/2022  Medication Sig   Ascorbic Acid (VITA-C PO) Take 1,000 mg by mouth daily.   baclofen (LIORESAL) 10 MG tablet Take 10mg  three times daily as needed   Cholecalciferol (DIALYVITE VITAMIN D 5000) 125 MCG (5000 UT) capsule Take 5,000 Units by mouth daily.   Cyanocobalamin (B-12 PO) Take 1 Dose by mouth every other day.   Diroximel Fumarate (VUMERITY) 231 MG CPDR Take 462 mg by mouth in the morning and at bedtime.   escitalopram (LEXAPRO) 20 MG tablet Take 1 tablet (20 mg total) by mouth daily.   MAGNESIUM PO Take 500 mg by mouth daily.   Misc Natural Products (NF FORMULAS TESTOSTERONE) CAPS Take 165 mg by mouth daily.   Multiple Vitamins-Minerals (ZINC PO) Take 1 Dose by mouth every other day.   Nutritional Supplements (JUICE PLUS FIBRE PO) Take 1 capsule by mouth daily.   Omega-3 Fatty Acids (FISH OIL) 1000 MG CAPS Take 1,200 mg by mouth daily.   Probiotic Product (PROBIOTIC PO) Take 3 capsules by mouth daily. Nature Made   Turmeric 500 MG TABS Take 1 Dose by mouth every other day.   UNABLE TO FIND Take 500-1,000 mg by mouth daily. CBD oil   UNABLE TO FIND Take 21 g by mouth. Med Name: Vegan protein powder   UNABLE TO FIND Med Name: [employer] mushroom powder   No facility-administered encounter medications on file as of 06/11/2022.    Allergies (verified) Patient has no known allergies.   History: Past Medical  History:  Diagnosis Date   Hypertension    Multiple sclerosis ([place]) sees Dr. Arlice Colt   Vision abnormalities    Past Surgical History:  Procedure Laterality Date   ACHILLES TENDON SURGERY     ANTERIOR CRUCIATE LIGAMENT REPAIR     MENISCUS REPAIR     Family History  Problem Relation Age of Onset   Congestive Heart Failure Father    Stroke Maternal Grandfather    Multiple sclerosis Sister    Social History   Socioeconomic History   Marital status: Divorced    Spouse name: Not on file   Number of children: Not on file   Years of education: Not on file    Highest education level: Not on file  Occupational History   Not on file  Tobacco Use   Smoking status: Never   Smokeless tobacco: Never  Vaping Use   Vaping Use: Never used  Substance and Sexual Activity   Alcohol use: No   Drug use: No   Sexual activity: Not on file  Other Topics Concern   Not on file  Social History Narrative   Not on file   Social Determinants of Health   Financial Resource Strain: Low Risk  (06/11/2022)   Overall Financial Resource Strain (CARDIA)    Difficulty of Paying Living Expenses: Not hard at all  Food Insecurity: No Food Insecurity (06/11/2022)   Hunger Vital Sign    Worried About Running Out of Food in the Last Year: Never true    [place] in the Last Year: Never true  Transportation Needs: No Transportation Needs (06/11/2022)   PRAPARE - Hydrologist (Medical): No    Lack of Transportation (Non-Medical): No  Physical Activity: Insufficiently Active (06/11/2022)   Exercise Vital Sign    Days of Exercise per Week: 1 day    Minutes of Exercise per Session: 60 min  Stress: No Stress Concern Present (06/11/2022)   [place]    Feeling of Stress : Not at all  Social Connections: Socially Isolated (06/11/2022)   Social Connection and Isolation Panel [NHANES]    Frequency of Communication with Friends and Family: More than three times a week    Frequency of Social Gatherings with Friends and Family: More than three times a week    Attends Religious Services: Never    Marine scientist or Organizations: No    Attends Music therapist: Never    Marital Status: Divorced    Tobacco Counseling Counseling given: Not Answered   Clinical Intake:  Pre-visit preparation completed: No  Pain : No/denies pain     BMI - recorded: 25.1 Nutritional Status: BMI 25 -29 Overweight Nutritional Risks: None Diabetes: No  How often do  you need to have someone help you when you read instructions, pamphlets, or other written materials from your doctor or pharmacy?: 1 - Never  Diabetic?  No  Interpreter Needed?: No  Information entered by :: Rolene Arbour LPN   Activities of Daily Living    06/11/2022    2:50 PM 08/10/2021    5:00 PM  In your present state of health, do you have any difficulty performing the following activities:  Hearing? 0 0  Vision? 0 1  Difficulty concentrating or making decisions? 0 0  Walking or climbing stairs? 1 1  Comment Dx Mulitable Sclerosis   Dressing or bathing? 0 1  Doing errands, shopping?  [place] and eating ? Y   Comment Mother assist   Using the Toilet? N   In the past six months, have you accidently leaked urine? Y   Comment Followed by Neurologist and PCP   Do you have problems with loss of bowel control? N   Managing your Medications? N   Managing your Finances? N   Housekeeping or managing your Housekeeping? Y   Comment Mother assist     Patient Care Team: Laurey Morale, MD as PCP - General (Family Medicine)  Indicate any recent Medical Services you may have received from other than [facility] providers in the past year (date may be approximate).     Assessment:   This is a routine wellness examination for [place].  Hearing/Vision screen Hearing Screening - Comments:: Denies hearing difficulties   Vision Screening - Comments:: Wears rx glasses - up to date with routine eye exams with  Dr Clydene Laming  Dietary issues and exercise activities discussed: Current Exercise Habits: Home exercise routine, Type of exercise: stretching;Other - see comments (PT exercises), Time (Minutes): 60, Frequency (Times/Week): 1, Weekly Exercise (Minutes/Week): 60, Intensity: Moderate, Exercise limited by: Other - see comments (Dx Multiple Sclerosis)   Goals Addressed               This Visit's Progress     Patient Stated (pt-stated)        I would like  to be able to walk without walker and drive again.        Depression Screen    06/11/2022    2:47 PM 06/03/2022    9:44 AM 06/06/2021    1:54 PM 06/06/2021    1:51 PM 06/05/2020    2:17 PM 04/29/2016    2:09 PM  PHQ 2/9 Scores  PHQ - 2 Score 0 2 0 0 0 0  PHQ- 9 Score 0 5   0     Fall Risk    06/11/2022    2:58 PM 06/03/2022    9:44 AM 06/06/2021    1:53 PM 04/10/2021   10:08 AM 06/05/2020    2:14 PM  Fall Risk   Falls in the past year? 1 1 1 1 1   Number falls in past yr: 1 0 0 1 1  Injury with Fall? 0 0 0 1 1  Comment No injury or medical attention needed  uses walker DX:  MS  hit head with one fall  Risk for fall due to : Impaired mobility Impaired balance/gait Impaired balance/gait  Impaired mobility;Impaired balance/gait;Impaired vision  Follow up Falls prevention discussed Falls evaluation completed Falls evaluation completed  Falls evaluation completed;Falls prevention discussed    FALL RISK PREVENTION PERTAINING TO THE HOME:  Any stairs in or around the home? No  If so, are there any without handrails? No  Home free of loose throw rugs in walkways, pet beds, electrical cords, etc? Yes  Adequate lighting in your home to reduce risk of falls? Yes   ASSISTIVE DEVICES UTILIZED TO PREVENT FALLS:  Life alert? No fry Use of a cane, walker or w/c? Yes  Grab bars in the bathroom? Yes  Shower chair or bench in shower? Yes  Elevated toilet seat or a handicapped toilet? No   TIMED UP AND GO:  Was the test performed? No . Audio Visit  Cognitive Function:        06/11/2022    3:01 PM  6CIT Screen  What Year?  0 points  What month? 0 points  What time? 0 points  Count back from 20 0 points  Months in reverse 0 points  Repeat phrase 0 points  Total Score 0 points    Immunizations Immunization History  Administered Date(s) Administered   Influenza,inj,Quad PF,6+ Mos 04/29/2016, 04/30/2017, 05/05/2018, 05/10/2019, 05/12/2020   Influenza-Unspecified 04/11/2018              Screening Tests Health Maintenance  Topic Date Due   INFLUENZA VACCINE  12/03/2022 (Originally 03/05/2022)   TETANUS/TDAP  06/04/2023 (Originally 07/09/1993)   COLONOSCOPY (Pts 45-36yrs Insurance coverage will need to be confirmed)  07/04/2025 (Originally 07/10/2019)   Medicare Annual Wellness (AWV)  06/12/2023   Hepatitis C Screening  Completed   HIV Screening  Completed   HPV VACCINES  Aged Out    Health Maintenance  There are no preventive care reminders to display for this patient.     Lung Cancer Screening: (Low Dose CT Chest recommended if Age 44-80 years, 30 pack-year currently smoking OR have quit w/in 15years.) does not qualify.     Additional Screening:  Hepatitis C Screening: does qualify; Completed 02/10/22  Vision Screening: Recommended annual ophthalmology exams for early detection of glaucoma and other disorders of the eye. Is the patient up to date with their annual eye exam?  Yes  Who is the provider or what is the name of the office in which the patient attends annual eye exams? Dr Clydene Laming If pt is not established with a provider, would they like to be referred to a provider to establish care? No .   Dental Screening: Recommended annual dental exams for proper oral hygiene  Community Resource Referral / Chronic Care Management: CRR required this visit?  No   CCM required this visit?  No      Plan:     I have personally reviewed and noted the following in the patient's chart:   Medical and social history Use of alcohol, tobacco or illicit drugs  Current medications and supplements including opioid prescriptions. Patient is not currently taking opioid prescriptions. Functional ability and status Nutritional status Physical activity Advanced directives List of other physicians Hospitalizations, surgeries, and ER visits in previous 12 months Vitals Screenings to include cognitive, depression, and falls Referrals and  appointments  In addition, I have reviewed and discussed with patient certain preventive protocols, quality metrics, and best practice recommendations. A written personalized care plan for preventive services as well as general preventive health recommendations were provided to patient.     Criselda Peaches, LPN   77/11/1285   Nurse Notes: None

## 2022-06-11 NOTE — Patient Instructions (Addendum)
Alexander [name] , Thank you for taking time to come for your Medicare Wellness Visit. I appreciate your ongoing commitment to your health goals. Please review the following plan we discussed and let me know if I can assist you in the future.   These are the goals we discussed:  Goals       Patient Stated (pt-stated)      I would like to be able to walk without walker and drive again.       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        This is a list of the screening recommended for you and due dates:  Health Maintenance  Topic Date Due   Flu Shot  12/03/2022*   Tetanus Vaccine  06/04/2023*   Colon Cancer Screening  07/04/2025*   Medicare Annual Wellness Visit  06/12/2023   Hepatitis C Screening: USPSTF Recommendation to screen - Ages 18-79 yo.  Completed   HIV Screening  Completed   HPV Vaccine  Aged Out  *Topic was postponed. The date shown is not the original due date.    Advanced directives: Please bring a copy of your health care power of attorney and living will to the office to be added to your chart at your convenience.   Conditions/risks identified: None  Next appointment: Follow up in one year for your annual wellness visit   Preventive Care 40-64 Years, Male Preventive care refers to lifestyle choices and visits with your health care provider that can promote health and wellness. What does preventive care include? A yearly physical exam. This is also called an annual well check. Dental exams once or twice a year. Routine eye exams. Ask your health care provider how often you should have your eyes checked. Personal lifestyle choices, including: Daily care of your teeth and gums. Regular physical activity. Eating a healthy diet. Avoiding tobacco and drug use. Limiting alcohol use. Practicing safe sex. Taking low-dose aspirin every day starting at age 44. What happens during an annual well check? The services and screenings done by  your health care provider during your annual well check will depend on your age, overall health, lifestyle risk factors, and family history of disease. Counseling  Your health care provider may ask you questions about your: Alcohol use. Tobacco use. Drug use. Emotional well-being. Home and relationship well-being. Sexual activity. Eating habits. Work and work Statistician. Screening  You may have the following tests or measurements: Height, weight, and BMI. Blood pressure. Lipid and cholesterol levels. These may be checked every 5 years, or more frequently if you are over 18 years old. Skin check. Lung cancer screening. You may have this screening every year starting at age 39 if you have a 30-pack-year history of smoking and currently smoke or have quit within the past 15 years. Fecal occult blood test (FOBT) of the stool. You may have this test every year starting at age 44. Flexible sigmoidoscopy or colonoscopy. You may have a sigmoidoscopy every 5 years or a colonoscopy every 10 years starting at age 39. Prostate cancer screening. Recommendations will vary depending on your family history and other risks. Hepatitis C blood test. Hepatitis B blood test. Sexually transmitted disease (STD) testing. Diabetes screening. This is done by checking your blood sugar (glucose) after you have not eaten for a while (fasting). You may have this done every 1-3 years. Discuss your test results, treatment options, and if necessary,  the need for more tests with your health care provider. Vaccines  Your health care provider may recommend certain vaccines, such as: Influenza vaccine. This is recommended every year. Tetanus, diphtheria, and acellular pertussis (Tdap, Td) vaccine. You may need a Td booster every 10 years. Zoster vaccine. You may need this after age 65. Pneumococcal 13-valent conjugate (PCV13) vaccine. You may need this if you have certain conditions and have not been  vaccinated. Pneumococcal polysaccharide (PPSV23) vaccine. You may need one or two doses if you smoke cigarettes or if you have certain conditions. Talk to your health care provider about which screenings and vaccines you need and how often you need them. This information is not intended to replace advice given to you by your health care provider. Make sure you discuss any questions you have with your health care provider. Document Released: 08/18/2015 Document Revised: 04/10/2016 Document Reviewed: 05/23/2015 Elsevier Interactive Patient Education  2017 [place] Prevention in the Home Falls can cause injuries. They can happen to people of all ages. There are many things you can do to make your home safe and to help prevent falls. What can I do on the outside of my home? Regularly fix the edges of walkways and driveways and fix any cracks. Remove anything that might make you trip as you walk through a door, such as a raised step or threshold. Trim any bushes or trees on the path to your home. Use bright outdoor lighting. Clear any walking paths of anything that might make someone trip, such as rocks or tools. Regularly check to see if handrails are loose or broken. Make sure that both sides of any steps have handrails. Any raised decks and porches should have guardrails on the edges. Have any leaves, snow, or ice cleared regularly. Use sand or salt on walking paths during winter. Clean up any spills in your garage right away. This includes oil or grease spills. What can I do in the bathroom? Use night lights. Install grab bars by the toilet and in the tub and shower. Do not use towel bars as grab bars. Use non-skid mats or decals in the tub or shower. If you need to sit down in the shower, use a plastic, non-slip stool. Keep the floor dry. Clean up any water that spills on the floor as soon as it happens. Remove soap buildup in the tub or shower regularly. Attach bath mats  securely with double-sided non-slip rug tape. Do not have throw rugs and other things on the floor that can make you trip. What can I do in the bedroom? Use night lights. Make sure that you have a light by your bed that is easy to reach. Do not use any sheets or blankets that are too big for your bed. They should not hang down onto the floor. Have a firm chair that has side arms. You can use this for support while you get dressed. Do not have throw rugs and other things on the floor that can make you trip. What can I do in the kitchen? Clean up any spills right away. Avoid walking on wet floors. Keep items that you use a lot in easy-to-reach places. If you need to reach something above you, use a strong step stool that has a grab bar. Keep electrical cords out of the way. Do not use floor polish or wax that makes floors slippery. If you must use wax, use non-skid floor wax. Do not have throw rugs and other  things on the floor that can make you trip. What can I do with my stairs? Do not leave any items on the stairs. Make sure that there are handrails on both sides of the stairs and use them. Fix handrails that are broken or loose. Make sure that handrails are as long as the stairways. Check any carpeting to make sure that it is firmly attached to the stairs. Fix any carpet that is loose or worn. Avoid having throw rugs at the top or bottom of the stairs. If you do have throw rugs, attach them to the floor with carpet tape. Make sure that you have a light switch at the top of the stairs and the bottom of the stairs. If you do not have them, ask someone to add them for you. What else can I do to help prevent falls? Wear shoes that: Do not have high heels. Have rubber bottoms. Are comfortable and fit you well. Are closed at the toe. Do not wear sandals. If you use a stepladder: Make sure that it is fully opened. Do not climb a closed stepladder. Make sure that both sides of the stepladder  are locked into place. Ask someone to hold it for you, if possible. Clearly mark and make sure that you can see: Any grab bars or handrails. First and last steps. Where the edge of each step is. Use tools that help you move around (mobility aids) if they are needed. These include: Canes. Walkers. Scooters. Crutches. Turn on the lights when you go into a dark area. Replace any light bulbs as soon as they burn out. Set up your furniture so you have a clear path. Avoid moving your furniture around. If any of your floors are uneven, fix them. If there are any pets around you, be aware of where they are. Review your medicines with your doctor. Some medicines can make you feel dizzy. This can increase your chance of falling. Ask your doctor what other things that you can do to help prevent falls. This information is not intended to replace advice given to you by your health care provider. Make sure you discuss any questions you have with your health care provider. Document Released: 05/18/2009 Document Revised: 12/28/2015 Document Reviewed: 08/26/2014 Elsevier Interactive Patient Education  2017 [employer].

## 2022-06-23 ENCOUNTER — Encounter: Payer: Self-pay | Admitting: Neurology

## 2022-06-24 ENCOUNTER — Encounter: Payer: Self-pay | Admitting: Neurology

## 2022-07-03 DIAGNOSIS — Z9181 History of falling: Secondary | ICD-10-CM | POA: Diagnosis not present

## 2022-07-03 DIAGNOSIS — Z79899 Other long term (current) drug therapy: Secondary | ICD-10-CM | POA: Diagnosis not present

## 2022-07-03 DIAGNOSIS — G35 Multiple sclerosis: Secondary | ICD-10-CM | POA: Diagnosis not present

## 2022-07-03 DIAGNOSIS — F32A Depression, unspecified: Secondary | ICD-10-CM | POA: Diagnosis not present

## 2022-07-03 DIAGNOSIS — F419 Anxiety disorder, unspecified: Secondary | ICD-10-CM | POA: Diagnosis not present

## 2022-07-03 DIAGNOSIS — H539 Unspecified visual disturbance: Secondary | ICD-10-CM | POA: Diagnosis not present

## 2022-07-09 ENCOUNTER — Encounter: Payer: Self-pay | Admitting: Neurology

## 2022-07-16 NOTE — Telephone Encounter (Signed)
Faxed signed rx back to [facility] at [phone]. Received fax confirmation.

## 2022-07-31 ENCOUNTER — Encounter: Payer: Self-pay | Admitting: Neurology

## 2022-08-03 ENCOUNTER — Other Ambulatory Visit: Payer: Self-pay | Admitting: Neurology

## 2022-08-03 DIAGNOSIS — G35 Multiple sclerosis: Secondary | ICD-10-CM

## 2022-08-06 ENCOUNTER — Encounter (HOSPITAL_COMMUNITY): Payer: Self-pay

## 2022-08-06 ENCOUNTER — Encounter: Payer: Self-pay | Admitting: Neurology

## 2022-08-06 ENCOUNTER — Emergency Department (HOSPITAL_COMMUNITY)
Admission: EM | Admit: 2022-08-06 | Discharge: 2022-08-06 | Disposition: A | Discharge status: Home / Self Care | Payer: Medicare Other | Attending: Emergency Medicine | Admitting: Emergency Medicine

## 2022-08-06 ENCOUNTER — Other Ambulatory Visit: Payer: Self-pay

## 2022-08-06 DIAGNOSIS — K5900 Constipation, unspecified: Secondary | ICD-10-CM | POA: Insufficient documentation

## 2022-08-06 DIAGNOSIS — R109 Unspecified abdominal pain: Secondary | ICD-10-CM | POA: Diagnosis not present

## 2022-08-06 NOTE — ED Notes (Signed)
Pt assisted to bathroom, was able to have BM. Requesting to be discharged. EDP in to speak with pt.

## 2022-08-06 NOTE — ED Triage Notes (Signed)
Patient reports that he has not had a BM in 4 days. Patient reports a history of MS.

## 2022-08-06 NOTE — ED Provider Notes (Signed)
[facility] DEPT Provider Note   CSN: [id number] Arrival date & time: 08/06/22  6962     History  Chief Complaint  Patient presents with   Constipation    Krishon Adkison is a 47 y.o. male.  With a history of MS who presents to the ED for evaluation of 4 days of constipation.  States he typically has bowel movements every other day.  Currently has mild abdominal pain which she describes as rectal pressure.  Took 2 Dulcolax yesterday for symptoms with no relief.  Patient did have a bowel movement while awaiting a room and states he feels much better.  Denies other symptoms including fevers, chills, dysuria.   Constipation      Home Medications Prior to Admission medications   Medication Sig Start Date End Date Taking? Authorizing Provider  Ascorbic Acid (VITA-C PO) Take 1,000 mg by mouth daily.    [provider]  baclofen (LIORESAL) 10 MG tablet Take 10mg  three times daily as needed 10/08/21   Lomax, Amy, NP  Cholecalciferol (DIALYVITE VITAMIN D 5000) 125 MCG (5000 UT) capsule Take 5,000 Units by mouth daily.    [provider]  Cyanocobalamin (B-12 PO) Take 1 Dose by mouth every other day.    [provider]  Diroximel Fumarate (VUMERITY) 231 MG CPDR Take 462 mg by mouth in the morning and at bedtime. 09/17/21   Sater, Nanine Means, MD  escitalopram (LEXAPRO) 20 MG tablet Take 1 tablet (20 mg total) by mouth daily. 04/17/22   Sater, Nanine Means, MD  MAGNESIUM PO Take 500 mg by mouth daily.    [provider]  Misc Natural Products (NF FORMULAS TESTOSTERONE) CAPS Take 165 mg by mouth daily.    [provider]  Multiple Vitamins-Minerals (ZINC PO) Take 1 Dose by mouth every other day.    [provider]  Nutritional Supplements (JUICE PLUS FIBRE PO) Take 1 capsule by mouth daily.    [provider]  Omega-3 Fatty Acids (FISH OIL) 1000 MG CAPS Take 1,200 mg by mouth daily.    [provider]   Probiotic Product (PROBIOTIC PO) Take 3 capsules by mouth daily. Nature Made    [provider]  Turmeric 500 MG TABS Take 1 Dose by mouth every other day.    [provider]  UNABLE TO FIND Take 500-1,000 mg by mouth daily. CBD oil    [provider]  UNABLE TO FIND Take 21 g by mouth. Med Name: Vegan protein powder    [provider]  UNABLE TO FIND Med Name: [facility] mushroom powder    [provider]      Allergies    Patient has no known allergies.    Review of Systems   Review of Systems  Gastrointestinal:  Positive for constipation.  All other systems reviewed and are negative.   Physical Exam Updated Vital Signs BP (!) 156/94 (BP Location: Left Arm)   Pulse (!) 103   Temp 98 F (36.7 C) (Oral)   Resp 16   Ht 5\' 9"  (1.753 m)   Wt 77.1 kg   SpO2 100%   BMI 25.10 kg/m  Physical Exam Vitals and nursing note reviewed.  Constitutional:      General: He is not in acute distress.    Appearance: Normal appearance. He is normal weight. He is not ill-appearing.  HENT:     Head: Normocephalic and atraumatic.  Pulmonary:     Effort: Pulmonary effort  is normal. No respiratory distress.  Abdominal:     General: Abdomen is flat.     Tenderness: There is no abdominal tenderness.  Musculoskeletal:        General: Normal range of motion.     Cervical back: Neck supple.  Skin:    General: Skin is warm and dry.  Neurological:     General: No focal deficit present.     Mental Status: He is alert and oriented to person, place, and time.  Psychiatric:        Mood and Affect: Mood normal.        Behavior: Behavior normal.     ED Results / Procedures / Treatments   Labs (all labs ordered are listed, but only abnormal results are displayed) Labs Reviewed - No data to display  EKG None  Radiology No results found.  Procedures Procedures    Medications Ordered in ED Medications - No data to display  ED Course/ Medical  Decision Making/ A&P                           Medical Decision Making This patient presents to the ED for concern of constipation, this involves an extensive number of treatment options  Co morbidities that complicate the patient evaluation   MS  Additional history obtained from: Nursing notes from this visit.  Afebrile, hemodynamically stable.  47 year old male presenting to the ED for evaluation of 4 days of constipation.  No other symptoms.  Has tried Dulcolax twice at home with minimal relief patient did have a full bowel movement while in the ED and without intervention.  States he is feeling much better.  He was educated on home therapies for constipation.  He was encouraged to follow-up with his primary care provider if it continues.  Stable at discharge.  At this time there does not appear to be any evidence of an acute emergency medical condition and the patient appears stable for discharge with appropriate outpatient follow up. Diagnosis was discussed with patient who verbalizes understanding of care plan and is agreeable to discharge. I have discussed return precautions with patient who verbalizes understanding. Patient encouraged to follow-up with their PCP within 1 week. All questions answered.  Note: Portions of this report may have been transcribed using voice recognition software. Every effort was made to ensure accuracy; however, inadvertent computerized transcription errors may still be present.         Final Clinical Impression(s) / ED Diagnoses Final diagnoses:  Constipation, unspecified constipation type    Rx / DC Orders ED Discharge Orders     None         Roylene Reason, Hershal Coria 08/06/22 0901    Cristie Hem, MD 08/06/22 1047

## 2022-08-06 NOTE — Discharge Instructions (Addendum)
You have been seen today for your complaint of constipation. Home care instructions are as follows:  Increase your fiber intake.  You may also use MiraLAX or enemas at home for constipation in the future Follow up with: Your primary care provider as needed Please seek immediate medical care if you develop any of the following symptoms: You have a fever and your symptoms suddenly get worse. You leak stool or have blood in your stool. Your abdomen is bloated. You have severe pain in your abdomen. You feel dizzy or you faint. At this time there does not appear to be the presence of an emergent medical condition, however there is always the potential for conditions to change. Please read and follow the below instructions.  Do not take your medicine if  develop an itchy rash, swelling in your mouth or lips, or difficulty breathing; call 911 and seek immediate emergency medical attention if this occurs.  You may review your lab tests and imaging results in their entirety on your MyChart account.  Please discuss all results of fully with your primary care provider and other specialist at your follow-up visit.  Note: Portions of this text may have been transcribed using voice recognition software. Every effort was made to ensure accuracy; however, inadvertent computerized transcription errors may still be present.

## 2022-08-06 NOTE — ED Provider Triage Note (Signed)
Emergency Medicine Provider Triage Evaluation Note  Alexander [name] , a 47 y.o. male  was evaluated in triage.  Pt complains of constipation.  Last bowel movement 4 days ago.  Typically has a bowel movement every other day.  Took 2 Dulcolax last night with no change in his symptoms.  Has minimal abdominal pain he states he has rectal pressure.  Has history of MS. Denies dysuria, fevers, chills, nausea, vomiting  Review of Systems  Positive: As above Negative: As above  Physical Exam  BP (!) 156/94 (BP Location: Left Arm)   Pulse (!) 103   Temp 98 F (36.7 C) (Oral)   Resp 16   Ht 5\' 9"  (1.753 m)   Wt 77.1 kg   SpO2 100%   BMI 25.10 kg/m  Gen:   Awake, no distress   Resp:  Normal effort  MSK:   Moves extremities without difficulty  Other:    Medical Decision Making  Medically screening exam initiated at 8:22 AM.  Appropriate orders placed.  Alexander [name] was informed that the remainder of the evaluation will be completed by another provider, this initial triage assessment does not replace that evaluation, and the importance of remaining in the ED until their evaluation is complete.  Patient was educated on home therapies including MiraLAX and increasing fiber intake.  Was also educated on the use of over-the-counter enemas at home.  He was insistent on being treated in the ED with an enema.    Alexander [name], [place] 08/06/22 [phone]

## 2022-08-07 ENCOUNTER — Other Ambulatory Visit: Payer: Self-pay | Admitting: *Deleted

## 2022-08-07 DIAGNOSIS — G35 Multiple sclerosis: Secondary | ICD-10-CM

## 2022-08-07 NOTE — Telephone Encounter (Signed)
6 month follow up scheduled 10/16/22 with Amy.

## 2022-08-07 NOTE — Telephone Encounter (Signed)
6 month follow scheduled on 10/16/2022

## 2022-08-08 ENCOUNTER — Telehealth: Payer: Self-pay

## 2022-08-08 NOTE — Telephone Encounter (Signed)
        Patient  visited [facility] on 1/2   Telephone encounter attempt :1st   A HIPAA compliant voice message was left requesting a return call.  Instructed patient to call back .    Dajana Gehrig [facility] Guide, [place], Care Management  [phone] [address], [place], [place] [address] Phone: [phone] Email: [email or web address]       

## 2022-08-09 ENCOUNTER — Telehealth: Payer: Self-pay

## 2022-08-09 ENCOUNTER — Telehealth: Payer: Self-pay | Admitting: Neurology

## 2022-08-09 NOTE — Telephone Encounter (Signed)
[employer] medicare Josem Kaufmann: [id number] exp. 08/09/22-09/07/22 sent to GI [phone]

## 2022-08-09 NOTE — Telephone Encounter (Signed)
        Patient  visited [facility] on 1/2   Telephone encounter attempt : 2nd   A HIPAA compliant voice message was left requesting a return call.  Instructed patient to call back .    Sherae Santino [facility] Care Guide, [place], Care Management  [phone] [address], [place], [place] [address] Phone: [phone] Email: [email or web address]       

## 2022-08-15 ENCOUNTER — Telehealth: Payer: Self-pay | Admitting: Neurology

## 2022-08-15 ENCOUNTER — Telehealth: Payer: Self-pay | Admitting: *Deleted

## 2022-08-15 NOTE — Telephone Encounter (Signed)
Received [place] mutual disability form. Completed and given to Hilda Blades in MR to send for the pt

## 2022-08-15 NOTE — Telephone Encounter (Signed)
Pt [place] mutual form faxed on 08/15/22

## 2022-08-16 ENCOUNTER — Encounter: Payer: Self-pay | Admitting: Family Medicine

## 2022-08-16 ENCOUNTER — Ambulatory Visit (INDEPENDENT_AMBULATORY_CARE_PROVIDER_SITE_OTHER): Payer: BC Managed Care – PPO | Admitting: Family Medicine

## 2022-08-16 ENCOUNTER — Encounter: Payer: Self-pay | Admitting: Neurology

## 2022-08-16 VITALS — BP 148/92 | HR 105 | Temp 97.7°F | Ht 69.0 in | Wt 148.0 lb

## 2022-08-16 DIAGNOSIS — R638 Other symptoms and signs concerning food and fluid intake: Secondary | ICD-10-CM | POA: Diagnosis not present

## 2022-08-16 DIAGNOSIS — K5900 Constipation, unspecified: Secondary | ICD-10-CM

## 2022-08-16 DIAGNOSIS — R634 Abnormal weight loss: Secondary | ICD-10-CM

## 2022-08-16 DIAGNOSIS — G35 Multiple sclerosis: Secondary | ICD-10-CM

## 2022-08-16 MED ORDER — VUMERITY 231 MG PO CPDR: 1.0000 | DELAYED_RELEASE_CAPSULE | Freq: Two times a day (BID) | ORAL | 0 refills | Status: DC

## 2022-08-16 NOTE — Progress Notes (Signed)
   Subjective:    Patient ID: Alexander [name], male    DOB: 1976/05/12, 47 y.o.   MRN: [id number]  HPI Here to discuss constipation, and also to talk about weight loss. He had been averaging a BM every other day for several years, but over the past 4 weeks he has been passing a stool only one time per week. His last BM was this morning. He has not felt uncomfortable at all, no abdominal pain or pressure or bloating. No nausea, he says his appetite has not changed. Of note about a month or so ago he purposely changed his diet to see if he could help some of his MS symptoms. He cut out all gluten products, and of course this greatly decreased the amount of fiber in his diet. He typically does not drink much water. He and his neurologist, Dr. Kerman Passey, have been decreasing his dose of Vumerity in case this was causing any side effects. We did lab work last October at his physical and the A1c was fine at 4.7, TSH was normal at 1.91, and the albumen was normal at 4.4. The other issue is he has been losing weight and he is not sure why. He has not weighed himself at home for some time, but his clothing has felt looser. By our scale to day her has lost 20-30 pounds over the past year.    Review of Systems  Constitutional: Negative.   Respiratory: Negative.    Cardiovascular: Negative.   Gastrointestinal:  Positive for constipation. Negative for abdominal distention, abdominal pain, blood in stool, diarrhea, nausea and vomiting.  Genitourinary: Negative.        Objective:   Physical Exam Constitutional:      Appearance: He is not ill-appearing.     Comments: In his wheelchair   Cardiovascular:     Rate and Rhythm: Normal rate and regular rhythm.     Pulses: Normal pulses.     Heart sounds: Normal heart sounds.  Pulmonary:     Effort: Pulmonary effort is normal.     Breath sounds: Normal breath sounds.  Abdominal:     General: Abdomen is flat. Bowel sounds are normal. There is no distension.      Palpations: Abdomen is soft. There is no mass.     Tenderness: There is no abdominal tenderness. There is no guarding or rebound.     Hernia: No hernia is present.  Neurological:     Mental Status: He is alert.           Assessment & Plan:  For the constipation, this is likely the result of not drinking enough water and of reducing his fiber intake. He will try to increase his fluid intake, and he will get more fiber in the diet. I also suggested he try Miralax every day. For the weight loss, I think this is also the result of his dietary changes. We will check some vitamin levels today. He will also start weighing himself at home once a week, and he will relay these results to [place].  Alysia Penna, MD

## 2022-08-17 LAB — VITAMIN D 25 HYDROXY (VIT D DEFICIENCY, FRACTURES): Vit D, 25-Hydroxy: 88 ng/mL (ref 30–100)

## 2022-08-17 LAB — MAGNESIUM: Magnesium: 2 mg/dL (ref 1.5–2.5)

## 2022-08-17 LAB — VITAMIN B12: Vitamin B-12: 571 pg/mL (ref 200–1100)

## 2022-08-19 ENCOUNTER — Encounter: Payer: Self-pay | Admitting: Family Medicine

## 2022-08-19 ENCOUNTER — Other Ambulatory Visit: Payer: Self-pay | Admitting: *Deleted

## 2022-08-19 DIAGNOSIS — R634 Abnormal weight loss: Secondary | ICD-10-CM

## 2022-08-19 DIAGNOSIS — G35 Multiple sclerosis: Secondary | ICD-10-CM

## 2022-08-19 NOTE — Telephone Encounter (Signed)
The referral was done  

## 2022-08-19 NOTE — Telephone Encounter (Signed)
I usually refer to [facility] nutritionists. I can set this up if he wishes

## 2022-08-20 ENCOUNTER — Encounter: Payer: Self-pay | Admitting: Family Medicine

## 2022-08-20 DIAGNOSIS — G35 Multiple sclerosis: Secondary | ICD-10-CM

## 2022-08-20 LAB — VITAMIN B1: Vitamin B1 (Thiamine): 11 nmol/L (ref 8–30)

## 2022-08-21 NOTE — Telephone Encounter (Signed)
His B1 level is normal. I went ahead and put in orders for complete lab work. He can make a lab appt any time

## 2022-08-22 ENCOUNTER — Ambulatory Visit
Admission: RE | Admit: 2022-08-22 | Discharge: 2022-08-22 | Disposition: A | Discharge status: Home / Self Care | Payer: Medicare Other | Source: Ambulatory Visit | Attending: Neurology | Admitting: Neurology

## 2022-08-22 DIAGNOSIS — G35 Multiple sclerosis: Secondary | ICD-10-CM

## 2022-08-22 MED ORDER — GADOPICLENOL 0.5 MMOL/ML IV SOLN
7.5000 mL | Freq: Once | INTRAVENOUS | Status: AC | PRN
  Administered 2022-08-22: 7.5 mL via INTRAVENOUS

## 2022-08-23 ENCOUNTER — Encounter: Payer: Self-pay | Admitting: Neurology

## 2022-09-19 ENCOUNTER — Telehealth: Payer: Self-pay | Admitting: Pharmacy Technician

## 2022-09-19 NOTE — Telephone Encounter (Signed)
Patient Advocate Encounter   Received notification that prior authorization for VUMERITY 231MG delayed-release capsules is required.   PA submitted on 09/19/2022 Key [id number] Status is pending       Lyndel Safe, [place] Patient Advocate Specialist [facility] Patient Advocate Team Direct Number: [phone]  Fax: [phone]

## 2022-09-20 NOTE — Telephone Encounter (Signed)
Patient Advocate Encounter  Prior Authorization for VUMERITY 231MG delayed-release capsules has been approved.     Effective dates: 09/19/2022 through 09/20/2023      Lyndel Safe, [place] Patient Advocate Specialist [place] Patient Advocate Team Direct Number: [phone]  Fax: [phone]

## 2022-10-06 ENCOUNTER — Encounter: Payer: Self-pay | Admitting: Neurology

## 2022-10-08 NOTE — Telephone Encounter (Signed)
Received previous fax sent to [facility] Resent to [employer] at fax [phone] as requested. Received fax confirmation.

## 2022-10-14 NOTE — Progress Notes (Unsigned)
No chief complaint on file.    HISTORY OF PRESENT ILLNESS:  10/14/22 ALL: Alexander [name] is a 47 y.o. male here today for follow up for relapsing/active SPMS and evaluation for need of powered wheelchair. He continues Vumerity and tolerating well. Labs stable 05/2022. MRI brain and cervical imaging was stable 08/2022.   He reports doing   He had PT evaluation for need of power wheelchair. PT notes report   He continues to use his Rolator. Legs seem a little stronger. He is taking baclofen as needed. He has noted more coldness of his hands and feet over the past month.   He feels mood is stable. He continues escitalopram. He has been taking only as needed. Usually about 1-2 times a week. He does not feel he is having any significant concerns with anxiety. He is sleeping well. No changes in vision, bowel or bladder habits.    HISTORY (copied from Dr Garth Bigness previous note)  Alexander [name] is a 47 y.o. man with a relapsing/active form of secondary progressive multiple sclerosis diagnosed in 2003.      Update 04/17/2022: He is on Vumerity and tolerates it well.   He tolerates it very well.   No stomach upset or flushing (just once).    He has no exacerbations.      Gait and balance are off.  He uses a walker both inside and outside.  He can go  > 100 feet.   He has had a couple fall due to tripping and balance   Falls tend to be backwards.  He does worse in the heat.  Left arm is worse still but right hand seems to be catching up with more weakness.   Left > right legs have spasticity, worse at night. He takes baclofen which helps but makes him feel weaker.  He has poor coordination, left > right  Bladder is inconsistent, sometimes with urgency but no incontinence in last 6 months.  Vision is doing the same with sequela of right optic neuritis - blurry and reduced colors.    Alexander [name]      He feels fatigue is mostly stable.  He does worse in heat.  Alexander [name]   He still takes a nap daily.   He sleeps 6-8  hours at night.  Sometimes he is not refreshed in the morning.  He feels depression better than last year.  He is sometimes irritable.   Cognition is doing well.   He was hospitalized in January 2023 for Covid and felt a lot weaker.   After fluids and a couple days he was much better.   He did PT with benefit.   MRi report mentioned one additional midbrain lesion but I see no new lesions.   Multiple cervical spine lesions look stable.     MS history: In 2003 he had optic neuritis started on the left and then also on the right. He had an MRI of the brain performed at that time and was told that it was normal. He had follow-up MRIs every year or 2 for several more years but they continue to be normal. In 2012, he noticed worsening issues with gait. He had had some difficulty with the gait for the previous couple of years but due to orthopedic issues a neurologic cause was not looked into. An MRI of the cervical spine showed multiple plaques consistent with MS. Initially, he was placed on Copaxone. Although he tolerated Copaxone well he had some breakthrough disease with  a new spot developing in the brain. Therefore he was switched to Tysabri. He tolerated Tysabri well and his MS was well controlled. However, she was JCV antibody positive a decision was made to stop Tysabri after 2 years. Years ago he went on Gilenya. He has tolerated it well and he did better on Tysabri. He was seeing Dr. Dutch Quint at [facility] on [place]. He had Ocrevus August 2017.  He switched from [place] to Vumerity January 2021 due to continued progression and fatigue while on Ocrevus.   MRI images MRI of the brain 09/21/2019 showed multiple T2/FLAIR hyperintense foci, mostly in the infratentorial white matter in a pattern and configuration consistent with chronic demyelinating plaque associated with multiple sclerosis.  None of the foci enhances or appears to be acute.  Compared to the MRI dated 02/18/2018, there are  no new lesions.   MRI of the cervical spine 09/21/2019 showed patchy hyperintense signal within the spinal cord from C2-C6.  Another small focus is noted at C7-T1 to the right.  Compared to the MRI from 02/18/2018, there are no new lesions.  Additionally, there are multilevel degenerative changes, mostly mild.  No definite nerve root compression or spinal stenosis.    REVIEW OF SYSTEMS: Out of a complete 14 system review of symptoms, the patient complains only of the following symptoms, weakness, dysesthesias, imbalance, spasticity, depression and all other reviewed systems are negative.  ALLERGIES: No Known Allergies   HOME MEDICATIONS: Outpatient Medications Prior to Visit  Medication Sig Dispense Refill   Ascorbic Acid (VITA-C PO) Take 1,000 mg by mouth daily.     baclofen (LIORESAL) 10 MG tablet Take '10mg'$  three times daily as needed 270 tablet 3   Cholecalciferol (DIALYVITE VITAMIN D 5000) 125 MCG (5000 UT) capsule Take 5,000 Units by mouth daily.     Cyanocobalamin (B-12 PO) Take 1 Dose by mouth every other day.     Diroximel Fumarate (VUMERITY) 231 MG CPDR Take 1 capsule by mouth in the morning and at bedtime. 360 capsule 0   escitalopram (LEXAPRO) 20 MG tablet Take 1 tablet (20 mg total) by mouth daily. 90 tablet 3   MAGNESIUM PO Take 500 mg by mouth daily.     Misc Natural Products (NF FORMULAS TESTOSTERONE) CAPS Take 165 mg by mouth daily.     Multiple Vitamins-Minerals (ZINC PO) Take 1 Dose by mouth every other day.     Nutritional Supplements (JUICE PLUS FIBRE PO) Take 1 capsule by mouth daily.     Omega-3 Fatty Acids (FISH OIL) 1000 MG CAPS Take 1,200 mg by mouth daily.     Probiotic Product (PROBIOTIC PO) Take 3 capsules by mouth daily. Nature Made     Turmeric 500 MG TABS Take 1 Dose by mouth every other day.     UNABLE TO FIND Take 500-1,000 mg by mouth daily. CBD oil     UNABLE TO FIND Take 21 g by mouth. Med Name: Vegan protein powder     UNABLE TO FIND Med Name: [facility] mushroom powder     No facility-administered medications prior to visit.     PAST MEDICAL HISTORY: Past Medical History:  Diagnosis Date   Hypertension    Multiple sclerosis ([place]) sees Dr. Arlice Colt   Vision abnormalities      PAST SURGICAL HISTORY: Past Surgical History:  Procedure Laterality Date   ACHILLES TENDON SURGERY     ANTERIOR CRUCIATE LIGAMENT REPAIR     MENISCUS REPAIR  FAMILY HISTORY: Family History  Problem Relation Age of Onset   Congestive Heart Failure Father    Stroke Maternal Grandfather    Multiple sclerosis Sister      SOCIAL HISTORY: Social History   Socioeconomic History   Marital status: Divorced    Spouse name: Not on file   Number of children: Not on file   Years of education: Not on file   Highest education level: Bachelor's degree (e.g., BA, AB, BS)  Occupational History   Not on file  Tobacco Use   Smoking status: Never   Smokeless tobacco: Never  Vaping Use   Vaping Use: Never used  Substance and Sexual Activity   Alcohol use: No   Drug use: No   Sexual activity: Not on file  Other Topics Concern   Not on file  Social History Narrative   Not on file   Social Determinants of Health   Financial Resource Strain: Medium Risk (08/14/2022)   Overall Financial Resource Strain (CARDIA)    Difficulty of Paying Living Expenses: Somewhat hard  Food Insecurity: Food Insecurity Present (08/14/2022)   Hunger Vital Sign    Worried About Running Out of Food in the Last Year: Sometimes true    Ran Out of Food in the Last Year: Patient refused  Transportation Needs: No Transportation Needs (08/14/2022)   PRAPARE - Hydrologist (Medical): No    Lack of Transportation (Non-Medical): No  Physical Activity: Insufficiently Active (08/14/2022)   Exercise Vital Sign    Days of Exercise per Week: 3 days    Minutes of Exercise per Session: 30 min  Stress: No Stress Concern Present (08/14/2022)    [place]    Feeling of Stress : Only a little  Social Connections: Unknown (08/14/2022)   Social Connection and Isolation Panel [NHANES]    Frequency of Communication with Friends and Family: More than three times a week    Frequency of Social Gatherings with Friends and Family: Patient refused    Attends Religious Services: Patient refused    Marine scientist or Organizations: No    Attends Music therapist: Never    Marital Status: Divorced  Recent Concern: Social Connections - Socially Isolated (06/11/2022)   Social Connection and Isolation Panel [NHANES]    Frequency of Communication with Friends and Family: More than three times a week    Frequency of Social Gatherings with Friends and Family: More than three times a week    Attends Religious Services: Never    Marine scientist or Organizations: No    Attends Archivist Meetings: Never    Marital Status: Divorced  Human resources officer Violence: Not At Risk (06/11/2022)   Humiliation, Afraid, Rape, and Kick questionnaire    Fear of Current or Ex-Partner: No    Emotionally Abused: No    Physically Abused: No    Sexually Abused: No      PHYSICAL EXAM  There were no vitals filed for this visit.   There is no height or weight on file to calculate BMI.   Generalized: Well developed, in no acute distress  Cardiology: normal rate and rhythm, no murmur auscultated  Respiratory: clear to auscultation bilaterally    Neurological examination  Mentation: Alert oriented to time, place, history taking. Follows all commands speech and language fluent Cranial nerve II-XII: Pupils were equal round reactive to light. Extraocular movements were full, visual field  were full on confrontational test. Facial sensation and strength were normal. Head turning and shoulder shrug  were normal and symmetric. Motor: The motor testing reveals 5 over 5  strength of bilateral upper extremities, 3/5 bilateral lower hip flexion, 5/5 bilateral lower distal   extension, 3/5 flextion Sensory: Sensory testing is intact to soft touch on all 4 extremities. No evidence of extinction is noted.  Coordination: Cerebellar testing reveals slow finger-nose-finger, unable to perform HTS bilaterally  Gait and station: Gait is wide, slow stride but stable with Rolator.  Reflexes: Deep tendon reflexes are symmetric and brisk in bilateral lower extremities.     DIAGNOSTIC DATA (LABS, IMAGING, TESTING) - I reviewed patient records, labs, notes, testing and imaging myself where available.  Lab Results  Component Value Date   WBC 4.5 06/03/2022   HGB 14.5 06/03/2022   HCT 42.6 06/03/2022   MCV 84.7 06/03/2022   PLT 171.0 06/03/2022      Component Value Date/Time   NA 140 06/03/2022 0955   NA 142 02/10/2018 1438   K 4.1 06/03/2022 0955   CL 104 06/03/2022 0955   CO2 29 06/03/2022 0955   GLUCOSE 71 06/03/2022 0955   BUN 14 06/03/2022 0955   BUN 12 02/10/2018 1438   CREATININE 0.84 06/03/2022 0955   CREATININE 0.86 05/12/2020 1402   CALCIUM 9.4 06/03/2022 0955   PROT 6.6 06/03/2022 0955   PROT 6.6 04/17/2022 1136   ALBUMIN 4.4 06/03/2022 0955   ALBUMIN 4.5 04/17/2022 1136   AST 14 06/03/2022 0955   ALT 18 06/03/2022 0955   ALKPHOS 35 (L) 06/03/2022 0955   BILITOT 1.0 06/03/2022 0955   BILITOT 0.8 04/17/2022 1136   GFRNONAA >60 08/12/2021 0551   GFRAA 116 02/10/2018 1438   Lab Results  Component Value Date   CHOL 173 06/03/2022   HDL 44.30 06/03/2022   LDLCALC 114 (H) 06/03/2022   LDLDIRECT 164.0 05/05/2018   TRIG 76.0 06/03/2022   CHOLHDL 4 06/03/2022   Lab Results  Component Value Date   HGBA1C 4.7 06/03/2022   Lab Results  Component Value Date   [id number] 08/16/2022   Lab Results  Component Value Date   TSH 1.91 06/03/2022        No data to display           ASSESSMENT AND PLAN  47 y.o. year old male  has a  past medical history of Hypertension, Multiple sclerosis ([place]) (sees Dr. Arlice Colt), and Vision abnormalities. here with   No diagnosis found.  Richardson Landry is fairly stable currently. Labs and imaging stable. He will continue generic Vumerity. He will continue baclofen as needed for spasticity.  I will have him discontinue escitalopram as he is not taking regularly. He will continue working with PT. Fall precautions advised. Healthy lifestyle habits encouraged. He will follow-up in 6 months with Dr. Felecia Shelling.   No orders of the defined types were placed in this encounter.   I spent 30 minutes of face-to-face and non-face-to-face time with patient.  This included previsit chart review, lab review, study review, order entry, electronic health record documentation, patient education.    Debbora Presto, MSN, FNP-C 10/14/2022, 1:48 PM  [facility] [address] [place], [place] [address] [phone]

## 2022-10-16 ENCOUNTER — Ambulatory Visit: Payer: Medicare Other | Admitting: Family Medicine

## 2022-10-16 ENCOUNTER — Encounter: Payer: Self-pay | Admitting: Family Medicine

## 2022-10-16 VITALS — BP 149/90 | HR 79 | Ht 68.0 in | Wt 150.5 lb

## 2022-10-16 DIAGNOSIS — G35 Multiple sclerosis: Secondary | ICD-10-CM | POA: Diagnosis not present

## 2022-10-16 DIAGNOSIS — R531 Weakness: Secondary | ICD-10-CM

## 2022-10-16 DIAGNOSIS — Z8669 Personal history of other diseases of the nervous system and sense organs: Secondary | ICD-10-CM

## 2022-10-16 DIAGNOSIS — E559 Vitamin D deficiency, unspecified: Secondary | ICD-10-CM

## 2022-10-16 DIAGNOSIS — R252 Cramp and spasm: Secondary | ICD-10-CM

## 2022-10-16 DIAGNOSIS — R261 Paralytic gait: Secondary | ICD-10-CM

## 2022-10-16 DIAGNOSIS — Z79899 Other long term (current) drug therapy: Secondary | ICD-10-CM

## 2022-10-16 NOTE — Patient Instructions (Signed)
Below is our plan:  We will continue current treatment plan. I will update labs, today. We will complete paperwork for powered wheelchair.   Please make sure you are staying well hydrated. I recommend 50-60 ounces daily. Well balanced diet and regular exercise encouraged. Consistent sleep schedule with 6-8 hours recommended.   Please continue follow up with care team as directed.   Follow up with Dr Felecia Shelling in 6 months   You may receive a survey regarding today's visit. I encourage you to leave honest feed back as I do use this information to improve patient care. Thank you for seeing me today!

## 2022-10-17 ENCOUNTER — Encounter: Payer: Self-pay | Admitting: Family Medicine

## 2022-10-17 LAB — CBC WITH DIFFERENTIAL/PLATELET
Basophils Absolute: 0.1 10*3/uL (ref 0.0–0.2)
Basos: 1 %
EOS (ABSOLUTE): 0.2 10*3/uL (ref 0.0–0.4)
Eos: 6 %
Hematocrit: 44.1 % (ref 37.5–51.0)
Hemoglobin: 15 g/dL (ref 13.0–17.7)
Immature Grans (Abs): 0 10*3/uL (ref 0.0–0.1)
Immature Granulocytes: 0 %
Lymphocytes Absolute: 0.6 10*3/uL — ABNORMAL LOW (ref 0.7–3.1)
Lymphs: 16 %
MCH: 29.6 pg (ref 26.6–33.0)
MCHC: 34 g/dL (ref 31.5–35.7)
MCV: 87 fL (ref 79–97)
Monocytes Absolute: 0.4 10*3/uL (ref 0.1–0.9)
Monocytes: 9 %
Neutrophils Absolute: 2.6 10*3/uL (ref 1.4–7.0)
Neutrophils: 68 %
Platelets: 160 10*3/uL (ref 150–450)
RBC: 5.07 x10E6/uL (ref 4.14–5.80)
RDW: 13.1 % (ref 11.6–15.4)
WBC: 3.9 10*3/uL (ref 3.4–10.8)

## 2022-10-17 LAB — HEPATIC FUNCTION PANEL
ALT: 22 IU/L (ref 0–44)
AST: 19 IU/L (ref 0–40)
Albumin: 4.5 g/dL (ref 4.1–5.1)
Alkaline Phosphatase: 45 IU/L (ref 44–121)
Bilirubin Total: 0.9 mg/dL (ref 0.0–1.2)
Bilirubin, Direct: 0.22 mg/dL (ref 0.00–0.40)
Total Protein: 6.3 g/dL (ref 6.0–8.5)

## 2022-10-18 NOTE — Telephone Encounter (Signed)
Please cancel any pending lab orders

## 2022-10-29 ENCOUNTER — Other Ambulatory Visit: Payer: Self-pay

## 2022-10-31 ENCOUNTER — Other Ambulatory Visit: Payer: Self-pay

## 2022-12-15 ENCOUNTER — Encounter: Payer: Self-pay | Admitting: Neurology

## 2022-12-19 ENCOUNTER — Other Ambulatory Visit: Payer: Self-pay | Admitting: Neurology

## 2022-12-19 DIAGNOSIS — G35 Multiple sclerosis: Secondary | ICD-10-CM

## 2022-12-19 NOTE — Telephone Encounter (Signed)
Last seen on 10/17/22 per note "He will continue generic Vumerity 1 tablet twice daily.  "  Follow up scheduled on 01/09/23

## 2022-12-24 ENCOUNTER — Telehealth: Payer: Self-pay | Admitting: *Deleted

## 2022-12-24 NOTE — Telephone Encounter (Signed)
Faxed signed PT mobility/seating eval rx back to [facility] at [phone]. Received fax confirmation.

## 2023-01-02 ENCOUNTER — Ambulatory Visit: Payer: Medicare Other | Attending: Family Medicine | Admitting: Physical Therapy

## 2023-01-02 DIAGNOSIS — R531 Weakness: Secondary | ICD-10-CM | POA: Insufficient documentation

## 2023-01-02 DIAGNOSIS — G35 Multiple sclerosis: Secondary | ICD-10-CM | POA: Diagnosis not present

## 2023-01-02 DIAGNOSIS — M6281 Muscle weakness (generalized): Secondary | ICD-10-CM | POA: Diagnosis not present

## 2023-01-02 DIAGNOSIS — R2689 Other abnormalities of gait and mobility: Secondary | ICD-10-CM | POA: Diagnosis not present

## 2023-01-02 DIAGNOSIS — R261 Paralytic gait: Secondary | ICD-10-CM | POA: Insufficient documentation

## 2023-01-02 DIAGNOSIS — R252 Cramp and spasm: Secondary | ICD-10-CM | POA: Insufficient documentation

## 2023-01-02 DIAGNOSIS — R2681 Unsteadiness on feet: Secondary | ICD-10-CM | POA: Diagnosis not present

## 2023-01-02 DIAGNOSIS — R278 Other lack of coordination: Secondary | ICD-10-CM | POA: Insufficient documentation

## 2023-01-02 NOTE — Therapy (Signed)
OUTPATIENT PHYSICAL THERAPY WHEELCHAIR EVALUATION   Patient Name: Alexander [name] MRN: [id number] DOB:04-Feb-1976, 47 y.o., male Today's Date: 01/02/2023  END OF SESSION:  PT End of Session - 01/02/23 1319     Visit Number 1    Number of Visits 1    Date for PT Re-Evaluation 01/02/23    Authorization Type [employer] Medicare    PT Start Time 1317    PT Stop Time 1415    PT Time Calculation (min) 58 min    Equipment Utilized During Treatment Gait belt    Activity Tolerance Patient tolerated treatment well    Behavior During Therapy WFL for tasks assessed/performed             Past Medical History:  Diagnosis Date   Hypertension    Multiple sclerosis (HCC) sees Dr. 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   Mid back pain 08/13/2018   Transverse myelitis (HCC) 02/19/2018   History of optic neuritis 02/10/2018   Hand weakness 07/10/2016   Spastic gait 03/07/2016   Multiple sclerosis (HCC) 03/07/2016   Urinary dysfunction 03/07/2016   Tremor 03/07/2016    PCP: Nelwyn Salisbury, MD  REFERRING PROVIDER: Shawnie Dapper, NP  THERAPY DIAG:  Muscle weakness (generalized)  Other abnormalities of gait and mobility  Other lack of coordination  Unsteadiness on feet  Rationale for Evaluation and Treatment Habilitation  SUBJECTIVE:                                                                                                                                                                                           SUBJECTIVE STATEMENT: Pt presents for a power wheelchair evaluation. He reports that he was diagnosed with MS in 2014. Pt reports that currently he is walking with a rollator at home but is  only able to walk very short distances and has frequent falls. Pt reports he has had at least 3 falls in the last 6 months and usually has to call for assist to get back up from the floor (calls EMS). Pt reports that his walking is very slow and painful and he does not always make it to the bathroom in time. Pt states he has never had a wheelchair but that he is interested in obtaining a power wheelchair so that he can  perform his MRADLs more safely and efficiently.   PRECAUTIONS: Fall  WEIGHT BEARING RESTRICTIONS No   OCCUPATION: on disability  PLOF:  Independent with gait, Independent with transfers, and Requires assistive device for independence  PATIENT GOALS: to obtain power wheeled mobility         MEDICAL HISTORY:  Primary diagnosis onset: 03/07/2016 per chart Diagnosis  Code: R26.1 Diagnosis: spastic gait   Diagnosis code:  G35    Diagnosis: multiple sclerosis  Diagnosis  Code:  Diagnosis:   [x] Progressive disease  Relevant future surgeries: N/A    Height: 5'8" Weight: 150 lbs Explain recent changes or trends in weight:  N/A    History:  Past Medical History:  Diagnosis Date   Hypertension    Multiple sclerosis (HCC) sees Dr. Despina Arias   Vision abnormalities             Cardio Status:  Functional Limitations: impaired endurance  [] Intact  [x]  Impaired      Respiratory Status:  Functional Limitations:   [x] Intact  [] Impaired   [] SOB [] COPD [] O2 Dependent ______LPM  [] Ventilator Dependent  Resp equip:                                                     Objective Measure(s):   Orthotics:   [] Amputee:                                                             [] Prosthesis:        HOME ENVIRONMENT:  [x] House [] Condo/town home [] Apartment [] Asst living [] LTCF         [] Own  [x] Rent   [] Lives alone [x] Lives with others -       24/7 with mom                      Hours without assistance: 0  [x] Home is accessible to patient                                 Storage  of wheelchair:  [x] In home   [] Other Comments:        COMMUNITY :  TRANSPORTATION:  [] Car [] Merchandiser, retail [] Adapted w/c Lift []  Ambulance [] Other:                     [x] Sits in wheelchair during transport   Where is w/c stored during transport?  [x] [employer]  []  [employer]  r   [] Self-Driver       Drive while in  Biomedical scientist [] yes [x] no   Employment and/or school: N/A Specific requirements pertaining to mobility        Other:  COMMUNICATION:  Verbal Communication  [x] WFL [] receptive [] WFL [] expressive [] Understandable  [] Difficult to understand  [] non-communicative  Primary Language:____English__________ 2nd:_____________  Communication provided by:[x] Patient [] Family [] Caregiver [] Translator   [] Uses an augmentative communication device     Manufacturer/Model :  MOBILITY/BALANCE:  Sitting Balance  Standing Balance  Transfers  Ambulation   [] WFL      [] WFL  [x] Independent  []  Independent   [x] Uses UE for balance in sitting Comments: unable to maintain sitting balance without resting UE on thighs and sitting in flexed position; has spasticity and extensor tone which can lead to loss of balance backwards when LE lifted up from the floor [x] Uses UE/device for stability Comments: needs RW for stability in standing; only able to stand for 5 min before onset of fatigue []  Min assist  []  Ambulates independently with       device:___________________      []  Mod assist  [x]  Able to ambulate ___10___ feet        safely/functionally/independently   []  Min assist  []  Min assist  []  Max assist  []  Non-functional ambulator         History/High risk of falls   []  Mod assist  []  Mod assist  []  Dependent  []  Unable to ambulate   []  Max  assist  []  Max assist  Transfer method:[] 1 person [] 2 person [] sliding board [] squat pivot [] stand pivot [] mechanical patient lift  [] other:   []  Unable  []  Unable    Fall History: # of  falls in the past 6 months? 3 # of "near" falls in the past 6 months? "Too many to count"    CURRENT SEATING / MOBILITY:  Current Mobility Device: [] None [x] Cane/Walker [] Manual [] Dependent [] Dependent w/ Tilt rScooter  [] Power (type of control):   Manufacturer:  Model:  Serial #:   Size:  Color:  Age:   Purchased by whom:   Current condition of mobility base:    Current seating system:                                                                       Age of seating system:    Describe posture in present seating system:    Is the current mobility meeting medical necessity?:  [] Yes [x] No Describe: Patient currently only owns a RW and performs short distance mobility in his home with this RW. He is unable to make it it to his bathroom in a timely manner due to pain, weakness, and impaired endurance walking with his walker and therefore has frequent episodes of incontinence which can lead to skin breakdown. Additionally, due to his impaired endurance he is unable to complete his MRADL's independently as he is only able to stand for less than 5 minutes at a time with his RW.                                    Ability to complete Mobility-Related Activities of Daily Living (MRADL's) with Current Mobility Device:   Move room to room  [x] Independent  [] Min [] Mod [] Max assist  [] Unable  Comments: unable to get to bathroom in timely manner and therefore has episodes of incontinence; additionally he is unable to perform meal prep as he currently only owns a RW and is unable to stand for periods of time long enough to complete this MRADL  Meal prep  [] Independent  [] Min [] Mod [] Max assist  [x] Unable  Feeding  [x] Independent  [] Min [] Mod [] Max assist  [] Unable    Bathing  [x] Independent  [] Min [] Mod [] Max assist  [] Unable    Grooming  [x] Independent  [] Min [] Mod [] Max assist  [] Unable    UE dressing  [x] Independent  [] Min [] Mod [] Max assist  [] Unable    LE dressing  [x] Independent   [] Min [] Mod  [] Max assist  [] Unable    Toileting  [x] Independent  [] Min [] Mod [] Max assist  [] Unable    Bowel Mgt: []  Continent []  Incontinent [x]  Accidents []  Diapers []  Colostomy []  Bowel Program:  Bladder Mgt: []  Continent [x]  Incontinent [x]  Accidents []  Diapers []  Urinal []  Intermittent Cath []  Indwelling Cath []  Supra-pubic Cath     Current Mobility Equipment Trialed/ Ruled Out:    Does not meet mobility needs due to:    Mark all boxes that indicate inability to use the specific equipment listed     Meets needs for safe  independent functional  ambulation  / mobility    Risk of  Falling or History of Falls    Enviromental limitations      Cognition    Safety concerns with  physical ability    Decreased / limitations endurance  & strength     Decreased / limitations  motor skills  & coordination    Pain    Pace /  Speed    Cardiac and/or  respiratory condition    Contra - indicated by diagnosis   Cane/Crutches  []   [x]   []   []   [x]   [x]   []   [x]   [x]   []   []    Walker / Rollator  []  NA   []   [x]   []   []   [x]   [x]   []   [x]   [x]   []   []     Manual Wheelchair [id number]:  []  NA  []   []   []   []   [x]   [x]   []   []   [x]   []   []    Manual W/C (K0005) with power assist  []  NA  []   []   []   []   [x]   [x]   []   []   [x]   []   []    Scooter  []  NA  []   []   []   []   [x]   [x]   []   []   [x]   []   []    Power Wheelchair: standard joystick  []  NA  [x]   []   []   []   []   []   []   []   []   []   []    Power Wheelchair: alternative controls  [x]  NA  []   []   []   []   []   []   []   []   []   []   []    Summary:  The least costly alternative for independent functional mobility was found to be:    []  Crutch/Cane  []  Walker []  Manual w/c  []  Manual w/c with power assist   []  Scooter   [x]  Power w/c std joystick   []  Power w/c alternative control        []  Requires dependent care mobility Market researcher for [employer] skills are adequate for safe mobility equipment operation  [x]   Yes []    No  Patient is willing and motivated to use recommended mobility equipment  [x]   Yes []   No       []  Patient is unable to safely operate mobility equipment independently and requires dependent care equipment Comments:  SENSATION and SKIN ISSUES:  Sensation []  Intact  [x]  Impaired []  Absent []  Hyposensate []  Hypersensate  []  Defensiveness  Location(s) of impairment: impaired in distal LE   Pressure Relief Method(s):  []  Lean side to side to offload (without risk of falling)  []   W/C push up (4+ times/hour for 15+ seconds) [x]  Stand up (without risk of falling)    []  Other: (Describe): Effective pressure relief method(s) above can be performed consistently throughout the day: rYes  r No If not, Why?:  Skin Integrity Risk:       [x]  Low risk           []  Moderate risk            []  High risk  If high risk, explain:   Skin Issues/Skin Integrity  Current skin Issues  []  Yes [x]  No []  Intact  []   Red area   []   Open area  []  Scar tissue  []  At risk from prolonged sitting  Where: History of Skin Issues  []  Yes [x]  No Where : When: Stage: Hx of skin flap surgeries  []  Yes [x]  No Where:  When:  Pain: [x]  Yes []  No   Pain Location(s): mid back, between shoulder blades and in BLE Intensity scale: (0-10) : 7-8 How does pain interfere with mobility and/or MRADLs? - during mobility with RW pt's BLE drag along the floor and his R knee hyperextends which makes it difficult to for him to ambulate due to pain from this        MAT EVALUATION:  Neuro-Muscular Status: (Tone, Reflexive, Responses, etc.)     []   Intact   [x]  Spasticity: in trunk and BLE limiting his ability to perform ambulation []  Hypotonicity  []  Fluctuating  []  Muscle Spasms  []  Poor Righting Reactions/Poor Equilibrium Reactions  []  Primal Reflex(s):    Comments: Pt has spasticity in his trunk and his BLE which limits his ability to sit unsupported and to perform ambulation in a safe and functional manner  independently and increases his fall risk.           COMMENTS:    POSTURE:     Comments:  Pelvis Anterior/Posterior:  []  Neutral   [x]  Posterior  []  Anterior  []  Fixed - No movement [x]  Tendency away from neutral []  Flexible []  Self-correction []  External correction Obliquity (viewed from front)  [x]  WFL []  R Obliquity []  L Obliquity  []  Fixed - No movement []  Tendency away from neutral []  Flexible []  Self-correction []  External correction Rotation  [x]  WFL []  R anterior []  L anterior  []  Fixed - No movement []  Tendency away from neutral []  Flexible []  Self-correction []  External correction Tonal Influence Pelvis:  []  Normal []  Flaccid []  Low tone [x]  Spasticity []  Dystonia []  Pelvis thrust []  Other:    Trunk Anterior/Posterior:  []  WFL [x]  Thoracic kyphosis []  Lumbar lordosis  []  Fixed - No movement [x]  Tendency away from neutral []  Flexible []  Self-correction []  External correction  [x]  WFL []  Convex to left  []  Convex to right []  S-curve   []  C-curve []  Multiple curves []  Tendency away from neutral []  Flexible []  Self-correction []  External correction Rotation of shoulders and upper trunk:  [x]  Neutral []  Left-anterior []  Right- anterior []  Fixed- no movement []  Tendency away from neutral []  Flexible []  Self correction []  External correction Tonal influence Trunk:  []  Normal []  Flaccid []  Low tone [x]  Spasticity []  Dystonia []  Other:   Head &  Neck  [x]  Functional [x]  Flexed    []  Extended []  Rotated right  []  Rotated left []  Laterally flexed right []  Laterally flexed left []  Cervical hyperextension   [x]  Good head control []  Adequate head control []  Limited head control []  Absent head control Describe tone/movement of head and neck: [facility]     Lower Extremity Measurements: LE ROM:  Passive ROM Right 01/02/2023 Left 01/02/2023  Hip flexion Tight hip flexors Tight hip flexors  Hip extension    Hip abduction     Hip adduction    Knee flexion    Knee extension Tight HS Tight HS  Ankle dorsiflexion Tight gastroc Tight gastroc  Ankle plantarflexion     (Blank rows = not tested)  LE MMT:  MMT Right 01/02/2023 Left 01/02/2023  Hip flexion 1 1  Hip extension    Hip abduction    Hip adduction    Knee flexion 0 0  Knee extension 4 4  Ankle dorsiflexion 2 2  Ankle plantarflexion     (Blank rows = not tested)  Hip positions:  []  Neutral   [x]  Abducted   []  Adducted  []  Subluxed   []  Dislocated   []  Fixed   []  Tendency away from neutral [x]  Flexible []  Self-correction [x]  External correction   Hip Windswept:[x]  Neutral  []  Right    []  Left  []  Subluxed   []  Dislocated   []  Fixed   []  Tendency away from neutral []  Flexible []  Self-correction []  External correction  LE Tone: []  Normal []  Low tone [x]  Spasticity (clonus) []  Flaccid []  Dystonia []  Rocks/Extends at hip []  Thrust into knee extension []  Pushes legs downward into footrest  Foot positioning: ROM Concerns: Dorsiflexed: []  Right   []  Left Plantar flexed: []  Right    []  Left Inversion: []  Right    []  Left Eversion: []  Right    []  Left  LE Edema: []  1+ (Barely detectable impression when finger is pressed into skin) []  2+ (slight indentation. 15 seconds to rebound) []  3+ (deeper indentation. 30 seconds to rebound) []  4+ (>30 seconds to rebound)  UE Measurements:  UPPER EXTREMITY ROM:   Passive ROM Right 01/02/2023 Left 01/02/2023  Shoulder flexion [facility]  Shoulder abduction [facility]  Shoulder adduction    Elbow flexion [facility] WFL  Elbow extension [facility] WFL  Wrist flexion    Wrist extension    (Blank rows = not tested)  UPPER EXTREMITY MMT:  MMT Right 01/02/2023 Left 01/02/2023  Shoulder flexion 4 4  Shoulder abduction 4 4  Shoulder adduction    Elbow flexion 4 4  Elbow extension 4 4  Wrist flexion    Wrist extension    Pinch strength    Grip strength decreased decreased  (Blank rows = not  tested)  Shoulder Posture:  Right Tendency towards Left  []   Functional []    []   Elevation []    [x]   Depression [x]    [x]   Protraction [x]    []   Retraction []    []   Internal rotation []    []   External rotation []    []   Subluxed []     UE Tone: [x]  Normal []  Flaccid []  Low tone []  Spasticity  []  Dystonia []  Other:   UE Edema: []  1+ (Barely detectable impression when finger is pressed into skin) []  2+ (slight indentation. 15 seconds to rebound) []  3+ (deeper indentation. 30 seconds to rebound) []  4+ (>30 seconds to rebound)  Wrist/Hand: Handedness: [x]  Right   []   Left   []  NA: Comments:  Right  Left  []   WNL []    []   Limitations []    []   Contractures []    []   Fisting []    []   Tremors []    [x]   Weak grasp [x]    [x]   Poor dexterity [x]    []   Hand movement non functional []    []   Paralysis []         MOBILITY BASE RECOMMENDATIONS and JUSTIFICATION:  MOBILITY BASE  JUSTIFICATION   Manufacturer:   Quantum Model:     Stretto                         Color: red Seat Width:  16" Seat Depth: 18"   []  Manual mobility base (continue below)   []  Scooter/POV  [x]  Power mobility base   Number of hours per day spent in above selected mobility base: 16 hours  Typical daily mobility base use Schedule:  Patient will utilize his power wheelchair to perform all of his functional mobility in the home. It will allow him to travel between rooms of his home safely and efficiently in order to complete all of his MRADLs. Pt is able to perform stand pivot transfers independently with his RW but is only able to perform short distance mobility (about 10 ft) with his RW due to pain in his mid-back area and his BLE as well as spasticity in his trunk and LE from his MS in addition to having impaired endurance. Due to this impaired endurance and decreased grip strength he is also unable to safely and efficiently propel himself in a manual wheelchair.    [x]  is not a safe, functional ambulator   []  limitation prevents from completing a MRADL(s) within a reasonable time frame    [x]  limitation places at high risk of morbidity or mortality secondary to  the attempts to perform a    MRADL(s)  []  limitation prevents accomplishing a MRADL(s) entirely  [x]  provide independent mobility  [x]  equipment is a lifetime medical need  [x]  walker or cane inadequate  [x]  any type manual wheelchair      inadequate  [x]  scooter/POV inadequate      []  requires dependent mobility         POWER MOBILITY      []  Scooter/POV    []  can safely operate   []  can safely transfer   []  has adequate trunk stability   []  cannot functionally propel  manual wheelchair    [x]  Power mobility base    []  non-ambulatory   [x]  cannot functionally propel manual wheelchair   [x]  cannot functionally and safely      operate scooter/POV  [x]  can safely operate power       wheelchair  [x]  home is accessible  [x]  willing to use power wheelchair     Tilt  []  Powered tilt on powered chair  []  Powered tilt on manual chair  []  Manual tilt on manual chair Comments:  []  change position for pressure      []  elief/cannot weight shift   []  change position against      gravitational force on head and      shoulders   []  decrease pain  []  blood pressure management   []  control autonomic dysreflexia  []  decrease respiratory distress  []  management of spasticity  []  management of low tone  []  facilitate postural control   []  rest periods   []   control edema  []  increase sitting tolerance   []  aid with transfers     Recline   []  Power recline on power chair  []  Manual recline on manual chair  Comments:    []  intermittent catheterization  []  manage spasticity  []  accommodate femur to back angle  []  change position for pressure relief/cannot weight shift rhigh risk of pressure sore development  []  tilt alone does not accomplish     effective pressure relief, maximum pressure relief achieved at -      _______  degrees tilt   _______ degrees recline   []  difficult to transfer to and from bed []  rest periods and sleeping in chair  []  repositioning for transfers  []  bring to full recline for ADL care  []  clothing/diaper changes in chair  []  gravity PEG tube feeding  []  head positioning  []  decrease pain  []  blood pressure management   []  control autonomic dysreflexia  []  decrease respiratory distress  []  user on ventilator     Elevator on mobility base  []  Power wheelchair  []  Scooter  []  increase Indep in transfers   []  increase Indep in ADLs    []  bathroom function and safety  []  kitchen/cooking function and safety  []  shopping  []  raise height for communication at standing level  []  raise height for eye contact which reduces cervical neck strain and pain  []  drive at raised height for safety and navigating crowds  []  Other:   []  Vertical position system  (anterior tilt)     (Drive locks-out)    []  Stand       (Drive enabled)  []  independent weight bearing  []  decrease joint contractures  []  decrease/manage spasticity  []  decrease/manage spasms  []  pressure distribution away from   scapula, sacrum, coccyx, and ischial tuberosity  []  increase digestion and elimination   []  access to counters and cabinets  []  increase reach  []  increase interaction with others at eye level, reduces neck strain  []  increase performance of       MRADL(s)      Power elevating legrest    []  Center mount (Single) 85-170 degrees       []  Standard (Pair) 100-170 degrees  []  position legs at 90 degrees, not available with std power ELR  []  center mount tucks into chair to decrease turning radius in home, not available with std power ELR  []  provide change in position for LE  []  elevate legs during recline    []  maintain placement of feet on      footplate  []  decrease edema  []  improve circulation  []  actuator needed to elevate legrest  []  actuator needed to articulate legrest preventing knees  from flexing  []  Increase ground clearance over      curbs  []   STD (pair) independently                     elevate legrest   POWER WHEELCHAIR CONTROLS      Controls/input device  []  Expandable  [x]  Non-expandable  [x]  Proportional  [x]  Right Hand []  Left Hand  []  Non-proportional/switches/head-array  []  Electrical/proximity         []   Mechanical      Manufacturer:___________________   Type:________joystick_____________ [x]  provides access for controlling wheelchair  [x]  programming for accurate control  [x]  progressive disease/changing condition  []  required for alternative drive      controls       []   lacks motor control to operate  proportional drive control  []  unable to understand proportional controls  []  limited movement/strength  []  extraneous movement / tremors / ataxic / spastic       []  Upgraded electronics controller/harness    []  Single power (tilt or recline)   []  Expandable    []  Non-expandable plus   []  Multi-power (tilt, recline, power legrest, power seat lift, vertical positioning system, stand)  []  allows input device to communicate with drive motors  []  harness provides necessary connections between the controller, input device, and seat functions     []  needed in order to operate power seat functions through joystick/ input device  []  required for alternative drive controls     []  Enhanced display  []  required to connect all alternative drive controls   []  required for upgraded joystick      (lite-throw, heavy duty, micro)  []  Allows user to see in which mode and drive the wheelchair is set; necessary for alternate controls       []  Upgraded tracking electronics  []  correct tracking when on uneven surfaces makes switch driving more efficient and less fatiguing  []  increase safety when driving  []  increase ability to traverse thresholds    []  Safety / reset / mode switches     Type:    []  Used to change modes and stop the wheelchair when driving      [x]  Mount for joystick / input device/switches  [x]  swing away for access or transfers   [x]  attaches joystick / input device / switches to wheelchair   [x]  provides for consistent access  []  midline for optimal placement    []  Attendant controlled joystick plus     mount  []  safety  []  long distance driving  []  operation of seat functions  []  compliance with transportation regulations    [x]  Battery  [x]  required to power (power assist / scooter/ power wc / other):   []  Power inverter (24V to 12V)  []  required for ventilator / respiratory equipment / other:     CHAIR OPTIONS MANUAL & POWER      Armrests   [x]  adjustable height []  removable  []  swing away []  fixed  [x]  flip back  []  reclining  [x]  full length pads []  desk []  tube arms []  gel pads  [x]  provide support with elbow at 90    [x]  remove/flip back/swing away for  transfers  [x]  provide support and positioning of upper body    []  allow to come closer to table top  []  remove for access to tables  []  provide support for w/c tray  [x]  change of height/angles for       variable activities   []  Elbow support / Elbow stop  []  keep elbow positioned on arm pad  []  keep arms from falling off arm pad  during tilt and/or recline   Upper Extremity Support  []  Arm trough  []   R  []   L  Style:  []  swivel mount []  fixed mount   []  posterior hand support  []   tray  []  full tray  []  joystick cut out  []   R  []   L  Style:  []  decrease gravitational pull on      shoulders  []  provide support to increase UE  function  []  provide hand support in natural    position  []  position flaccid UE  []  decrease subluxation    []  decrease  edema       []  manage spasticity   []  provide midline positioning  []  provide work surface  []  placement for AAC/ Computer/ EADL       Hangers/ Legrests   []  ______ degree  []  Elevating []  articulating  []  swing away []  fixed []  lift off  []  heavy duty []  adjustable knee angle  []  adjustable calf panel    []  longer extension tube              []  provide LE support  []  maintain placement of feet on      footplate   []  accommodate lower leg length  []  accommodate to hamstring       tightness  []  enable transfers  []  provide change in position for LE's  []  elevate legs during recline    []  decrease edema  []  durability      Foot support   [x]  footplate []  R []  L [x]  flip up           [x]  Depth adjustable   [x]  angle adjustable  []  foot board/one piece    [x]  provide foot support  []  accommodate to ankle ROM  []  allow foot to go under wheelchair base  [x]  enable transfers     []  Shoe holders  []  position foot    []  decrease / manage spasticity  []  control position of LE  []  stability    []  safety     []  Ankle strap/heel      loops  []  support foot on foot support  []  decrease extraneous movement  []  provide input to heel   []  protect foot     []  Amputee adapter []  R  []  L     Style:                  Size:  []  Provide support for stump/residual extremity    []  Transportation tie-down  []  to provide crash tested tie-down brackets    []  Crutch/cane holder    []  O2 holder    []  IV hanger   []  Ventilator tray/mount    []  stabilize accessory on wheelchair       Component  Justification     [x]  Seat cushion     Captain Seat []  accommodate impaired sensation  []  decubitus ulcers present or history  []  unable to shift weight  []  increase pressure distribution  []  prevent pelvic extension  []  custom required "off-the-shelf"    seat cushion will not accommodate deformity  [x]  stabilize/promote pelvis alignment  [x]  stabilize/promote femur alignment  []  accommodate obliquity  []  accommodate multiple deformity  [x]  incontinent/accidents  [x]  low maintenance     []  seat mounts                 []  fixed []  removable  []  attach seat platform/cushion to wheelchair frame    []  Seat wedge    []  provide increased aggressiveness of seat shape to decrease sliding  down in the seat  []   accommodate ROM        []  Cover replacement   []  protect back or seat cushion  []  incontinent/accidents    []  Solid seat / insert    []  support cushion to prevent      hammocking  []  allows attachment of cushion to mobility base    [x]  Lateral pelvic/thigh/hip     support (Guides)     [x]  decrease abduction  []   accommodate pelvis  [x]  position upper legs  [x]  accommodate spasticity  [x]  removable for transfers     [x]  Lateral pelvic/thigh      supports mounts  []  fixed   []  swing-away   [x]  removable  [x]  mounts lateral pelvic/thigh supports     [x]  mounts lateral pelvic/thigh supports swing-away or removable for transfers    []  Medial thigh support (Pommel)  [] decrease adduction  [] accommodate ROM  []  remove for transfers   []  alignment      []  Medial thigh   []  fixed      support mounts      []  swing-away   []  removable  []  mounts medial thigh supports   []  Mounts medial supports swing- away or removable for transfers       Component  Justification   []  Back       []  provide posterior trunk support []  facilitate tone  []  provide lumbar/sacral support []  accommodate deformity  []  support trunk in midline   []  custom required "off-the-shelf" back support will not accommodate deformity   []  provide lateral trunk support []  accommodate or decrease tone            []  Back mounts  []  fixed  []  removable  []  attach back rest/cushion to wheelchair frame   []  Lateral trunk      supports  []  R []  L  []  decrease lateral trunk leaning  []  accommodate asymmetry    []  contour for increased contact  []  safety    []  control of tone    []  Lateral trunk      supports mounts  []  fixed  []  swing-away   []  removable  []  mounts lateral trunk supports     []  Mounts lateral trunk supports swing-away or removable for transfers   []  Anterior chest      strap, vest     []  decrease forward movement of shoulder  []  decrease forward movement of trunk  []  safety/stability  []  added abdominal support   []  trunk alignment  []  assistance with shoulder control   []  decrease shoulder elevation    []  Headrest      []  provide posterior head support  []  provide posterior neck support  []  provide lateral head support  []  provide anterior head support  []  support during tilt and recline  []  improve feeding     []  improve respiration  []  placement of switches  []  safety    []  accommodate ROM   []  accommodate tone  []  improve visual orientation   []  Headrest           []  fixed []  removable []  flip down      Mounting hardware   []  swing-away laterals/switches  []  mount headrest   []  mounts headrest flip down or  removable for transfers  []  mount headrest swing-away laterals   []  mount switches     []  Neck Support    []  decrease neck rotation  []  decrease forward neck flexion   Pelvic Positioner    []  std hip belt          []  padded hip belt  []  dual pull hip belt  []  four point hip belt  []  stabilize tone  []  decrease falling out of chair  []  prevent excessive extension  []  special pull angle to control      rotation  []  pad for protection over boney   prominence  []  promote comfort    []   Essential needs        bag/pouch   []  medicines []  special food rorthotics []  clothing changes  []  diapers  []  catheter/hygiene []  ostomy    The above equipment has a life- long use expectancy.  Growth and changes in medical and/or functional conditions would be the exceptions.   SUMMARY:  Why mobility device was selected; include why a lower level device is not appropriate: Patient requires use of a power wheelchair in order to perform safe and independent mobility in his home and in order to perform his MRADLs in a timely manner. Due to his impaired endurance, impaired grip strength, trunk and LE spasticity, and diagnosis of MS he is unable to safely and efficiently propel himself in a manual wheelchair without putting significant strain on his shoulder joints and putting himself a risk for injury.  Additionally, due to afore-mentioned impairments he is unable to ambulate further than 10 ft and has poor standing tolerance, therefore requiring power wheeled mobility.   ASSESSMENT:  CLINICAL IMPRESSION: Patient is a 47 y.o. male who was seen today for physical therapy evaluation and treatment for a new power wheelchair.    OBJECTIVE IMPAIRMENTS Abnormal gait, cardiopulmonary status limiting activity, decreased activity tolerance, decreased balance, decreased coordination, decreased mobility, difficulty walking, decreased ROM, decreased strength, increased muscle spasms, impaired flexibility, impaired sensation, impaired tone, impaired UE functional use, postural dysfunction, and pain.   ACTIVITY LIMITATIONS carrying, lifting, bending, standing, squatting, stairs, and continence  PARTICIPATION LIMITATIONS: meal prep, cleaning, driving, and community activity  PERSONAL FACTORS Time since onset of injury/illness/exacerbation and Transportation are also affecting patient's functional outcome.   CLINICAL DECISION MAKING: Stable/uncomplicated  EVALUATION COMPLEXITY: High                                   GOALS: One time visit. No goals established.    PLAN: PT FREQUENCY: one time visit    Peter Congo, PT, DPT, CSRS 01/02/2023, 2:21 PM    I concur with the above findings and recommendations of the therapist:  Physician name printed:         Physician's signature:      Date:

## 2023-01-08 NOTE — Progress Notes (Unsigned)
[facility]  PATIENT: Alexander [name] DOB: 1976-05-12  REFERRING DOCTOR OR PCP:  Dr. Eleonore Chiquito (PCP)  _________________________________   HISTORICAL  CHIEF COMPLAINT:  No chief complaint on file.   HISTORY OF PRESENT ILLNESS:  Alexander [name] is a 47 y.o. man with a relapsing/active form of secondary progressive multiple sclerosis diagnosed in 2003.     Update 04/17/2022: He is on Vumerity.   He has no exacerbations.   However, he is not tolerating the medication well.  He would like to discuss alternatives.  Since the last visit, he has had MRI of the brain and cervical spine.  These did not show any new lesions.  Gait and balance are off.  He uses a walker both inside and outside.  He can go  > 100 feet.   He has had a couple fall due to tripping and balance   Falls tend to be backwards.  He does worse in the heat.  Left arm is worse still but right hand seems to be catching up with more weakness.   Left > right legs have spasticity, worse at night. He takes baclofen which helps but makes him feel weaker.  He has poor coordination, left > right  Bladder is inconsistent, sometimes with urgency but no incontinence in last 6 months.  Vision is doing the same with sequela of right optic neuritis - blurry and reduced colors.    [name]     He feels fatigue is mostly stable.  He does worse in heat.  [name]   He still takes a nap daily.   He sleeps 6-8 hours at night.  Sometimes he is not refreshed in the morning.  He feels depression better than last year.  He is sometimes irritable.   Cognition is doing well.   He was hospitalized in January 2023 for Covid and felt a lot weaker.   After fluids and a couple days he was much better.   He did PT with benefit.   MRi report mentioned one additional midbrain lesion but I see no new lesions.   Multiple cervical spine lesions look stable.    MS history: In 2003 he had optic neuritis started on the left and then also on the right. He  had an MRI of the brain performed at that time and was told that it was normal. He had follow-up MRIs every year or 2 for several more years but they continue to be normal. In 2012, he noticed worsening issues with gait. He had had some difficulty with the gait for the previous couple of years but due to orthopedic issues a neurologic cause was not looked into. An MRI of the cervical spine showed multiple plaques consistent with MS. Initially, he was placed on Copaxone. Although he tolerated Copaxone well he had some breakthrough disease with a new spot developing in the brain. Therefore he was switched to Tysabri. He tolerated Tysabri well and his MS was well controlled. However, she was JCV antibody positive a decision was made to stop Tysabri after 2 years. Years ago he went on Gilenya. He has tolerated it well and he did better on Tysabri. He was seeing Dr. Carroll Sage at [facility] on [place]. He had Ocrevus August 2017.  He switched from Ocrevus to Vumerity January 2021 due to continued progression and fatigue while on Ocrevus.  MRI images MRI of the brain 09/21/2019 showed multiple T2/FLAIR hyperintense foci, mostly in the infratentorial white matter in a pattern  and configuration consistent with chronic demyelinating plaque associated with multiple sclerosis.  None of the foci enhances or appears to be acute.  Compared to the MRI dated 02/18/2018, there are no new lesions.  MRI of the cervical spine 09/21/2019 showed patchy hyperintense signal within the spinal cord from C2-C6.  Another small focus is noted at C7-T1 to the right.  Compared to the MRI from 02/18/2018, there are no new lesions.  Additionally, there are multilevel degenerative changes, mostly mild.  No definite nerve root compression or spinal stenosis.  MRI of the cervical spine 08/22/2022 showed T2 hyperintense signal from C2 to C6-C7 and again at C7 - T1. None of the foci enhance. Compared to the MRI from 08/09/2021, there  did not appear to be any new lesions there are degenerative changes in the cervical spine with mild spinal stenosis at C5-C6 and various degrees of foraminal narrowing.  No nerve root compression.  MRI of the brain 08/22/2022 showed Scattered T2/FLAIR hyperintense foci in the brainstem, cerebellum and cerebral hemispheres in a pattern consistent with chronic demyelinating plaque associated with multiple sclerosis. None of the foci appear to be acute. They do not enhance. Compared to the MRI from 08/09/2021, there are no new lesions.      REVIEW OF SYSTEMS: Constitutional: No fevers, chills, sweats, or change in appetite.   Reports minimal fatigue. Eyes: No visual changes, double vision, eye pain Ear, nose and throat: No hearing loss, ear pain, nasal congestion, sore throat Cardiovascular: No chest pain, palpitations Respiratory:  No shortness of breath at rest or with exertion.   No wheezes GastrointestinaI: No nausea, vomiting, diarrhea, abdominal pain, fecal incontinence Genitourinary:  reports frequency, hesitancy and nocturia. Musculoskeletal:  No neck pain, back pain Integumentary: No rash, pruritus, skin lesions Neurological: as above Psychiatric: No depression at this time.  No anxiety Endocrine: No palpitations, diaphoresis, change in appetite, change in weigh or increased thirst Hematologic/Lymphatic:  No anemia, purpura, petechiae. Allergic/Immunologic: No itchy/runny eyes, nasal congestion, recent allergic reactions, rashes  ALLERGIES: No Known Allergies  HOME MEDICATIONS:  Current Outpatient Medications:    Ascorbic Acid (VITA-C PO), Take 1,000 mg by mouth daily., Disp: , Rfl:    baclofen (LIORESAL) 10 MG tablet, Take 10mg  three times daily as needed, Disp: 270 tablet, Rfl: 3   Cholecalciferol (DIALYVITE VITAMIN D 5000) 125 MCG (5000 UT) capsule, Take 5,000 Units by mouth daily., Disp: , Rfl:    Cyanocobalamin (B-12 PO), Take 1 Dose by mouth every other day., Disp: , Rfl:     escitalopram (LEXAPRO) 20 MG tablet, Take 1 tablet (20 mg total) by mouth daily., Disp: 90 tablet, Rfl: 3   MAGNESIUM PO, Take 500 mg by mouth daily., Disp: , Rfl:    Misc Natural Products (NF FORMULAS TESTOSTERONE) CAPS, Take 165 mg by mouth daily., Disp: , Rfl:    Nutritional Supplements (JUICE PLUS FIBRE PO), Take 1 capsule by mouth daily., Disp: , Rfl:    Omega-3 Fatty Acids (FISH OIL) 1000 MG CAPS, Take 1,200 mg by mouth daily., Disp: , Rfl:    Probiotic Product (PROBIOTIC PO), Take 3 capsules by mouth daily. Nature Made, Disp: , Rfl:    Turmeric 500 MG TABS, Take 1 Dose by mouth every other day., Disp: , Rfl:    UNABLE TO FIND, Take 500-1,000 mg by mouth daily. CBD oil, Disp: , Rfl:    UNABLE TO FIND, Take 21 g by mouth. Med Name: Vegan protein powder, Disp: , Rfl:    UNABLE  TO FIND, Med Name: [facility] mushroom powder, Disp: , Rfl:    VUMERITY 231 MG CPDR, TAKE 2 CAPSULES (462MG ) BY MOUTH TWICE DAILY, IN THE MORNING AND AT BEDTIME, Disp: 360 capsule, Rfl: 0  PAST MEDICAL HISTORY: Past Medical History:  Diagnosis Date   Hypertension    Multiple sclerosis (HCC) sees Dr. Despina Arias   Vision abnormalities     PAST SURGICAL HISTORY: Past Surgical History:  Procedure Laterality Date   ACHILLES TENDON SURGERY     ANTERIOR CRUCIATE LIGAMENT REPAIR     MENISCUS REPAIR      FAMILY HISTORY: Family History  Problem Relation Age of Onset   Congestive Heart Failure Father    Stroke Maternal Grandfather    Multiple sclerosis Sister     SOCIAL HISTORY:  Social History   Socioeconomic History   Marital status: Divorced    Spouse name: Not on file   Number of children: Not on file   Years of education: Not on file   Highest education level: Bachelor's degree (e.g., BA, AB, BS)  Occupational History   Not on file  Tobacco Use   Smoking status: Never   Smokeless tobacco: Never  Vaping Use   Vaping Use: Never used  Substance and Sexual Activity   Alcohol use: No   Drug  use: No   Sexual activity: Not on file  Other Topics Concern   Not on file  Social History Narrative   Not on file   Social Determinants of Health   Financial Resource Strain: Medium Risk (08/14/2022)   Overall Financial Resource Strain (CARDIA)    Difficulty of Paying Living Expenses: Somewhat hard  Food Insecurity: Food Insecurity Present (08/14/2022)   Hunger Vital Sign    Worried About Running Out of Food in the Last Year: Sometimes true    Ran Out of Food in the Last Year: Patient declined  Transportation Needs: No Transportation Needs (08/14/2022)   PRAPARE - Administrator, Civil Service (Medical): No    Lack of Transportation (Non-Medical): No  Physical Activity: Insufficiently Active (08/14/2022)   Exercise Vital Sign    Days of Exercise per Week: 3 days    Minutes of Exercise per Session: 30 min  Stress: No Stress Concern Present (08/14/2022)   [name] [employer] - Occupational Stress Questionnaire    Feeling of Stress : Only a little  Social Connections: Unknown (08/14/2022)   Social Connection and Isolation Panel [NHANES]    Frequency of Communication with Friends and Family: More than three times a week    Frequency of Social Gatherings with Friends and Family: Patient declined    Attends Religious Services: Patient declined    Database administrator or Organizations: No    Attends Engineer, structural: Never    Marital Status: Divorced  Recent Concern: Social Connections - Socially Isolated (06/11/2022)   Social Connection and Isolation Panel [NHANES]    Frequency of Communication with Friends and Family: More than three times a week    Frequency of Social Gatherings with Friends and Family: More than three times a week    Attends Religious Services: Never    Database administrator or Organizations: No    Attends Banker Meetings: Never    Marital Status: Divorced  Catering manager Violence: Not At Risk  (06/11/2022)   Humiliation, Afraid, Rape, and Kick questionnaire    Fear of Current or Ex-Partner: No    Emotionally Abused:  No    Physically Abused: No    Sexually Abused: No     PHYSICAL EXAM  There were no vitals filed for this visit.   There is no height or weight on file to calculate BMI.   General: The patient is well-developed and well-nourished and in no acute distress.      Neurologic Exam  Mental status: The patient is alert and oriented x 3 at the time of the examination. The patient has apparent normal recent and remote memory, with an apparently normal attention span and concentration ability.   Speech is normal.  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 Facial strength and sensation and trapezius strength were normal.  Hearing appeared to be normal and symmetric.  Motor:  Muscle bulk is normal.  Muscle tone is increased in the legs, left greater than right and in the left arm.   Right arm has normal tone.   He has 4+/5 strength in the left leg and 5-/5 strength in the right leg.  Reduced RAM in left ar and both legs (worse on left)  Sensory: He has reduced sensation to vibration in the left leg.  Touch sensation was more symmetric.  Coordination: Finger-nose-finger is performed slowly on the left with mild tremor.  Heel-to-shin is performed poorly on the right and he cannot do on the left.  Gait and station: Station is normal.   The gait is wide and spastic.  He uses a walker.  He has a left foot drop.  He cannot do tandem walk.  Romberg is positive.    Reflexes: Deep tendon reflexes are symmetric and increased in legs,with spread at both knees (worse on left) and nonsustained clonus at left ankle.          ASSESSMENT AND PLAN    No diagnosis found.     1.   Continue Vumerity.  Check labs.  When he was hospitalized in January 2023, the lymphocyte count was  low but it was usually 0.7 or higher previously with other tests while on Vumerity.  We will recheck today.  If the lymphocyte count is 0.6 or lower we may need to adjust the dose  2.   Continue to be active and exercise as tolerated.      Continue Vit D  3.   Renew Lexapro. 4.   He will return to see [place]  in 6 months for a regular visit or call sooner if there are new or worsening neurologic symptoms.     Sayre Mazor A. Epimenio Foot, MD, PhD, FAAN Certified in Neurology, Clinical Neurophysiology, Sleep Medicine, Pain Medicine and Neuroimaging Director, [facility]  [facility] [address] [place], [place] [address] [phone]

## 2023-01-09 ENCOUNTER — Ambulatory Visit: Payer: Medicare Other | Admitting: Neurology

## 2023-01-09 ENCOUNTER — Encounter: Payer: Self-pay | Admitting: Neurology

## 2023-01-09 VITALS — BP 162/104 | HR 84 | Ht 68.0 in | Wt 151.0 lb

## 2023-01-09 DIAGNOSIS — H5121 Internuclear ophthalmoplegia, right eye: Secondary | ICD-10-CM | POA: Diagnosis not present

## 2023-01-09 DIAGNOSIS — G35 Multiple sclerosis: Secondary | ICD-10-CM

## 2023-01-09 DIAGNOSIS — Z79899 Other long term (current) drug therapy: Secondary | ICD-10-CM

## 2023-01-09 DIAGNOSIS — R39198 Other difficulties with micturition: Secondary | ICD-10-CM | POA: Diagnosis not present

## 2023-01-09 DIAGNOSIS — R261 Paralytic gait: Secondary | ICD-10-CM

## 2023-01-10 LAB — COMPREHENSIVE METABOLIC PANEL: BUN/Creatinine Ratio: 13 (ref 9–20)

## 2023-01-11 LAB — QUANTIFERON-TB GOLD PLUS

## 2023-01-11 LAB — HEPATITIS B CORE ANTIBODY, TOTAL: Hep B Core Total Ab: NEGATIVE

## 2023-01-11 LAB — COMPREHENSIVE METABOLIC PANEL
Albumin: 4.6 g/dL (ref 4.1–5.1)
Bilirubin Total: 0.7 mg/dL (ref 0.0–1.2)
Creatinine, Ser: 0.95 mg/dL (ref 0.76–1.27)
Globulin, Total: 2.1 g/dL (ref 1.5–4.5)

## 2023-01-11 LAB — HEPATITIS B SURFACE ANTIBODY,QUALITATIVE: Hep B Surface Ab, Qual: NONREACTIVE

## 2023-01-13 ENCOUNTER — Other Ambulatory Visit: Payer: Self-pay | Admitting: *Deleted

## 2023-01-13 DIAGNOSIS — R261 Paralytic gait: Secondary | ICD-10-CM

## 2023-01-13 DIAGNOSIS — R531 Weakness: Secondary | ICD-10-CM

## 2023-01-13 DIAGNOSIS — G35 Multiple sclerosis: Secondary | ICD-10-CM

## 2023-01-13 NOTE — Progress Notes (Signed)
Faxed signed PT eval for PWC and order for PWC to [employer] at [phone]. Received fax confirmation.

## 2023-01-14 LAB — CBC WITH DIFFERENTIAL/PLATELET
Basophils Absolute: 0.1 10*3/uL (ref 0.0–0.2)
Basos: 1 %
EOS (ABSOLUTE): 0.2 10*3/uL (ref 0.0–0.4)
Eos: 5 %
Hematocrit: 44.7 % (ref 37.5–51.0)
Hemoglobin: 15.2 g/dL (ref 13.0–17.7)
Immature Grans (Abs): 0 10*3/uL (ref 0.0–0.1)
Immature Granulocytes: 0 %
Lymphocytes Absolute: 0.7 10*3/uL (ref 0.7–3.1)
Lymphs: 16 %
MCH: 28.9 pg (ref 26.6–33.0)
MCHC: 34 g/dL (ref 31.5–35.7)
MCV: 85 fL (ref 79–97)
Monocytes Absolute: 0.4 10*3/uL (ref 0.1–0.9)
Monocytes: 8 %
Neutrophils Absolute: 2.9 10*3/uL (ref 1.4–7.0)
Neutrophils: 70 %
Platelets: 169 10*3/uL (ref 150–450)
RBC: 5.26 x10E6/uL (ref 4.14–5.80)
RDW: 12.7 % (ref 11.6–15.4)
WBC: 4.2 10*3/uL (ref 3.4–10.8)

## 2023-01-14 LAB — COMPREHENSIVE METABOLIC PANEL
ALT: 18 IU/L (ref 0–44)
AST: 16 IU/L (ref 0–40)
Albumin/Globulin Ratio: 2.2 (ref 1.2–2.2)
Alkaline Phosphatase: 45 IU/L (ref 44–121)
BUN: 12 mg/dL (ref 6–24)
CO2: 24 mmol/L (ref 20–29)
Calcium: 9.5 mg/dL (ref 8.7–10.2)
Chloride: 104 mmol/L (ref 96–106)
Glucose: 87 mg/dL (ref 70–99)
Potassium: 4.5 mmol/L (ref 3.5–5.2)
Sodium: 141 mmol/L (ref 134–144)
Total Protein: 6.7 g/dL (ref 6.0–8.5)
eGFR: 99 mL/min/{1.73_m2} (ref >59)

## 2023-01-14 LAB — QUANTIFERON-TB GOLD PLUS
QuantiFERON Mitogen Value: 3.74 IU/mL
QuantiFERON Nil Value: 0 IU/mL
QuantiFERON TB2 Ag Value: 0 IU/mL

## 2023-01-14 LAB — HEPATITIS B SURFACE ANTIGEN: Hepatitis B Surface Ag: NEGATIVE

## 2023-01-14 LAB — HIV ANTIBODY (ROUTINE TESTING W REFLEX): HIV Screen 4th Generation wRfx: NONREACTIVE

## 2023-01-16 ENCOUNTER — Encounter: Payer: Self-pay | Admitting: Neurology

## 2023-02-10 ENCOUNTER — Encounter: Payer: Self-pay | Admitting: Neurology

## 2023-02-10 MED ORDER — BACLOFEN 10 MG PO TABS: ORAL_TABLET | ORAL | 3 refills | Status: DC

## 2023-02-10 NOTE — Telephone Encounter (Signed)
Pt last seen on 01/09/23 per note " He takes baclofen prn and CBD daily which helps a little.  " Follow up scheduled on 07/22/23

## 2023-02-19 NOTE — Telephone Encounter (Signed)
Received Power wheelchair standard written order and PT mobility Seating eval, and Detailed product Description x 2.  From [employer] [facility] [phone] fax confirmation received.  [phone].

## 2023-04-30 ENCOUNTER — Ambulatory Visit: Payer: Medicare Other | Admitting: Neurology

## 2023-06-17 ENCOUNTER — Ambulatory Visit (INDEPENDENT_AMBULATORY_CARE_PROVIDER_SITE_OTHER): Payer: Medicare Other | Admitting: Family Medicine

## 2023-06-17 ENCOUNTER — Telehealth: Payer: Self-pay | Admitting: Family Medicine

## 2023-06-17 DIAGNOSIS — Z0000 Encounter for general adult medical examination without abnormal findings: Secondary | ICD-10-CM | POA: Diagnosis not present

## 2023-06-17 NOTE — Progress Notes (Signed)
PATIENT CHECK-IN and HEALTH RISK ASSESSMENT QUESTIONNAIRE:  -completed by phone/video for upcoming Medicare Preventive Visit  PLEASE CHECK FIRST UNDER ROOMING TAB, then QUESTIONNAIRE to see if patient completed online questions. If so use those to complete some of these question ahead of time.   Pre-Visit Check-in: 1)Vitals (height, wt, BP, etc) - record in vitals section for visit on day of visit Request home vitals (wt, BP, etc.) and enter into vitals, THEN update Vital Signs SmartPhrase below at the top of the HPI. See below.  2)Review and Update Medications, Allergies PMH, Surgeries, Social history in Epic 3)Hospitalizations in the last year with date/reason?   4)Review and Update Care Team (patient's specialists) in Epic 5) Complete PHQ9 in Epic  6) Complete Fall Screening in Epic 7)Review all Health Maintenance Due and order under PCP if not done.  Medicare Wellness Patient Questionnaire:  Answer theses question about your habits: Do you drink alcohol? No    Have you ever smoked? no Do you use an illicit drugs? no Do you exercises? Almost every day - and every day with bands and does for upper and lower extremities. Does also squats and lunges holding on and rides bike 8 minutes. Walks some - 500-700 steps a day. Does lots of home exercises - chairs exercises.  He cut sugar out of his diet and processed carbs, trying to do more veggies, chicken, shakes Typical snacks:rarely does snacks, does intermittent fasting  Beverages: water  Answer theses question about you: Can you perform most household chores? No - needs assistance, aides help Do you find it hard to follow a conversation in a noisy room? no Do you often ask people to speak up or repeat themselves? no Do you feel that you have a problem with memory? No  Do you balance your checkbook and or bank acounts? Yes  Do you feel safe at home?yes Last dentist visit? 2 years - plans to schedule Do you need assistance with any of  the following: Please note if so  -   Driving? Doe snot drive anymore  Feeding yourself?  Getting from bed to chair?  Getting to the toilet?  Bathing or showering?  Dressing yourself?  Managing money? No   Climbing a flight of stairs - yes needs assistance  Preparing meals? Yes     Do you have Advanced Directives in place (Living Will, Healthcare Power or Attorney)?  yes   Last eye Exam and location? Has eye exams on regular basis - reports has optic neuritis, stable   Do you currently use prescribed or non-prescribed narcotic or opioid pain medications? no  Do you have a history or close family history of breast, ovarian, tubal or peritoneal cancer or a family member with BRCA (breast cancer susceptibility 1 and 2) gene mutations?     ----------------------------------------------------------------------------------------------------------------------------------------------------------------------------------------------------------------------  Because this visit was a virtual/telehealth visit, some criteria may be missing or patient reported. Any vitals not documented were not able to be obtained and vitals that have been documented are patient reported.    MEDICARE ANNUAL PREVENTIVE CARE VISIT WITH PROVIDER (Welcome to Medicare, initial annual wellness or annual wellness exam)  Virtual Visit Phone Note  I connected with Layke Catrett on 06/17/23  by phone and verified that I am speaking with the correct person using two identifiers.  Location patient: home Location provider:work or home office Persons participating in the virtual visit: patient, provider  Concerns and/or follow up today: reports things are fairly stable, has a number of physical  limitations due MS. Uses wheel chair. Hands and arms are numb. Able to transfer. Has aides to help. Does not drive any longer. Reports BP usually good at home 120s/80s.    See HM section in Epic for other details of completed  HM.    ROS: negative for report of fevers, unintentional weight loss, vision changes, vision loss, hearing loss or change, chest pain, sob, hemoptysis, melena, hematochezia, hematuria, falls, bleeding or bruising, thoughts of suicide or self harm, memory loss  Patient-completed extensive health risk assessment - reviewed and discussed with the patient: See Health Risk Assessment completed with patient prior to the visit either above or in recent phone note. This was reviewed in detailed with the patient today and appropriate recommendations, orders and referrals were placed as needed per Summary below and patient instructions.   Review of Medical History: -PMH, PSH, Family History and current specialty and care providers reviewed and updated and listed below   Patient Care Team: Nelwyn Salisbury, MD as PCP - General (Family Medicine)   Past Medical History:  Diagnosis Date   Hypertension    Multiple sclerosis [facility]) sees Dr. Despina Arias   Vision abnormalities     Past Surgical History:  Procedure Laterality Date   ACHILLES TENDON SURGERY     ANTERIOR CRUCIATE LIGAMENT REPAIR     MENISCUS REPAIR      Social History   Socioeconomic History   Marital status: Divorced    Spouse name: Not on file   Number of children: Not on file   Years of education: Not on file   Highest education level: Bachelor's degree (e.g., BA, AB, BS)  Occupational History   Not on file  Tobacco Use   Smoking status: Never   Smokeless tobacco: Never  Vaping Use   Vaping status: Never Used  Substance and Sexual Activity   Alcohol use: No   Drug use: No   Sexual activity: Not on file  Other Topics Concern   Not on file  Social History Narrative   Not on file   Social Determinants of Health   Financial Resource Strain: Medium Risk (08/14/2022)   Overall Financial Resource Strain (CARDIA)    Difficulty of Paying Living Expenses: Somewhat hard  Food Insecurity: Food Insecurity Present (08/14/2022)    Hunger Vital Sign    Worried About Running Out of Food in the Last Year: Sometimes true    Ran Out of Food in the Last Year: Patient declined  Transportation Needs: No Transportation Needs (08/14/2022)   PRAPARE - Administrator, Civil Service (Medical): No    Lack of Transportation (Non-Medical): No  Physical Activity: Insufficiently Active (08/14/2022)   Exercise Vital Sign    Days of Exercise per Week: 3 days    Minutes of Exercise per Session: 30 min  Stress: No Stress Concern Present (08/14/2022)   [employer] of Occupational Health - Occupational Stress Questionnaire    Feeling of Stress : Only a little  Social Connections: Unknown (08/14/2022)   Social Connection and Isolation Panel [NHANES]    Frequency of Communication with Friends and Family: More than three times a week    Frequency of Social Gatherings with Friends and Family: Patient declined    Attends Religious Services: Patient declined    Database administrator or Organizations: No    Attends Banker Meetings: Not on file    Marital Status: Divorced  Recent Concern: Social Connections - Socially Isolated (06/11/2022)  Social Advertising account executive [NHANES]    Frequency of Communication with Friends and Family: More than three times a week    Frequency of Social Gatherings with Friends and Family: More than three times a week    Attends Religious Services: Never    Database administrator or Organizations: No    Attends Banker Meetings: Never    Marital Status: Divorced  Catering manager Violence: Not At Risk (06/11/2022)   Humiliation, Afraid, Rape, and Kick questionnaire    Fear of Current or Ex-Partner: No    Emotionally Abused: No    Physically Abused: No    Sexually Abused: No    Family History  Problem Relation Age of Onset   Congestive Heart Failure Father    Stroke Maternal Grandfather    Multiple sclerosis Sister     Current Outpatient Medications  on File Prior to Visit  Medication Sig Dispense Refill   Ascorbic Acid (VITA-C PO) Take 1,000 mg by mouth daily.     baclofen (LIORESAL) 10 MG tablet Take 10mg  three times daily as needed 270 tablet 3   Cholecalciferol (DIALYVITE VITAMIN D 5000) 125 MCG (5000 UT) capsule Take 5,000 Units by mouth daily.     Cyanocobalamin (B-12 PO) Take 1 Dose by mouth every other day.     escitalopram (LEXAPRO) 20 MG tablet Take 1 tablet (20 mg total) by mouth daily. 90 tablet 3   MAGNESIUM PO Take 500 mg by mouth daily.     Misc Natural Products (NF FORMULAS TESTOSTERONE) CAPS Take 165 mg by mouth daily.     Nutritional Supplements (JUICE PLUS FIBRE PO) Take 1 capsule by mouth daily.     Omega-3 Fatty Acids (FISH OIL) 1000 MG CAPS Take 1,200 mg by mouth daily.     Probiotic Product (PROBIOTIC PO) Take 3 capsules by mouth daily. Nature Made     Turmeric 500 MG TABS Take 1 Dose by mouth every other day.     UNABLE TO FIND Take 500-1,000 mg by mouth daily. CBD oil     UNABLE TO FIND Take 21 g by mouth. Med Name: Vegan protein powder     UNABLE TO FIND Med Name: [facility] mushroom powder     VUMERITY 231 MG CPDR TAKE 2 CAPSULES (462MG ) BY MOUTH TWICE DAILY, IN THE MORNING AND AT BEDTIME 360 capsule 0   No current facility-administered medications on file prior to visit.    No Known Allergies     Physical Exam Vitals requested from patient and listed below if patient had equipment and was able to obtain at home for this virtual visit: There were no vitals filed for this visit. Estimated body mass index is 22.96 kg/m as calculated from the following:   Height as of 01/09/23: 5\' 8"  (1.727 m).   Weight as of 01/09/23: 151 lb (68.5 kg).  EKG (optional): deferred due to virtual visit  GENERAL: alert, oriented, no acute distress detected; full vision exam deferred due to pandemic and/or virtual encounter   PSYCH/NEURO: pleasant and cooperative, no obvious depression or anxiety, speech and thought processing  grossly intact, Cognitive function grossly intact  Flowsheet Row Clinical Support from 06/11/2022 in [facility]  PHQ-9 Total Score 0           06/11/2022    2:47 PM 06/03/2022    9:44 AM 06/06/2021    1:54 PM 06/06/2021    1:51 PM 06/05/2020    2:17  PM  Depression screen PHQ 2/9  Decreased Interest 0 1 0 0 0  Down, Depressed, Hopeless 0 1 0 0 0  PHQ - 2 Score 0 2 0 0 0  Altered sleeping 0 1   0  Tired, decreased energy 0 0   0  Change in appetite 0 1   0  Feeling bad or failure about yourself  0 0   0  Trouble concentrating 0 1   0  Moving slowly or fidgety/restless 0 0   0  Suicidal thoughts     0  PHQ-9 Score 0 5   0  Difficult doing work/chores     Not difficult at all       06/03/2022    9:44 AM 06/11/2022    2:58 PM 08/06/2022    7:46 AM 06/14/2023   10:38 AM 06/17/2023    4:07 PM  Fall Risk  Falls in the past year? 1 1  1 1   Was there an injury with Fall? 0 0  0 0  Was there an injury with Fall? - Comments  No injury or medical attention needed     Fall Risk Category Calculator 1 2  2 2   Fall Risk Category (Retired) Low Moderate     (RETIRED) Patient Fall Risk Level High fall risk Moderate fall risk Moderate fall risk    Patient at Risk for Falls Due to Impaired balance/gait Impaired mobility   History of fall(s)  Fall risk Follow up Falls evaluation completed Falls prevention discussed        SUMMARY AND PLAN:  Encounter for Medicare annual wellness exam   Discussed applicable health maintenance/preventive health measures and advised and referred or ordered per patient preferences: -declined various methods of colon cancer screening -discussed vaccines due and discuss how he could get each one Health Maintenance  Topic Date Due   COVID-19 Vaccine (1) Never done   DTaP/Tdap/Td (1 - Tdap) Never done   INFLUENZA VACCINE  03/06/2023   Colonoscopy  07/04/2025 (Originally 07/10/2019)   Medicare Annual Wellness (AWV)  06/16/2024    Hepatitis C Screening  Completed   HIV Screening  Completed   HPV VACCINES  Aged [employer] and counseling on the following was provided based on the above review of health and a plan/checklist for the patient, along with additional information discussed, was provided for the patient in the patient instructions :   ance and discussed exercise guidelines for adults with include balance exercises at least 3 days per week.  -Advised and counseled on a healthy lifestyle - including the importance of a healthy diet, regular physical activity, social connections and stress management. -discussed safe ways to exercise to prevent falls -Reviewed patient's current diet. Advised and counseled on a whole foods based healthy diet. A summary of a healthy diet was provided in the Patient Instructions. Congratulated on changes. -reviewed patient's current physical activity level and discussed exercise guidelines for adults. Discussed resources and ideas for safe exercise at home to assist in meeting exercise guideline recommendations in a safe and healthy way. Congratulated him on the exercise he has been doing - he feels has really helped his MS.  -Advise yearly dental visits at minimum and regular eye exams -counseled on sleep, icbt, sleep hygiene.   Follow up: see patient instructions   Patient Instructions  I really enjoyed getting to talk with you today! I am available on Tuesdays and Thursdays for virtual visits if you have any questions  or concerns, or if I can be of any further assistance.   CHECKLIST FROM ANNUAL WELLNESS VISIT:  -Follow up (please call to schedule if not scheduled after visit):   -yearly for annual wellness visit with primary care office  Here is a list of your preventive care/health maintenance measures and the plan for each if any are due:  PLAN For any measures below that may be due:  -can get vaccines at the pharmacy if you decide to get  Health Maintenance   Topic Date Due   COVID-19 Vaccine (1) Never done   DTaP/Tdap/Td (1 - Tdap) Never done   INFLUENZA VACCINE  03/06/2023   Colonoscopy  07/04/2025 (Originally 07/10/2019)   Medicare Annual Wellness (AWV)  06/16/2024   Hepatitis C Screening  Completed   HIV Screening  Completed   HPV VACCINES  Aged Out    -See a dentist at least yearly  -Get your eyes checked and then per your eye specialist's recommendations  -Other issues addressed today:   -I have included below further information regarding a healthy whole foods based diet, physical activity guidelines for adults, stress management and opportunities for social connections. I hope you find this information useful.   -----------------------------------------------------------------------------------------------------------------------------------------------------------------------------------------------------------------------------------------------------------  NUTRITION: -eat real food: lots of colorful vegetables (half the plate) and fruits -5-7 servings of vegetables and fruits per day (fresh or steamed is best), exp. 2 servings of vegetables with lunch and dinner and 2 servings of fruit per day. Berries and greens such as kale and collards are great choices.  -consume on a regular basis: whole grains (make sure first ingredient on label contains the word "whole"), fresh fruits, fish, nuts, seeds, healthy oils (such as olive oil, avocado oil, grape seed oil) -may eat small amounts of dairy and lean meat on occasion, but avoid processed meats such as ham, bacon, lunch meat, etc. -drink water -try to avoid fast food and pre-packaged foods, processed meat -most experts advise limiting sodium to < 2300mg  per day, should limit further is any chronic conditions such as high blood pressure, heart disease, diabetes, etc. The American Heart Association advised that < 1500mg  is is ideal -try to avoid foods that contain any ingredients with  names you do not recognize  -try to avoid sugar/sweets (except for the natural sugar that occurs in fresh fruit) -try to avoid sweet drinks -try to avoid white rice, white bread, pasta (unless whole grain), white or yellow potatoes  EXERCISE GUIDELINES FOR ADULTS: -if you wish to increase your physical activity, do so gradually and with the approval of your doctor -STOP and seek medical care immediately if you have any chest pain, chest discomfort or trouble breathing when starting or increasing exercise  -move and stretch your body, legs, feet and arms when sitting for long periods -Physical activity guidelines for optimal health in adults: -least 150 minutes per week of aerobic exercise (can talk, but not sing) once approved by your doctor, 20-30 minutes of sustained activity or two 10 minute episodes of sustained activity every day.  -resistance training at least 2 days per week if approved by your doctor -balance exercises 3+ days per week:   Stand somewhere where you have something sturdy to hold onto to support yourself and prevent fall if you lose balance.    1) lift up on toes or shift wt to front of feet, start with 5x per day and work up to 20x   2) stand and lift one leg straight out to the side  so that foot is a few inches of the floor, start with 5x each side and work up to 20x each side   3) stand on one foot, start with 5 seconds each side and work up to 20 seconds on each side  If you need ideas or help with getting more active:  -Silver sneakers https://tools.silversneakers.com  -Walk with a Doc: http://www.duncan-williams.com/  -try to include resistance (weight lifting/strength building) and balance exercises twice per week: or the following link for ideas: http://castillo-powell.com/  BuyDucts.dk  STRESS MANAGEMENT: -can try meditating, or just sitting quietly with deep breathing  while intentionally relaxing all parts of your body for 5 minutes daily -if you need further help with stress, anxiety or depression please follow up with your primary doctor or contact the wonderful folks at [facility]: [phone]  SOCIAL CONNECTIONS: -options in [place] if you wish to engage in more social and exercise related activities:  -Silver sneakers https://tools.silversneakers.com  -Walk with a Doc: http://www.duncan-williams.com/  -Check out the [facility] Active Adults 50+ section on the [place] of [employer] (hiking clubs, book clubs, cards and games, chess, exercise classes, aquatic classes and much more) - see the website for details: https://www.[place].gov/departments/parks-recreation/active-adults50  -YouTube has lots of exercise videos for different ages and abilities as well  -[facility] (a variety of indoor and outdoor inperson activities for adults). [phone]. [address]  -Virtual Online Classes (a variety of topics): see seniorplanet.org or call [phone]  -consider volunteering at a school, hospice center, church, senior center or elsewhere    FOR IMPROVED SLEEP AND TO RESET YOUR SLEEP SCHEDULE:  []  Schedule sleep counseling(cognitive behavioral therapy).   [facility] is a good option.   Call for appointment: [phone]  []  Exercise 30 minutes daily. Some people do better with exercise in the morning, others do better exercising later in the day.  []  Avoid caffeine and alcohol - particularly in the evenings. For some people even a little alcohol can impair sleep.   []  Go to bed and wake up at the same time everyday (within a 30 minute window). When you get up in the morning for your designated wake time - turn on lights and open curtains right away. This will help to set your internal sleep clock.   []  Keep bedroom cool, dark and quiet - if you have to get up at night make sure there  is no white light from street lamps, night lights, lights, clock, devices etc. that enters your eyes. Instead, use red light flashlight or night lights and avoid looking directly at the light while up.   []  Set 1-2 hour bedtime routine, dim lights, avoid screens (phones, computers, TVs, etc during this time), consider sleepytime tea, warm bath or shower, avoid alcohol and caffeine  []  Reserve bed for sleep - do not read, watch TV, look at phone or device, etc., in bed.  []  If you toss and turn for more then 10-15 minutes, get out of bed and list or journal thoughts or do quiet activity (not screen-time, no tv, phone, computer) then go back to bed. Repeat as needed. Try not to worry about when you will eventually fall asleep.  []  Seek help for any depression or anxiety.  [] Prescription strength sleep medications should only be used in severe cases of insomnia if other measures fail and should be used sparingly.  I hope you are feeling better soon!         Terressa Koyanagi, DO

## 2023-06-17 NOTE — Patient Instructions (Addendum)
I really enjoyed getting to talk with you today! I am available on Tuesdays and Thursdays for virtual visits if you have any questions or concerns, or if I can be of any further assistance.   CHECKLIST FROM ANNUAL WELLNESS VISIT:  -Follow up (please call to schedule if not scheduled after visit):   -yearly for annual wellness visit with primary care office  Here is a list of your preventive care/health maintenance measures and the plan for each if any are due:  PLAN For any measures below that may be due:  -can get vaccines at the pharmacy if you decide to get  Health Maintenance  Topic Date Due   COVID-19 Vaccine (1) Never done   DTaP/Tdap/Td (1 - Tdap) Never done   INFLUENZA VACCINE  03/06/2023   Colonoscopy  07/04/2025 (Originally 07/10/2019)   Medicare Annual Wellness (AWV)  06/16/2024   Hepatitis C Screening  Completed   HIV Screening  Completed   HPV VACCINES  Aged Out    -See a dentist at least yearly  -Get your eyes checked and then per your eye specialist's recommendations  -Other issues addressed today:   -I have included below further information regarding a healthy whole foods based diet, physical activity guidelines for adults, stress management and opportunities for social connections. I hope you find this information useful.   -----------------------------------------------------------------------------------------------------------------------------------------------------------------------------------------------------------------------------------------------------------  NUTRITION: -eat real food: lots of colorful vegetables (half the plate) and fruits -5-7 servings of vegetables and fruits per day (fresh or steamed is best), exp. 2 servings of vegetables with lunch and dinner and 2 servings of fruit per day. Berries and greens such as kale and collards are great choices.  -consume on a regular basis: whole grains (make sure first ingredient on label contains  the word "whole"), fresh fruits, fish, nuts, seeds, healthy oils (such as olive oil, avocado oil, grape seed oil) -may eat small amounts of dairy and lean meat on occasion, but avoid processed meats such as ham, bacon, lunch meat, etc. -drink water -try to avoid fast food and pre-packaged foods, processed meat -most experts advise limiting sodium to < 2300mg  per day, should limit further is any chronic conditions such as high blood pressure, heart disease, diabetes, etc. The American Heart Association advised that < 1500mg  is is ideal -try to avoid foods that contain any ingredients with names you do not recognize  -try to avoid sugar/sweets (except for the natural sugar that occurs in fresh fruit) -try to avoid sweet drinks -try to avoid white rice, white bread, pasta (unless whole grain), white or yellow potatoes  EXERCISE GUIDELINES FOR ADULTS: -if you wish to increase your physical activity, do so gradually and with the approval of your doctor -STOP and seek medical care immediately if you have any chest pain, chest discomfort or trouble breathing when starting or increasing exercise  -move and stretch your body, legs, feet and arms when sitting for long periods -Physical activity guidelines for optimal health in adults: -least 150 minutes per week of aerobic exercise (can talk, but not sing) once approved by your doctor, 20-30 minutes of sustained activity or two 10 minute episodes of sustained activity every day.  -resistance training at least 2 days per week if approved by your doctor -balance exercises 3+ days per week:   Stand somewhere where you have something sturdy to hold onto to support yourself and prevent fall if you lose balance.    1) lift up on toes or shift wt to front of feet,  start with 5x per day and work up to 20x   2) stand and lift one leg straight out to the side so that foot is a few inches of the floor, start with 5x each side and work up to 20x each side   3) stand  on one foot, start with 5 seconds each side and work up to 20 seconds on each side  If you need ideas or help with getting more active:  -Silver sneakers https://tools.silversneakers.com  -Walk with a Doc: http://www.duncan-williams.com/  -try to include resistance (weight lifting/strength building) and balance exercises twice per week: or the following link for ideas: http://castillo-powell.com/  BuyDucts.dk  STRESS MANAGEMENT: -can try meditating, or just sitting quietly with deep breathing while intentionally relaxing all parts of your body for 5 minutes daily -if you need further help with stress, anxiety or depression please follow up with your primary doctor or contact the wonderful folks at [facility]: [phone]  SOCIAL CONNECTIONS: -options in [place] if you wish to engage in more social and exercise related activities:  -Silver sneakers https://tools.silversneakers.com  -Walk with a Doc: http://www.duncan-williams.com/  -Check out the [facility] Active Adults 50+ section on the [place] of [employer] (hiking clubs, book clubs, cards and games, chess, exercise classes, aquatic classes and much more) - see the website for details: https://www.[place].gov/departments/parks-recreation/active-adults50  -YouTube has lots of exercise videos for different ages and abilities as well  -[facility] (a variety of indoor and outdoor inperson activities for adults). [phone]. [address].  -Virtual Online Classes (a variety of topics): see seniorplanet.org or call ([phone]  -consider volunteering at a school, hospice center, church, senior center or elsewhere    FOR IMPROVED SLEEP AND TO RESET YOUR SLEEP SCHEDULE:  []  Schedule sleep counseling(cognitive behavioral therapy).   [facility] is a good option.   Call for  appointment: [phone]  []  Exercise 30 minutes daily. Some people do better with exercise in the morning, others do better exercising later in the day.  []  Avoid caffeine and alcohol - particularly in the evenings. For some people even a little alcohol can impair sleep.   []  Go to bed and wake up at the same time everyday (within a 30 minute window). When you get up in the morning for your designated wake time - turn on lights and open curtains right away. This will help to set your internal sleep clock.   []  Keep bedroom cool, dark and quiet - if you have to get up at night make sure there is no white light from street lamps, night lights, lights, clock, devices etc. that enters your eyes. Instead, use red light flashlight or night lights and avoid looking directly at the light while up.   []  Set 1-2 hour bedtime routine, dim lights, avoid screens (phones, computers, TVs, etc during this time), consider sleepytime tea, warm bath or shower, avoid alcohol and caffeine  []  Reserve bed for sleep - do not read, watch TV, look at phone or device, etc., in bed.  []  If you toss and turn for more then 10-15 minutes, get out of bed and list or journal thoughts or do quiet activity (not screen-time, no tv, phone, computer) then go back to bed. Repeat as needed. Try not to worry about when you will eventually fall asleep.  []  Seek help for any depression or anxiety.  [] Prescription strength sleep medications should only be used in severe cases of insomnia if other measures fail and should  be used sparingly.  I hope you are feeling better soon!

## 2023-06-17 NOTE — Telephone Encounter (Signed)
Pt called to say he is still waiting for DO to join visit - Laurel Dimmer, DO.  (Appointment time - 4:20 pm)

## 2023-06-30 DIAGNOSIS — R531 Weakness: Secondary | ICD-10-CM | POA: Diagnosis not present

## 2023-06-30 DIAGNOSIS — R261 Paralytic gait: Secondary | ICD-10-CM | POA: Diagnosis not present

## 2023-06-30 DIAGNOSIS — G35 Multiple sclerosis: Secondary | ICD-10-CM | POA: Diagnosis not present

## 2023-07-22 ENCOUNTER — Ambulatory Visit: Payer: Medicare Other | Admitting: Neurology

## 2023-07-22 ENCOUNTER — Encounter: Payer: Self-pay | Admitting: Neurology

## 2023-07-22 VITALS — BP 148/78 | HR 79 | Ht 68.0 in

## 2023-07-22 DIAGNOSIS — Z79899 Other long term (current) drug therapy: Secondary | ICD-10-CM | POA: Diagnosis not present

## 2023-07-22 DIAGNOSIS — G35 Multiple sclerosis: Secondary | ICD-10-CM | POA: Diagnosis not present

## 2023-07-22 DIAGNOSIS — H5121 Internuclear ophthalmoplegia, right eye: Secondary | ICD-10-CM

## 2023-07-22 DIAGNOSIS — R39198 Other difficulties with micturition: Secondary | ICD-10-CM | POA: Diagnosis not present

## 2023-07-22 DIAGNOSIS — R252 Cramp and spasm: Secondary | ICD-10-CM | POA: Diagnosis not present

## 2023-07-22 DIAGNOSIS — M549 Dorsalgia, unspecified: Secondary | ICD-10-CM

## 2023-07-22 DIAGNOSIS — R269 Unspecified abnormalities of gait and mobility: Secondary | ICD-10-CM

## 2023-07-22 DIAGNOSIS — Z8669 Personal history of other diseases of the nervous system and sense organs: Secondary | ICD-10-CM

## 2023-07-22 NOTE — Progress Notes (Signed)
[facility]  PATIENT: Alexander [name] DOB: 08-May-1976  REFERRING DOCTOR OR PCP:  Dr. Eleonore Chiquito (PCP)  _________________________________   HISTORICAL  CHIEF COMPLAINT:  Chief Complaint  Patient presents with   Follow-up    Pt in room 10 alone. Here for MS follow up.Pt said MS is stable.  Pt said his hand has increased numbness.     HISTORY OF PRESENT ILLNESS:  Alexander [name] is a 47 y.o. man with a relapsing/active form of secondary progressive multiple sclerosis diagnosed in 2003.     Update 07/22/2023 He stopped Vumerity and does not want to go back on a DMT.   He has no exacerbations.   He had side effects including severe muscle tightness and pain (right knee hyperextension, intense back spasms, hand pain), IBS, increased urinary incontinence and suicidal thoughts. All side effects have subsided since stopping. .   I have strongly urged him to get on a different DMT.   Gait and balance are poor.  He uses a walker both inside and outside for short distance.  He can go  > 100 feet.   Has some falls, mostly backwards.  He does > 1000 steps a day.    He does worse in the heat.  Left arm is worse still but right hand seems to be catching up with more weakness.   Left > right legs have spasticity, worse at night. He takes baclofen prn and CBD daily which helps a little.  Some left tremors.  Higher dose of baclofen caused weakness.  He has poor coordination, left > right    He has bladder urgencys.  Vision is doing the same with sequela of right optic neuritis - blurry and reduced colors.    [name]     He feels fatigue is mostly stable.  He does worse in heat.  [name]   He still takes a nap daily.   He sleeps 6-8 hours at night.  Sometimes he is not refreshed in the morning.  He feels depression better than last year.  He is sometimes irritable.   Cognition is doing well.   He was hospitalized in January 2023 for Covid and felt a lot weaker.   After fluids and a couple  days he was much better.   He did PT with benefit.   MRi report mentioned one additional midbrain lesion but I see no new lesions.   Multiple cervical spine lesions look stable.    MS history: In 2003 he had optic neuritis started on the left and then also on the right. He had an MRI of the brain performed at that time and was told that it was normal. He had follow-up MRIs every year or 2 for several more years but they continue to be normal. In 2012, he noticed worsening issues with gait. He had had some difficulty with the gait for the previous couple of years but due to orthopedic issues a neurologic cause was not looked into. An MRI of the cervical spine showed multiple plaques consistent with MS. Initially, he was placed on Copaxone. Although he tolerated Copaxone well he had some breakthrough disease with a new spot developing in the brain. Therefore he was switched to Tysabri. He tolerated Tysabri well and his MS was well controlled. However, she was JCV antibody positive a decision was made to stop Tysabri after 2 years. Years ago he went on Gilenya. He has tolerated it well and he did better on Tysabri. He was seeing  Dr. Carroll Sage at [facility] on [address]. He had Ocrevus August 2017.  He switched from Ocrevus to Vumerity January 2021 due to continued progression and fatigue while on Ocrevus.      MRI images MRI of the brain 09/21/2019 showed multiple T2/FLAIR hyperintense foci, mostly in the infratentorial white matter in a pattern and configuration consistent with chronic demyelinating plaque associated with multiple sclerosis.  None of the foci enhances or appears to be acute.  Compared to the MRI dated 02/18/2018, there are no new lesions.  MRI of the cervical spine 09/21/2019 showed patchy hyperintense signal within the spinal cord from C2-C6.  Another small focus is noted at C7-T1 to the right.  Compared to the MRI from 02/18/2018, there are no new lesions.  Additionally, there  are multilevel degenerative changes, mostly mild.  No definite nerve root compression or spinal stenosis.  MRI of the cervical spine 08/22/2022 showed T2 hyperintense signal from C2 to C6-C7 and again at C7 - T1. None of the foci enhance. Compared to the MRI from 08/09/2021, there did not appear to be any new lesions there are degenerative changes in the cervical spine with mild spinal stenosis at C5-C6 and various degrees of foraminal narrowing.  No nerve root compression.  MRI of the brain 08/22/2022 showed Scattered T2/FLAIR hyperintense foci in the brainstem, cerebellum and cerebral hemispheres in a pattern consistent with chronic demyelinating plaque associated with multiple sclerosis. None of the foci appear to be acute. They do not enhance. Compared to the MRI from 08/09/2021, there are no new lesions.      REVIEW OF SYSTEMS: Constitutional: No fevers, chills, sweats, or change in appetite.   Reports minimal fatigue. Eyes: No visual changes, double vision, eye pain Ear, nose and throat: No hearing loss, ear pain, nasal congestion, sore throat Cardiovascular: No chest pain, palpitations Respiratory:  No shortness of breath at rest or with exertion.   No wheezes GastrointestinaI: No nausea, vomiting, diarrhea, abdominal pain, fecal incontinence Genitourinary:  reports frequency, hesitancy and nocturia. Musculoskeletal:  No neck pain, back pain Integumentary: No rash, pruritus, skin lesions Neurological: as above Psychiatric: No depression at this time.  No anxiety Endocrine: No palpitations, diaphoresis, change in appetite, change in weigh or increased thirst Hematologic/Lymphatic:  No anemia, purpura, petechiae. Allergic/Immunologic: No itchy/runny eyes, nasal congestion, recent allergic reactions, rashes  ALLERGIES: No Known Allergies  HOME MEDICATIONS:  Current Outpatient Medications:    Ascorbic Acid (VITA-C PO), Take 1,000 mg by mouth daily., Disp: , Rfl:    baclofen (LIORESAL) 10  MG tablet, Take 10mg  three times daily as needed, Disp: 270 tablet, Rfl: 3   Cholecalciferol (DIALYVITE VITAMIN D 5000) 125 MCG (5000 UT) capsule, Take 5,000 Units by mouth daily., Disp: , Rfl:    Cyanocobalamin (B-12 PO), Take 1 Dose by mouth every other day., Disp: , Rfl:    escitalopram (LEXAPRO) 20 MG tablet, Take 1 tablet (20 mg total) by mouth daily., Disp: 90 tablet, Rfl: 3   MAGNESIUM PO, Take 500 mg by mouth daily., Disp: , Rfl:    Misc Natural Products (NF FORMULAS TESTOSTERONE) CAPS, Take 165 mg by mouth daily., Disp: , Rfl:    Nutritional Supplements (JUICE PLUS FIBRE PO), Take 1 capsule by mouth daily., Disp: , Rfl:    Omega-3 Fatty Acids (FISH OIL) 1000 MG CAPS, Take 1,200 mg by mouth daily., Disp: , Rfl:    Probiotic Product (PROBIOTIC PO), Take 3 capsules by mouth daily. Nature Made, Disp: , Rfl:  Turmeric 500 MG TABS, Take 1 Dose by mouth every other day., Disp: , Rfl:    UNABLE TO FIND, Take 500-1,000 mg by mouth daily. CBD oil, Disp: , Rfl:    UNABLE TO FIND, Take 21 g by mouth. Med Name: Vegan protein powder, Disp: , Rfl:    UNABLE TO FIND, Med Name: [facility] mushroom powder, Disp: , Rfl:    VUMERITY 231 MG CPDR, TAKE 2 CAPSULES (462MG ) BY MOUTH TWICE DAILY, IN THE MORNING AND AT BEDTIME, Disp: 360 capsule, Rfl: 0  PAST MEDICAL HISTORY: Past Medical History:  Diagnosis Date   Hypertension    Multiple sclerosis (HCC) sees Dr. Despina Arias   Vision abnormalities     PAST SURGICAL HISTORY: Past Surgical History:  Procedure Laterality Date   ACHILLES TENDON SURGERY     ANTERIOR CRUCIATE LIGAMENT REPAIR     MENISCUS REPAIR      FAMILY HISTORY: Family History  Problem Relation Age of Onset   Congestive Heart Failure Father    Stroke Maternal Grandfather    Multiple sclerosis Sister     SOCIAL HISTORY:  Social History   Socioeconomic History   Marital status: Divorced    Spouse name: Not on file   Number of children: Not on file   Years of education:  Not on file   Highest education level: Bachelor's degree (e.g., BA, AB, BS)  Occupational History   Not on file  Tobacco Use   Smoking status: Never   Smokeless tobacco: Never  Vaping Use   Vaping status: Never Used  Substance and Sexual Activity   Alcohol use: No   Drug use: No   Sexual activity: Not on file  Other Topics Concern   Not on file  Social History Narrative   Not on file   Social Drivers of Health   Financial Resource Strain: Medium Risk (08/14/2022)   Overall Financial Resource Strain (CARDIA)    Difficulty of Paying Living Expenses: Somewhat hard  Food Insecurity: Food Insecurity Present (08/14/2022)   Hunger Vital Sign    Worried About Running Out of Food in the Last Year: Sometimes true    Ran Out of Food in the Last Year: Patient declined  Transportation Needs: No Transportation Needs (08/14/2022)   PRAPARE - Administrator, Civil Service (Medical): No    Lack of Transportation (Non-Medical): No  Physical Activity: Insufficiently Active (08/14/2022)   Exercise Vital Sign    Days of Exercise per Week: 3 days    Minutes of Exercise per Session: 30 min  Stress: No Stress Concern Present (08/14/2022)   [employer] of Occupational Health - Occupational Stress Questionnaire    Feeling of Stress : Only a little  Social Connections: Unknown (08/14/2022)   Social Connection and Isolation Panel [NHANES]    Frequency of Communication with Friends and Family: More than three times a week    Frequency of Social Gatherings with Friends and Family: Patient declined    Attends Religious Services: Patient declined    Database administrator or Organizations: No    Attends Engineer, structural: Not on file    Marital Status: Divorced  Recent Concern: Social Connections - Socially Isolated (06/11/2022)   Social Connection and Isolation Panel [NHANES]    Frequency of Communication with Friends and Family: More than three times a week    Frequency of  Social Gatherings with Friends and Family: More than three times a week    Attends Religious  Services: Never    Active Member of Clubs or Organizations: No    Attends Banker Meetings: Never    Marital Status: Divorced  Catering manager Violence: Not At Risk (06/11/2022)   Humiliation, Afraid, Rape, and Kick questionnaire    Fear of Current or Ex-Partner: No    Emotionally Abused: No    Physically Abused: No    Sexually Abused: No     PHYSICAL EXAM  Vitals:   07/22/23 1054 07/22/23 1057  BP: (!) 173/84 (!) 148/78  Pulse: 79   Height: 5\' 8"  (1.727 m)      Body mass index is 22.96 kg/m.   General: The patient is well-developed and well-nourished and in no acute distress.      Neurologic Exam  Mental status: The patient is alert and oriented x 3 at the time of the examination. The patient has apparent normal recent and remote memory, with an apparently normal attention span and concentration ability.   Speech is normal.  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 Facial strength and sensation and trapezius strength were normal.  Hearing appeared to be normal and symmetric.  Motor:  Muscle bulk is normal.  Muscle tone is increased in the legs, left greater than right and in the left arm.   Right arm has normal tone.   He has 4+/5 strength in the proximal left leg and 4-/5 distal.  Has 4/5 prox right and distal right strength.  Reduced RAM in left ar and both legs (worse on left)  Sensory: He has reduced sensation to vibration in the left leg.  Touch sensation was more symmetric.  Coordination: Finger-nose-finger is performed slowly on the left with mild tremor.  Heel-to-shin is performed poorly on the right and he cannot do on the left.  Gait and station: Station requires support.  He has difficulty taking steps without a walker.  Romberg is positive.    Reflexes:  Deep tendon reflexes are symmetric and increased in legs,with spread at both knees (worse on left) and nonsustained clonus at left ankle.          ASSESSMENT AND PLAN    1. Multiple sclerosis (HCC)   2. High risk medication use   3. Internuclear ophthalmoplegia of right eye   4. Urinary dysfunction   5. History of optic neuritis   6. Spasticity     1.   He stopped Vumerity and is thinking about not going on any DMT.  I strongly urged him to reconsider.  We discussed Mavenclad and he may be interested.  I went over the risks and benefits..  We had checked for chronic infections at his last visit and I will recheck a CBC with differential and liver function test.  We will also check MRI of the brain and cervical spine to determine if he is having subclinical activity while off medication. 2.   Continue to be active and exercise as tolerated.      Continue Vit D (2000 U a day ok level was 88 in early 2024) 3.   Continue Lexapro. 4.   TPI 80 mg Depo-Medrol in 4 cc Marcaine into the T4-T6 paraspinal muscles bilaterally using sterile technique.  He tolerated it well. 5.   He will return to see [place]  in 4 months for a regular visit or call sooner if there are new or worsening neurologic symptoms.  This visit is part of a comprehensive longitudinal care medical relationship regarding the patients primary diagnosis of MS and related concerns.      Noal Abshier A. Epimenio Foot, MD, PhD, FAAN Certified in Neurology, Clinical Neurophysiology, Sleep Medicine, Pain Medicine and Neuroimaging Director, [facility] at [facility]  [facility] [address], [address] [place], [place] [address] [phone]

## 2023-07-23 LAB — CBC WITH DIFFERENTIAL/PLATELET
Basophils Absolute: 0.1 10*3/uL (ref 0.0–0.2)
Basos: 2 %
EOS (ABSOLUTE): 0.3 10*3/uL (ref 0.0–0.4)
Eos: 8 %
Hematocrit: 46 % (ref 37.5–51.0)
Hemoglobin: 15.4 g/dL (ref 13.0–17.7)
Immature Grans (Abs): 0 10*3/uL (ref 0.0–0.1)
Immature Granulocytes: 0 %
Lymphocytes Absolute: 0.8 10*3/uL (ref 0.7–3.1)
Lymphs: 19 %
MCH: 29.2 pg (ref 26.6–33.0)
MCHC: 33.5 g/dL (ref 31.5–35.7)
MCV: 87 fL (ref 79–97)
Monocytes Absolute: 0.4 10*3/uL (ref 0.1–0.9)
Monocytes: 9 %
Neutrophils Absolute: 2.5 10*3/uL (ref 1.4–7.0)
Neutrophils: 62 %
Platelets: 168 10*3/uL (ref 150–450)
RBC: 5.27 x10E6/uL (ref 4.14–5.80)
RDW: 12.6 % (ref 11.6–15.4)
WBC: 4.1 10*3/uL (ref 3.4–10.8)

## 2023-07-23 LAB — HEPATIC FUNCTION PANEL
ALT: 17 [IU]/L (ref 0–44)
AST: 16 [IU]/L (ref 0–40)
Albumin: 4.3 g/dL (ref 4.1–5.1)
Alkaline Phosphatase: 50 [IU]/L (ref 44–121)
Bilirubin Total: 0.8 mg/dL (ref 0.0–1.2)
Bilirubin, Direct: 0.24 mg/dL (ref 0.00–0.40)
Total Protein: 6.1 g/dL (ref 6.0–8.5)

## 2023-07-24 ENCOUNTER — Encounter: Payer: Self-pay | Admitting: Neurology

## 2023-07-29 ENCOUNTER — Telehealth: Payer: Self-pay | Admitting: Neurology

## 2023-07-29 NOTE — Telephone Encounter (Signed)
[employer] medicare Berkley Harvey: [phone] exp. 07/29/23-08/27/23 sent to GI [phone]

## 2023-08-19 DIAGNOSIS — H5213 Myopia, bilateral: Secondary | ICD-10-CM | POA: Diagnosis not present

## 2023-08-19 DIAGNOSIS — H47291 Other optic atrophy, right eye: Secondary | ICD-10-CM | POA: Diagnosis not present

## 2023-08-21 ENCOUNTER — Encounter: Payer: Medicare Other | Admitting: Family Medicine

## 2023-08-28 ENCOUNTER — Encounter: Payer: Self-pay | Admitting: Neurology

## 2023-09-09 ENCOUNTER — Ambulatory Visit
Admission: RE | Admit: 2023-09-09 | Discharge: 2023-09-09 | Disposition: A | Discharge status: Home / Self Care | Payer: Medicare Other | Source: Ambulatory Visit | Attending: Neurology | Admitting: Neurology

## 2023-09-09 ENCOUNTER — Encounter: Payer: Self-pay | Admitting: Neurology

## 2023-09-09 DIAGNOSIS — G35 Multiple sclerosis: Secondary | ICD-10-CM

## 2023-09-09 DIAGNOSIS — R269 Unspecified abnormalities of gait and mobility: Secondary | ICD-10-CM

## 2023-09-09 MED ORDER — GADOPICLENOL 0.5 MMOL/ML IV SOLN
7.5000 mL | Freq: Once | INTRAVENOUS | Status: AC | PRN
  Administered 2023-09-09: 7 mL via INTRAVENOUS

## 2023-09-29 NOTE — Telephone Encounter (Signed)
 Called pt since he had not read mychart message yet. He declined starting on Mavenclad at this time. He will call in future if he changes his mind.

## 2023-10-13 ENCOUNTER — Encounter: Payer: Self-pay | Admitting: Family Medicine

## 2023-10-13 ENCOUNTER — Ambulatory Visit (INDEPENDENT_AMBULATORY_CARE_PROVIDER_SITE_OTHER): Admitting: Family Medicine

## 2023-10-13 VITALS — BP 130/82 | HR 78 | Temp 97.8°F | Ht 68.0 in | Wt 158.4 lb

## 2023-10-13 DIAGNOSIS — G35 Multiple sclerosis: Secondary | ICD-10-CM | POA: Diagnosis not present

## 2023-10-13 DIAGNOSIS — R251 Tremor, unspecified: Secondary | ICD-10-CM | POA: Diagnosis not present

## 2023-10-13 DIAGNOSIS — R39198 Other difficulties with micturition: Secondary | ICD-10-CM

## 2023-10-13 DIAGNOSIS — G373 Acute transverse myelitis in demyelinating disease of central nervous system: Secondary | ICD-10-CM

## 2023-10-13 DIAGNOSIS — Z8669 Personal history of other diseases of the nervous system and sense organs: Secondary | ICD-10-CM | POA: Diagnosis not present

## 2023-10-13 LAB — LIPID PANEL
Cholesterol: 188 mg/dL (ref 0–200)
HDL: 51.2 mg/dL (ref >39.00)
LDL Cholesterol: 123 mg/dL — ABNORMAL HIGH (ref 0–99)
NonHDL: 136.81
Total CHOL/HDL Ratio: 4
Triglycerides: 68 mg/dL (ref 0.0–149.0)
VLDL: 13.6 mg/dL (ref 0.0–40.0)

## 2023-10-13 LAB — BASIC METABOLIC PANEL
BUN: 17 mg/dL (ref 6–23)
CO2: 29 meq/L (ref 19–32)
Calcium: 9.3 mg/dL (ref 8.4–10.5)
Chloride: 104 meq/L (ref 96–112)
Creatinine, Ser: 1.09 mg/dL (ref 0.40–1.50)
GFR: 79.83 mL/min (ref >60.00)
Glucose, Bld: 87 mg/dL (ref 70–99)
Potassium: 4 meq/L (ref 3.5–5.1)
Sodium: 140 meq/L (ref 135–145)

## 2023-10-13 LAB — CBC WITH DIFFERENTIAL/PLATELET
Basophils Absolute: 0.1 10*3/uL (ref 0.0–0.1)
Basophils Relative: 2.1 % (ref 0.0–3.0)
Eosinophils Absolute: 0.3 10*3/uL (ref 0.0–0.7)
Eosinophils Relative: 6 % — ABNORMAL HIGH (ref 0.0–5.0)
HCT: 45.1 % (ref 39.0–52.0)
Hemoglobin: 15.4 g/dL (ref 13.0–17.0)
Lymphocytes Relative: 19.8 % (ref 12.0–46.0)
Lymphs Abs: 0.9 10*3/uL (ref 0.7–4.0)
MCHC: 34.3 g/dL (ref 30.0–36.0)
MCV: 87.3 fl (ref 78.0–100.0)
Monocytes Absolute: 0.4 10*3/uL (ref 0.1–1.0)
Monocytes Relative: 8 % (ref 3.0–12.0)
Neutro Abs: 2.8 10*3/uL (ref 1.4–7.7)
Neutrophils Relative %: 64.1 % (ref 43.0–77.0)
Platelets: 160 10*3/uL (ref 150.0–400.0)
RBC: 5.17 Mil/uL (ref 4.22–5.81)
RDW: 13.2 % (ref 11.5–15.5)
WBC: 4.4 10*3/uL (ref 4.0–10.5)

## 2023-10-13 LAB — C-REACTIVE PROTEIN: CRP: <1 mg/dL (ref 0.5–20.0)

## 2023-10-13 LAB — HEPATIC FUNCTION PANEL
ALT: 16 U/L (ref 0–53)
AST: 15 U/L (ref 0–37)
Albumin: 4.4 g/dL (ref 3.5–5.2)
Alkaline Phosphatase: 41 U/L (ref 39–117)
Bilirubin, Direct: 0.1 mg/dL (ref 0.0–0.3)
Total Bilirubin: 0.7 mg/dL (ref 0.2–1.2)
Total Protein: 6.6 g/dL (ref 6.0–8.3)

## 2023-10-13 LAB — VITAMIN D 25 HYDROXY (VIT D DEFICIENCY, FRACTURES): VITD: 78.98 ng/mL (ref 30.00–100.00)

## 2023-10-13 LAB — SEDIMENTATION RATE: Sed Rate: 6 mm/h (ref 0–15)

## 2023-10-13 LAB — HEMOGLOBIN A1C: Hgb A1c MFr Bld: 4.6 % (ref 4.6–6.5)

## 2023-10-13 LAB — TSH: TSH: 1.41 u[IU]/mL (ref 0.35–5.50)

## 2023-10-13 LAB — VITAMIN B12: Vitamin B-12: 1135 pg/mL — ABNORMAL HIGH (ref 211–911)

## 2023-10-13 LAB — MAGNESIUM: Magnesium: 2 mg/dL (ref 1.5–2.5)

## 2023-10-13 LAB — TESTOSTERONE: Testosterone: 211 ng/dL — ABNORMAL LOW (ref 300.00–890.00)

## 2023-10-13 NOTE — Progress Notes (Signed)
 Subjective:    Patient ID: Alexander [name], male    DOB: 09/09/1975, 48 y.o.   MRN: [id number]  HPI Here to follow up on issues. He is pleased with his current condition overall. He had taken medications for his MS for 5 years, and his condition had stabilized, so he stopped them about 9 months ago. Since then he has continued to maintain his condition with no disease progression. He is taking PT he continues to see Dr. Epimenio Foot for neurologic care. He recently had an eye exam, and his optical neuritis is also stable. His BM's are much better than a year ago, and he averages a BM every 1-2 days. He also has much better bladder control, and he tries to urinate every 2-3 hours to keep his bladder empty.    Review of Systems  Constitutional: Negative.   HENT: Negative.    Eyes:  Positive for visual disturbance.  Respiratory: Negative.    Cardiovascular: Negative.   Gastrointestinal: Negative.   Genitourinary: Negative.   Musculoskeletal: Negative.   Skin: Negative.   Neurological:  Positive for weakness.  Psychiatric/Behavioral: Negative.         Objective:   Physical Exam Constitutional:      General: He is not in acute distress.    Appearance: He is well-developed. He is not diaphoretic.     Comments: In a wheelchair   HENT:     Head: Normocephalic and atraumatic.     Right Ear: External ear normal.     Left Ear: External ear normal.     Nose: Nose normal.     Mouth/Throat:     Pharynx: No oropharyngeal exudate.  Eyes:     General: No scleral icterus.       Right eye: No discharge.        Left eye: No discharge.     Conjunctiva/sclera: Conjunctivae normal.     Pupils: Pupils are equal, round, and reactive to light.  Neck:     Thyroid: No thyromegaly.     Vascular: No JVD.     Trachea: No tracheal deviation.  Cardiovascular:     Rate and Rhythm: Normal rate and regular rhythm.     Pulses: Normal pulses.     Heart sounds: Normal heart sounds. No murmur heard.    No  friction rub. No gallop.  Pulmonary:     Effort: Pulmonary effort is normal. No respiratory distress.     Breath sounds: Normal breath sounds. No wheezing or rales.  Chest:     Chest wall: No tenderness.  Abdominal:     General: Bowel sounds are normal. There is no distension.     Palpations: Abdomen is soft. There is no mass.     Tenderness: There is no abdominal tenderness. There is no guarding or rebound.  Genitourinary:    Penis: No tenderness.   Musculoskeletal:        General: No tenderness. Normal range of motion.     Cervical back: Neck supple.  Lymphadenopathy:     Cervical: No cervical adenopathy.  Skin:    General: Skin is warm and dry.     Coloration: Skin is not pale.     Findings: No erythema or rash.  Neurological:     Mental Status: He is alert and oriented to person, place, and time. Mental status is at baseline.     Motor: No abnormal muscle tone.     Deep Tendon Reflexes: Reflexes are normal and symmetric.  Psychiatric:        Mood and Affect: Mood normal.        Behavior: Behavior normal.        Thought Content: Thought content normal.        Judgment: Judgment normal.           Assessment & Plan:  Overall he seems to be doing quite well, and he has had no progression of his MS even without medications. His bowel and bladder functions have improved. His vision is stable. We will get fasting labs to check lipids, renal function, vitamin levels, etc. We spent a total of ( 34  ) minutes reviewing records and discussing these issues.  [name], MD

## 2023-10-18 LAB — VITAMIN B1: Vitamin B1 (Thiamine): 25 nmol/L (ref 8–30)

## 2023-10-18 LAB — VITAMIN B3
Nicotinamide: <20 ng/mL
Nicotinic Acid: <20 ng/mL

## 2023-10-22 NOTE — Telephone Encounter (Signed)
 Noted.

## 2023-11-04 ENCOUNTER — Telehealth: Payer: Self-pay

## 2023-11-04 ENCOUNTER — Encounter: Payer: Self-pay | Admitting: Neurology

## 2023-11-04 ENCOUNTER — Other Ambulatory Visit: Payer: Self-pay | Admitting: *Deleted

## 2023-11-04 DIAGNOSIS — G35 Multiple sclerosis: Secondary | ICD-10-CM

## 2023-11-04 DIAGNOSIS — R269 Unspecified abnormalities of gait and mobility: Secondary | ICD-10-CM

## 2023-11-04 DIAGNOSIS — R531 Weakness: Secondary | ICD-10-CM

## 2023-11-04 DIAGNOSIS — M549 Dorsalgia, unspecified: Secondary | ICD-10-CM

## 2023-11-04 DIAGNOSIS — R252 Cramp and spasm: Secondary | ICD-10-CM

## 2023-11-04 DIAGNOSIS — R261 Paralytic gait: Secondary | ICD-10-CM

## 2023-11-04 NOTE — Telephone Encounter (Signed)
 Referrals for PT and OT were sent to :  [facility])  [address] [place] [address], [place] [address] Phone: [phone] Fax: [phone]

## 2023-11-19 DIAGNOSIS — K080 Exfoliation of teeth due to systemic causes: Secondary | ICD-10-CM | POA: Diagnosis not present

## 2023-12-08 DIAGNOSIS — R261 Paralytic gait: Secondary | ICD-10-CM | POA: Diagnosis not present

## 2023-12-08 DIAGNOSIS — R278 Other lack of coordination: Secondary | ICD-10-CM | POA: Diagnosis not present

## 2023-12-08 DIAGNOSIS — R269 Unspecified abnormalities of gait and mobility: Secondary | ICD-10-CM | POA: Diagnosis not present

## 2023-12-08 DIAGNOSIS — R252 Cramp and spasm: Secondary | ICD-10-CM | POA: Diagnosis not present

## 2023-12-08 DIAGNOSIS — R2681 Unsteadiness on feet: Secondary | ICD-10-CM | POA: Diagnosis not present

## 2023-12-08 DIAGNOSIS — M6281 Muscle weakness (generalized): Secondary | ICD-10-CM | POA: Diagnosis not present

## 2023-12-16 DIAGNOSIS — M6281 Muscle weakness (generalized): Secondary | ICD-10-CM | POA: Diagnosis not present

## 2023-12-16 DIAGNOSIS — R269 Unspecified abnormalities of gait and mobility: Secondary | ICD-10-CM | POA: Diagnosis not present

## 2023-12-16 DIAGNOSIS — R278 Other lack of coordination: Secondary | ICD-10-CM | POA: Diagnosis not present

## 2023-12-16 DIAGNOSIS — R2681 Unsteadiness on feet: Secondary | ICD-10-CM | POA: Diagnosis not present

## 2023-12-16 DIAGNOSIS — R261 Paralytic gait: Secondary | ICD-10-CM | POA: Diagnosis not present

## 2023-12-16 DIAGNOSIS — R252 Cramp and spasm: Secondary | ICD-10-CM | POA: Diagnosis not present

## 2024-02-11 ENCOUNTER — Encounter: Payer: Self-pay | Admitting: Neurology

## 2024-02-11 ENCOUNTER — Ambulatory Visit: Payer: Medicare Other | Admitting: Neurology

## 2024-02-11 VITALS — BP 130/83 | HR 83 | Ht 68.0 in

## 2024-02-11 DIAGNOSIS — R252 Cramp and spasm: Secondary | ICD-10-CM

## 2024-02-11 DIAGNOSIS — Z79899 Other long term (current) drug therapy: Secondary | ICD-10-CM

## 2024-02-11 DIAGNOSIS — R39198 Other difficulties with micturition: Secondary | ICD-10-CM

## 2024-02-11 DIAGNOSIS — G35 Multiple sclerosis: Secondary | ICD-10-CM | POA: Diagnosis not present

## 2024-02-11 DIAGNOSIS — H5121 Internuclear ophthalmoplegia, right eye: Secondary | ICD-10-CM

## 2024-02-11 DIAGNOSIS — R269 Unspecified abnormalities of gait and mobility: Secondary | ICD-10-CM | POA: Diagnosis not present

## 2024-02-11 MED ORDER — TIZANIDINE HCL 4 MG PO TABS
4.0000 mg | ORAL_TABLET | Freq: Four times a day (QID) | ORAL | 5 refills | Status: DC | PRN
Start: 2024-02-11 — End: 2024-03-17

## 2024-02-11 NOTE — Progress Notes (Signed)
 [facility]  PATIENT: Alexander [name] DOB: Aug 20, 1975  REFERRING DOCTOR OR PCP:  Dr. Maude Canavan (PCP)  _________________________________   HISTORICAL  CHIEF COMPLAINT:  Chief Complaint  Patient presents with   Follow-up    Pt in room 10.alone. Here for MS follow up. Pt states right side weakness is stable. Pt reports 2-3 falls this year, last eye exam was this year.     HISTORY OF PRESENT ILLNESS:  Alexander [name] is a 48 y.o. man with a relapsing/active form of secondary progressive multiple sclerosis diagnosed in 2003.     Update 02/11/2024 He stopped Vumerity  and does not want to go back on a DMT.   He has no exacerbations.  He continues to experience spasticity, especially on the left side, arm and leg.  He felt poorly on Vumerity  with muscle spasms, GI symptoms and suicidal ideation.. All side effects have subsided since stopping. .   I have strongly urged him to get on a different DMT.    MRI  09/09/2023 showed T2/FLAIR hyperintense foci in the cerebral hemispheres, cerebellum and brainstem in a pattern consistent with chronic demyelinating plaque associated with multiple sclerosis. No other foci enhanced or appear to be acute. Compared to the MRI from 08/22/2022, there were no new lesions.   Gait and balance are poor.  He uses a Rolator both inside and outside for short distance but a chair for longer distance.  He has a Chief of Staff wheelchair.  With a Rolator, he can go  > 100 feet.   Has some falls abput 1/month.   He does > 1000 steps a day.    He does worse in the heat.  Left arm is worse still but right hand seems to be catching up with more weakness.   Left > right legs have spasticity, worse at night. He takes baclofen  10 mg prn and CBD daily which helps a little.  Some left tremors.  Higher dose of baclofen  caused weakness.  He has poor coordination, left > right    He has bladder urgencys.  Vision is doing the same with sequela of right optic  neuritis - blurry and reduced colors.    Alexander [name]     He feels fatigue is mostly stable.  He does worse in heat.  He takes naps most days.  He sleeps 6-8 hours at night.  Sometimes he is not refreshed in the morning.  He feels depression better than last year.  He is sometimes irritable.   Cognition is doing well.   He was hospitalized in January 2023 for Covid and felt a lot weaker.   After fluids and a couple days he was much better.   He did PT with benefit.   MRi report mentioned one additional midbrain lesion but I see no new lesions.   Multiple cervical spine lesions look stable.    MS history: In 2003 he had optic neuritis started on the left and then also on the right. He had an MRI of the brain performed at that time and was told that it was normal. He had follow-up MRIs every year or 2 for several more years but they continue to be normal. In 2012, he noticed worsening issues with gait. He had had some difficulty with the gait for the previous couple of years but due to orthopedic issues a neurologic cause was not looked into. An MRI of the cervical spine showed multiple plaques consistent with MS. Initially, he was placed on Copaxone.  Although he tolerated Copaxone well he had some breakthrough disease with a new spot developing in the brain. Therefore he was switched to Tysabri. He tolerated Tysabri well and his MS was well controlled. However, he was JCV antibody positive a decision was made to stop Tysabri after 2 years.   He had some exacerbations a few months after stopping.   Years ago he went on Gilenya. He has tolerated it well and he did better on Tysabri. He was seeing Dr. Gwendlyn Lex at [facility] on [address]. He had Ocrevus  August 2017.  He switched from Ocrevus  to Vumerity  January 2021 due to continued progression and fatigue while on Ocrevus .    Off all med's mid-2024.    MRI images MRI of the brain 09/21/2019 showed multiple T2/FLAIR hyperintense foci, mostly in the  infratentorial white matter in a pattern and configuration consistent with chronic demyelinating plaque associated with multiple sclerosis.  None of the foci enhances or appears to be acute.  Compared to the MRI dated 02/18/2018, there are no new lesions.  MRI of the cervical spine 09/21/2019 showed patchy hyperintense signal within the spinal cord from C2-C6.  Another small focus is noted at C7-T1 to the right.  Compared to the MRI from 02/18/2018, there are no new lesions.  Additionally, there are multilevel degenerative changes, mostly mild.  No definite nerve root compression or spinal stenosis.  MRI of the cervical spine 08/22/2022 showed T2 hyperintense signal from C2 to C6-C7 and again at C7 - T1. None of the foci enhance. Compared to the MRI from 08/09/2021, there did not appear to be any new lesions there are degenerative changes in the cervical spine with mild spinal stenosis at C5-C6 and various degrees of foraminal narrowing.  No nerve root compression.  MRI of the brain 08/22/2022 showed Scattered T2/FLAIR hyperintense foci in the brainstem, cerebellum and cerebral hemispheres in a pattern consistent with chronic demyelinating plaque associated with multiple sclerosis. None of the foci appear to be acute. They do not enhance. Compared to the MRI from 08/09/2021, there are no new lesions.    MRI  09/09/2023 showed T2/FLAIR hyperintense foci in the cerebral hemispheres, cerebellum and brainstem in a pattern consistent with chronic demyelinating plaque associated with multiple sclerosis. No other foci enhanced or appear to be acute. Compared to the MRI from 08/22/2022, there were no new lesions.      REVIEW OF SYSTEMS: Constitutional: No fevers, chills, sweats, or change in appetite.   Reports minimal fatigue. Eyes: No visual changes, double vision, eye pain Ear, nose and throat: No hearing loss, ear pain, nasal congestion, sore throat Cardiovascular: No chest pain, palpitations Respiratory:  No  shortness of breath at rest or with exertion.   No wheezes GastrointestinaI: No nausea, vomiting, diarrhea, abdominal pain, fecal incontinence Genitourinary:  reports frequency, hesitancy and nocturia. Musculoskeletal:  No neck pain, back pain Integumentary: No rash, pruritus, skin lesions Neurological: as above Psychiatric: No depression at this time.  No anxiety Endocrine: No palpitations, diaphoresis, change in appetite, change in weigh or increased thirst Hematologic/Lymphatic:  No anemia, purpura, petechiae. Allergic/Immunologic: No itchy/runny eyes, nasal congestion, recent allergic reactions, rashes  ALLERGIES: No Known Allergies  HOME MEDICATIONS:  Current Outpatient Medications:    Ascorbic Acid  (VITA-C PO), Take 1,000 mg by mouth daily., Disp: , Rfl:    baclofen  (LIORESAL ) 10 MG tablet, Take 10mg  three times daily as needed, Disp: 270 tablet, Rfl: 3   Cholecalciferol  (DIALYVITE VITAMIN D  5000) 125 MCG (5000 UT)  capsule, Take 5,000 Units by mouth daily., Disp: , Rfl:    Cyanocobalamin  (B-12 PO), Take 1 Dose by mouth every other day., Disp: , Rfl:    MAGNESIUM PO, Take 500 mg by mouth daily., Disp: , Rfl:    Misc Natural Products (NF FORMULAS TESTOSTERONE ) CAPS, Take 165 mg by mouth daily., Disp: , Rfl:    Nutritional Supplements (JUICE PLUS FIBRE PO), Take 1 capsule by mouth daily., Disp: , Rfl:    Omega-3 Fatty Acids (FISH OIL) 1000 MG CAPS, Take 1,200 mg by mouth daily., Disp: , Rfl:    Probiotic Product (PROBIOTIC PO), Take 3 capsules by mouth daily. Nature Made, Disp: , Rfl:    tiZANidine  (ZANAFLEX ) 4 MG tablet, Take 1 tablet (4 mg total) by mouth every 6 (six) hours as needed for muscle spasms., Disp: 90 tablet, Rfl: 5   Turmeric 500 MG TABS, Take 1 Dose by mouth every other day., Disp: , Rfl:    UNABLE TO FIND, Take 500-1,000 mg by mouth daily. CBD oil, Disp: , Rfl:    UNABLE TO FIND, Take 21 g by mouth. Med Name: Vegan protein powder, Disp: , Rfl:    UNABLE TO FIND, Med  Name: [employer] powder, Disp: , Rfl:   PAST MEDICAL HISTORY: Past Medical History:  Diagnosis Date   Hypertension    Multiple sclerosis (HCC) sees Dr. Charlie Crete   Vision abnormalities     PAST SURGICAL HISTORY: Past Surgical History:  Procedure Laterality Date   ACHILLES TENDON SURGERY     ANTERIOR CRUCIATE LIGAMENT REPAIR     MENISCUS REPAIR      FAMILY HISTORY: Family History  Problem Relation Age of Onset   Congestive Heart Failure Father    Stroke Maternal Grandfather    Multiple sclerosis Sister     SOCIAL HISTORY:  Social History   Socioeconomic History   Marital status: Divorced    Spouse name: Not on file   Number of children: Not on file   Years of education: Not on file   Highest education level: Bachelor's degree (e.g., BA, AB, BS)  Occupational History   Not on file  Tobacco Use   Smoking status: Never   Smokeless tobacco: Never  Vaping Use   Vaping status: Never Used  Substance and Sexual Activity   Alcohol use: No   Drug use: No   Sexual activity: Not on file  Other Topics Concern   Not on file  Social History Narrative   Not on file   Social Drivers of Health   Financial Resource Strain: Medium Risk (08/17/2023)   Overall Financial Resource Strain (CARDIA)    Difficulty of Paying Living Expenses: Somewhat hard  Food Insecurity: Patient Declined (08/17/2023)   Hunger Vital Sign    Worried About Running Out of Food in the Last Year: Patient declined    Ran Out of Food in the Last Year: Patient declined  Transportation Needs: No Transportation Needs (08/17/2023)   PRAPARE - Administrator, Civil Service (Medical): No    Lack of Transportation (Non-Medical): No  Physical Activity: Sufficiently Active (08/17/2023)   Exercise Vital Sign    Days of Exercise per Week: 5 days    Minutes of Exercise per Session: 30 min  Stress: No Stress Concern Present (08/17/2023)   [employer] of Occupational Health - Occupational  Stress Questionnaire    Feeling of Stress : Only a little  Social Connections: Unknown (08/17/2023)   Social Connection and  Isolation Panel    Frequency of Communication with Friends and Family: Twice a week    Frequency of Social Gatherings with Friends and Family: Once a week    Attends Religious Services: Patient declined    Database administrator or Organizations: No    Attends Engineer, structural: Not on file    Marital Status: Divorced  Intimate Partner Violence: Not At Risk (06/11/2022)   Humiliation, Afraid, Rape, and Kick questionnaire    Fear of Current or Ex-Partner: No    Emotionally Abused: No    Physically Abused: No    Sexually Abused: No     PHYSICAL EXAM  Vitals:   02/11/24 1128 02/11/24 1134  BP: (!) 173/93 130/83  Pulse: 83   Height: 5' 8 (1.727 m)      Body mass index is 24.08 kg/m.   General: The patient is well-developed and well-nourished and in no acute distress.      Neurologic Exam  Mental status: The patient is alert and oriented x 3 at the time of the examination. The patient has apparent normal recent and remote memory, with an apparently normal attention span and concentration ability.  Speech is fairly normal.  Cranial nerves: Extraocular movements exam shows nystagmus on right gaze c/w INO.Alexander [name]  He has reduced color vision and acuity out of the right eye.. Facial strength and sensation and trapezius strength were normal.  Hearing appeared to be normal and symmetric.  Motor:  Muscle bulk is normal.  Muscle tone is increased in the legs, left greater than right and in the left arm.   Right arm has normal tone.   He has 4+/5 strength in the proximal left leg and 4-/5 distal.  Has 4/5 prox right and distal right strength.  Reduced RAM in left ar and both legs (worse on left)  Sensory: He has slightly reduced sensation to vibration in the left leg vs right.  Touch sensation was more symmetric.  Coordination: Finger-nose-finger is performed  very poorly on the left with mild tremor, mildly reduced on right.  Heel-to-shin is performed poorly on the right and he cannot do on the left.  Gait and station: Station requires support.  He has difficulty taking steps without a walker.  Romberg is positive.    Reflexes: Deep tendon reflexes are symmetric and increased in legs,with spread at both knees (worse on left) and nonsustained clonus at left ankle.          ASSESSMENT AND PLAN    1. Multiple sclerosis (HCC)   2. Spasticity   3. Gait disturbance   4. High risk medication use   5. Internuclear ophthalmoplegia of right eye   6. Urinary dysfunction      1.   He swishes to remain off all DMTs.   We had discussed [employer] and he was slightly interested.  I went over the risks and benefits.. Will check MRI annually.  2.   Continue to be active and exercise as tolerated.      Continue Vit D (2000 U a day ok level was 88 in early 2024) 3.   Switch baclofen  to tizanidine  4 mg tid 4.  He will return to see us   in 6 months for a regular visit or call sooner if there are new or worsening neurologic symptoms.   This visit is part of a comprehensive longitudinal care medical relationship regarding the patients primary diagnosis of MS and related concerns.      Astin Sayre  RONAL Crete, MD, PhD, FAAN Certified in Neurology, Clinical Neurophysiology, Sleep Medicine, Pain Medicine and Neuroimaging Director, [facility]  [facility] [address] [place], [place] [address] [phone]

## 2024-03-09 ENCOUNTER — Ambulatory Visit: Attending: Neurology

## 2024-03-09 ENCOUNTER — Ambulatory Visit: Admitting: Occupational Therapy

## 2024-03-09 ENCOUNTER — Other Ambulatory Visit: Payer: Self-pay

## 2024-03-09 DIAGNOSIS — R29818 Other symptoms and signs involving the nervous system: Secondary | ICD-10-CM

## 2024-03-09 DIAGNOSIS — M549 Dorsalgia, unspecified: Secondary | ICD-10-CM | POA: Insufficient documentation

## 2024-03-09 DIAGNOSIS — G35 Multiple sclerosis: Secondary | ICD-10-CM | POA: Insufficient documentation

## 2024-03-09 DIAGNOSIS — R531 Weakness: Secondary | ICD-10-CM | POA: Insufficient documentation

## 2024-03-09 DIAGNOSIS — R278 Other lack of coordination: Secondary | ICD-10-CM | POA: Diagnosis not present

## 2024-03-09 DIAGNOSIS — R2681 Unsteadiness on feet: Secondary | ICD-10-CM | POA: Insufficient documentation

## 2024-03-09 DIAGNOSIS — R269 Unspecified abnormalities of gait and mobility: Secondary | ICD-10-CM | POA: Diagnosis not present

## 2024-03-09 DIAGNOSIS — R262 Difficulty in walking, not elsewhere classified: Secondary | ICD-10-CM | POA: Diagnosis not present

## 2024-03-09 DIAGNOSIS — R208 Other disturbances of skin sensation: Secondary | ICD-10-CM | POA: Diagnosis not present

## 2024-03-09 DIAGNOSIS — R2689 Other abnormalities of gait and mobility: Secondary | ICD-10-CM | POA: Insufficient documentation

## 2024-03-09 DIAGNOSIS — M6281 Muscle weakness (generalized): Secondary | ICD-10-CM

## 2024-03-09 DIAGNOSIS — R252 Cramp and spasm: Secondary | ICD-10-CM | POA: Diagnosis not present

## 2024-03-09 DIAGNOSIS — R261 Paralytic gait: Secondary | ICD-10-CM | POA: Insufficient documentation

## 2024-03-09 NOTE — Therapy (Signed)
 OUTPATIENT OCCUPATIONAL THERAPY NEURO EVALUATION  Patient Name: Alexander [name] MRN: [id number] DOB:1975-11-15, 48 y.o., male Today's Date: 03/09/2024  PCP: [name] LABOR, MD REFERRING PROVIDER: Vear Charlie LABOR, MD  END OF SESSION:  OT End of Session - 03/09/24 1319     Visit Number 1    Number of Visits 9    Date for OT Re-Evaluation 05/07/24    Authorization Type BCBS Medicare 2025    OT Start Time 1234    OT Stop Time 1318    OT Time Calculation (min) 44 min          Past Medical History:  Diagnosis Date   Hypertension    Multiple sclerosis (HCC) sees Dr. 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   Mid back pain 08/13/2018   Transverse myelitis (HCC) 02/19/2018   History of optic neuritis 02/10/2018   Hand weakness 07/10/2016   Spastic gait 03/07/2016   Multiple sclerosis (HCC) 03/07/2016   Urinary dysfunction 03/07/2016   Tremor 03/07/2016    ONSET DATE: referral date 11/04/23  REFERRING DIAG: G35 (ICD-10-CM) - Multiple sclerosis (HCC) R25.2 (ICD-10-CM) - Spasticity R26.9 (ICD-10-CM) - Gait disturbance M54.9 (ICD-10-CM) - Mid back pain R26.1 (ICD-10-CM) - Spastic gait R53.1 (ICD-10-CM) - Weakness  THERAPY DIAG:  Muscle weakness (generalized)  Other lack of coordination  Other disturbances of skin sensation  Other symptoms and signs involving the nervous system  Rationale for Evaluation and Treatment: Rehabilitation  SUBJECTIVE:   SUBJECTIVE STATEMENT: Pt reports [facility] was last week due to their facility closing.   Pt reports working 1x/week with PT and OT.  Pt reports that he was working on posture and his hands as they have gone to garbage.   Pt reports R side weakness, L side tightness, and L side core weakness.  Pt reports that he tends to lean to L in sitting and to R in standing.  I have to think about everything before I do it. Pt accompanied by: family member (mother dropped him off)  PERTINENT HISTORY: 48 y.o. man with a relapsing/active form of secondary progressive multiple sclerosis diagnosed in 2003.   Gait and balance are poor.  He uses a Rolator both inside and outside for short distance but a chair for longer distance.  He has a Chief of Staff wheelchair.  With a Rolator, he can go  > 100 feet.   Has some falls abput 1/month.   He does > 1000 steps a day.    He does worse in the heat.  Left arm is worse still but right hand seems to be catching up with more weakness.   Left > right legs have spasticity, worse at night. He takes baclofen  10 mg prn and CBD daily which helps a little.  Some left tremors.  Higher dose of baclofen  caused weakness.  He has poor coordination, left > right. Reports secondary progressive type MS   PRECAUTIONS: Fall  WEIGHT BEARING RESTRICTIONS: No  PAIN:  Are you having pain? No  FALLS: Has patient fallen in last 6 months? Yes. Number of falls 3, requiring fire department to assist  LIVING ENVIRONMENT: Lives with: lives with  their family Lives in: House/apartment Stairs: ramp entrance Has following equipment at home: Vannie - 2 wheeled, Environmental consultant - 4 wheeled, Wheelchair (power), shower chair, and Grab bars  PLOF: Independent with basic ADLs, Independent with household mobility with device, and Requires assistive device for independence  PATIENT GOALS: improved posture, functional use of arms and hands  OBJECTIVE:  Note: Objective measures were completed at Evaluation unless otherwise noted.  HAND DOMINANCE: Right  ADLs: Overall ADLs: I have adapted Transfers/ambulation related to ADLs: uses Rollator for short distances in the home and hand walks into bathroom holding on to  counters and railing in bathroom Eating: difficulty with opening containers, scooping foods Grooming: spasticity in arms impacts ease with brushing teeth UB Dressing: Mod I, every once in awhile has some difficulty getting shirt on LB Dressing: Mod I Toileting: uses railing in bathroom and hand walking to get in and around bathroom Bathing: Mod I Tub Shower transfers: Mod I Equipment: Shower seat with back, Grab bars, and Walk in shower  IADLs: Shopping: mother performs Light housekeeping: mother performs Meal Prep: mother performs Community mobility: mother provides transportation Medication management: Mod I Financial management: Mod I Handwriting: reports he rarely writes anymore  MOBILITY STATUS: Needs Assist: Mod I stand pivot transfers with Rollator, Supervision short distance ambulation with Rollator  POSTURE COMMENTS:  rounded shoulders, forward head, increased thoracic kyphosis, and weight shift left  ACTIVITY TOLERANCE: Activity tolerance: requires a couple days to recover from therapy appts  FUNCTIONAL OUTCOME MEASURES: PSFS   UPPER EXTREMITY ROM:    Active ROM Right eval Left eval  Shoulder flexion 114 130  Shoulder abduction 90 100  Shoulder adduction    Shoulder extension    Shoulder internal rotation    Shoulder external rotation    Elbow flexion [facility] WFL  Elbow extension [facility] WFL  Wrist flexion [facility] WFL  Wrist extension [facility] WFL  Wrist ulnar deviation    Wrist radial deviation    Wrist pronation    Wrist supination    (Blank rows = not tested)  UPPER EXTREMITY MMT:   increased spasticity noted in L with attempts at MMT  MMT Right eval Left eval  Shoulder flexion 3 4-  Shoulder abduction 3+ 4  Shoulder adduction    Shoulder extension    Shoulder internal rotation    Shoulder external rotation    Middle trapezius    Lower trapezius    Elbow flexion 4 4  Elbow extension 4 4  Wrist flexion 3+ 4-  Wrist extension 3+ 4-  Wrist ulnar  deviation    Wrist radial deviation    Wrist pronation    Wrist supination    (Blank rows = not tested)  HAND FUNCTION: Grip strength: Right: 40 (40, 36, and 46) lbs; Left: 65 lbs  COORDINATION: 9 Hole Peg test: Right: 2:03 (required 1:35 to place 9 pegs); Left: 2:35 (1:57 to place pegs) Box and Blocks:  Right 25 blocks, Left 18 blocks  SENSATION: Decreased sensation bilaterally, impacting ease with picking up items. I couldn't feel what I was touching.  MUSCLE TONE: increased spasticity LUE > RUE  COGNITION: Overall cognitive status: Within functional limits for tasks assessed  VISION: Subjective report: optic neuritis in the R eye, I have lost vision in the R eye Baseline vision: Wears glasses all the time  TREATMENT DATE:  03/09/24 Educated on role and purpose of OT as well as potential interventions and goals for therapy based on initial evaluation findings.   Educated on functional grasp and norms for grip strength and coordination assessments.    PATIENT EDUCATION: Education details: Educated on role and purpose of OT as well as potential interventions and goals for therapy based on initial evaluation findings. Person educated: Patient Education method: Explanation Education comprehension: verbalized understanding and needs further education  HOME EXERCISE PROGRAM: TBD   GOALS: Goals reviewed with patient? Yes  SHORT TERM GOALS: Target date: 04/09/24  Pt will be independent with gross and fine motor control HEP. Baseline: new to OPOT Goal status: INITIAL  2.  Pt will demonstrate improved UE functional use for ADLs as evidenced by increasing box/ blocks score by 4 blocks with BUE Baseline: Right 25 blocks, Left 18 blocks Goal status: INITIAL  3.  Pt will verbalize understanding of energy conservation techniques.  Baseline: new to  OPOT Goal status: INITIAL  4.  Pt will verbalize understanding of task modifications and/or potential DME / AE needs to increase ease, safety, and independence w/ ADLs. Baseline:  Goal status: INITIAL  5.  Pt will verbalize understanding of safety concerns and precautions due to impaired sensation in BUE. Baseline: impaired sensation Goal status: INITIAL    LONG TERM GOALS: Target date: 05/07/24  Pt will demonstrate improved UE functional use for ADLs as evidenced by increasing box/ blocks score by 6 blocks with BUE Baseline: Right 25 blocks, Left 18 blocks Goal status: INITIAL  2.  Pt will demonstrate improved trunk/postural control with ability to shift in and out of midline and recognize when losing balance during seated or standing ADLs. Baseline: Left lean in unsupported position Goal status: INITIAL  3.   Pt will demonstrate improved grip strength by 5# on R to increase ease with opening grooming and food containers. Baseline: R: 40# Goal status: INITIAL  4.  Pt will perform dynamic standing task for 5 mins for simple IADLS w/o LOB using DME and/or countertop support prn Baseline: Right lean in standing Goal status: INITIAL  5.  Pt will demonstrate improved shoulder flexion on RUE by 10# to allow for increased reach and strength to remove light-moderate weight items as needed for ADLs and IADLs.  Baseline: RUE 3- to 4 Goal status: INITIAL  6.  Patient will report at least two-point increase in average PSFS score or at least three-point increase in a single activity score indicating functionally significant improvement given minimum detectable change. Baseline: 4.3 Goal status: INITIAL  ASSESSMENT:  CLINICAL IMPRESSION: Patient is a 48 y.o. male who was seen today for occupational therapy evaluation for weakness and spasticity due to multiple sclerosis. Pt demonstrates greater weakness in dominant RUE, however increased spasticity on L impacting coordination with 9 hole  peg test and box and blocks assessments, as well as impaired posture and balance.  Pt is Mod I with majority of ADLs including bathroom transfers.  Pt does report intermittent difficulty with picking foot up over shower ledge during transfers.  Pt will benefit from skilled occupational therapy services to address strength and coordination, ROM, pain management, altered sensation, balance, GM/FM control, safety awareness, introduction of compensatory strategies/AE prn, and implementation of an HEP to improve participation and safety during ADLs and IADLs.    PERFORMANCE DEFICITS: in functional skills including ADLs, IADLs, coordination, sensation, ROM, strength, flexibility, Fine motor control, Gross motor control, balance, body mechanics, endurance, decreased knowledge of  precautions, decreased knowledge of use of DME, and UE functional use and psychosocial skills including coping strategies, environmental adaptation, and routines and behaviors.   IMPAIRMENTS: are limiting patient from ADLs and IADLs.   CO-MORBIDITIES: may have co-morbidities  that affects occupational performance. Patient will benefit from skilled OT to address above impairments and improve overall function.  MODIFICATION OR ASSISTANCE TO COMPLETE EVALUATION: Min-Moderate modification of tasks or assist with assess necessary to complete an evaluation.  OT OCCUPATIONAL PROFILE AND HISTORY: Detailed assessment: Review of records and additional review of physical, cognitive, psychosocial history related to current functional performance.  CLINICAL DECISION MAKING: Moderate - several treatment options, min-mod task modification necessary  REHAB POTENTIAL: Good  EVALUATION COMPLEXITY: Moderate    PLAN:  OT FREQUENCY: 1x/week  OT DURATION: 8 weeks  PLANNED INTERVENTIONS: 97168 OT Re-evaluation, 97535 self care/ADL training, 02889 therapeutic exercise, 97530 therapeutic activity, 97112 neuromuscular re-education, 97140 manual  therapy, 97113 aquatic therapy, 97035 ultrasound, balance training, functional mobility training, psychosocial skills training, energy conservation, coping strategies training, patient/family education, and DME and/or AE instructions  RECOMMENDED OTHER SERVICES: NA  CONSULTED AND AGREED WITH PLAN OF CARE: Patient  PLAN FOR NEXT SESSION: initiate coordination and strengthening HEP, may benefit from putty for grip strength, engage in unsupported sitting progressing to standing incorporating weight shifting and reaching.   KAYLENE DOMINO, OTR/L 03/09/2024, 1:51 PM  [facility] at [facility] [address] [place], [address] [place], [place] [address] Phone # [phone] Fax # [phone]

## 2024-03-09 NOTE — Therapy (Signed)
 OUTPATIENT PHYSICAL THERAPY NEURO EVALUATION   Patient Name: Alexander [name] MRN: [id number] DOB:06-17-1976, 48 y.o., male Today's Date: 03/09/2024   PCP: [name] LABOR, MD REFERRING PROVIDER: Vear Charlie LABOR, MD  END OF SESSION:  PT End of Session - 03/09/24 1146     Visit Number 1    Number of Visits 9    Date for PT Re-Evaluation 05/04/24    Authorization Type BCBS Medicare    PT Start Time 1145    PT Stop Time 1230    PT Time Calculation (min) 45 min          Past Medical History:  Diagnosis Date   Hypertension    Multiple sclerosis (HCC) sees Dr. 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   Mid back pain 08/13/2018   Transverse myelitis (HCC) 02/19/2018   History of optic neuritis 02/10/2018   Hand weakness 07/10/2016   Spastic gait 03/07/2016   Multiple sclerosis (HCC) 03/07/2016   Urinary dysfunction 03/07/2016   Tremor 03/07/2016    ONSET DATE: 2005, 2012  REFERRING DIAG: G35 (ICD-10-CM) - Multiple sclerosis (HCC) R25.2 (ICD-10-CM) - Spasticity R26.9 (ICD-10-CM) - Gait disturbance M54.9 (ICD-10-CM) - Mid back pain R26.1 (ICD-10-CM) - Spastic gait R53.1 (ICD-10-CM) - Weakness  THERAPY DIAG:  Muscle weakness (generalized)  Other abnormalities of gait and mobility  Unsteadiness on feet  Difficulty in walking, not elsewhere classified  Rationale for Evaluation and Treatment: Rehabilitation  SUBJECTIVE:                                                                                                                                                                                             SUBJECTIVE STATEMENT:  Been stable, no recent flairs.  Weaker on the right, more  tight on the left, feels like it zig-zags up the body. Had been [facility] to [facility] until [facility] closing.  Walking some household distances w/ rollator and uses power chair for community distances. Noting ongoing mobility issues and balance problems limiting safety. Increased weakness and balance problems with issues of falling backwards. Pt accompanied by: self  PERTINENT HISTORY: 47 y.o. man with a relapsing/active form of secondary progressive multiple sclerosis diagnosed in 2003.   Gait and balance are poor.  He uses a Museum/gallery exhibitions officer both inside and outside  for short distance but a chair for longer distance.  He has a Chief of Staff wheelchair.  With a Rolator, he can go  > 100 feet.   Has some falls abput 1/month.   He does > 1000 steps a day.    He does worse in the heat.  Left arm is worse still but right hand seems to be catching up with more weakness.   Left > right legs have spasticity, worse at night. He takes baclofen  10 mg prn and CBD daily which helps a little.  Some left tremors.  Higher dose of baclofen  caused weakness.  He has poor coordination, left > right. Reports secondary progressive type MS  PAIN:  Are you having pain? No  PRECAUTIONS: Fall  RED FLAGS: None   WEIGHT BEARING RESTRICTIONS: No  FALLS: Has patient fallen in last 6 months? Yes. Number of falls 3, requiring fire department to assist  LIVING ENVIRONMENT: Lives with: lives with their family Lives in: House/apartment Stairs: ramp entrance Has following equipment at home: Vannie - 2 wheeled, Environmental consultant - 4 wheeled, Wheelchair (power), shower chair, Tour manager, and Grab bars  PLOF: Independent with basic ADLs and Independent with household mobility with device,   PATIENT GOALS: improve posture, strength, stability  OBJECTIVE:  Note: Objective measures were completed at Evaluation unless otherwise noted.  DIAGNOSTIC FINDINGS: MRI 09/09/2023 showed T2/FLAIR hyperintense foci in the cerebral hemispheres,  cerebellum and brainstem in a pattern consistent with chronic demyelinating plaque associated with multiple sclerosis. No other foci enhanced or appear to be acute. Compared to the MRI from 08/22/2022, there were no new lesions.  COGNITION: Overall cognitive status: Within functional limits for tasks assessed   SENSATION: Denies any parasthesias  COORDINATION: Grossly limited   EDEMA:  none  MUSCLE TONE: spasticity BLE, sustained clonus on R, 3-4 bear L. Bilat quads, hamstring flaccid  MUSCLE LENGTH: Hamstrings: Right -10 deg; Left -10 deg   DTRs:  Patella 3+ = Brisk  POSTURE: rounded shoulders, forward head, and increased thoracic kyphosis  LOWER EXTREMITY ROM:     Active  Right Eval Left Eval  Hip flexion 4 0  Hip extension    Hip abduction    Hip adduction    Hip internal rotation    Hip external rotation    Knee flexion 110 110  Knee extension 0 0  Ankle dorsiflexion 0 5  Ankle plantarflexion    Ankle inversion    Ankle eversion     (Blank rows = not tested)  LOWER EXTREMITY MMT:    MMT Right Eval Left Eval  Hip flexion 2- 2-  Hip extension    Hip abduction 2 2  Hip adduction 2 2  Hip internal rotation    Hip external rotation    Knee flexion 2+ 2  Knee extension 4- 3+  Ankle dorsiflexion 0 2  Ankle plantarflexion    Ankle inversion    Ankle eversion    (Blank rows = not tested)  BED MOBILITY:  Reports he uses a rope on the foot board to long sit->sit EOB  TRANSFERS: Sit to stand: Modified independence  Assistive device utilized: Environmental consultant - 4 wheeled     Stand to sit: Modified independence  Assistive device utilized: Environmental consultant - 4 wheeled     Chair to chair: SBA  Assistive device utilized: Environmental consultant - 4 wheeled       RAMP:  Not tested  CURB:  Not tested  STAIRS: Not tested GAIT: Findings: Gait Characteristics: Right foot  flat, wide BOS, and poor foot clearance- Left, Distance walked: 25 ft, Level of assistance: SBA and CGA, and Comments:     FUNCTIONAL TESTS:  5 times sit to stand: 31 sec UE use 2 of 5 Timed up and go (TUG): 1 min 49 sec w/ 5TT Romberg: 23 sec eyes open w/ retro-LOB Berg Balance Test: TBD Function In Sitting Test: 45/56                                                                                                                                TREATMENT DATE:     PATIENT EDUCATION: Education details: assessment details Person educated: Patient Education method: Explanation Education comprehension: verbalized understanding  HOME EXERCISE PROGRAM: TBD  GOALS: Goals reviewed with patient? Yes  SHORT TERM GOALS: Target date: 04/06/2024    Patient will be independent in HEP to improve functional outcomes Baseline: Goal status: INITIAL  2.  Improve unsupported standing x 60 sec to improve ADL participation Baseline: 23 sec w/ retro-LOB Goal status: INITIAL  3.  Teach-back strategies/techniques/activities to address forward head/rounded shoulder posture to improve upright posture for enhanced balance Baseline:  Goal status: INITIAL    LONG TERM GOALS: Target date: 05/04/2024    Demo improved BLE strength and reduced risk for falls per time 20 sec 5xSTS test Baseline: 31 sec w/ UE support 2 of 5 reps Goal status: INITIAL  2.  Demo improved mobility and reduced risk for falls per time 75 sec TUG test Baseline: 1 min 49 sec w/ 5TT Goal status: INITIAL  3.  Berg Balance Test TBD Baseline:  Goal status: INITIAL  4.  Ambulation level surfaces x 150 ft w/ modified independence to improve safety and efficiency with household ambulation Baseline: up to 100 ft w/ SBA and 4WW Goal status: INITIAL  5.  Perform car transfers w/ supervision to reduce level of assistance from caregivers  Baseline: min-mod A  Goal status: INITIAL  ASSESSMENT:  CLINICAL IMPRESSION: Patient is a 48 y.o. male who was seen today for physical therapy evaluation and treatment for G35 (ICD-10-CM) - Multiple  sclerosis (HCC) R25.2 (ICD-10-CM) - Spasticity R26.9 (ICD-10-CM) - Gait disturbance M54.9 (ICD-10-CM) - Mid back pain R26.1 (ICD-10-CM) - Spastic gait R53.1 (ICD-10-CM) - Weakness.  Exhibits significant BLE weakness and spasticity with limitations/deficits affecting posture, balance, and functional mobility w/ high risk for falls evident by outcome measures.  Decline in functional ambulation with recent history of falling and requiring EMS services to assist for recovery.  Significant gait deviations and balance deficits limiting safe household mobility requiring increased need for assistance from caregivers.  Patient would benefit from continued PT sessions to address deficits and limitations to reduce decline in functional status and improve safety with functional mobility to reduce risk for falls.   OBJECTIVE IMPAIRMENTS: Abnormal gait, decreased activity tolerance, decreased balance, decreased coordination, decreased endurance, decreased mobility, difficulty walking, decreased ROM, decreased strength, impaired flexibility, impaired tone, impaired  UE functional use, and postural dysfunction.   ACTIVITY LIMITATIONS: carrying, lifting, bending, sitting, standing, squatting, transfers, bed mobility, reach over head, and locomotion level  PARTICIPATION LIMITATIONS: meal prep, cleaning, laundry, interpersonal relationship, community activity, and exercise participation  PERSONAL FACTORS: Age, Past/current experiences, and Time since onset of injury/illness/exacerbation are also affecting patient's functional outcome.   REHAB POTENTIAL: Excellent  CLINICAL DECISION MAKING: Evolving/moderate complexity  EVALUATION COMPLEXITY: Moderate  PLAN:  PT FREQUENCY: 1x/week  PT DURATION: 8 weeks  PLANNED INTERVENTIONS: 97750- Physical Performance Testing, 97110-Therapeutic exercises, 97530- Therapeutic activity, [id number]- Neuromuscular re-education, 97535- Self Care, 02859- Manual therapy, [id number]- Gait training,  [phone]- Orthotic Initial, [phone]- Orthotic/Prosthetic subsequent, [phone]- Aquatic Therapy, [phone]- Electrical stimulation (unattended), and 20560 (1-2 muscles), 20561 (3+ muscles)- Dry Needling  PLAN FOR NEXT SESSION: Berg Balance Test, postural re-education/activities (can he get to prone for things like chin/scap retraction?)   1:09 PM, 03/09/24 M. Kelly Jovee Dettinger, PT, DPT Physical Therapist- [place] Office Number: [phone]

## 2024-03-12 NOTE — Therapy (Signed)
 OUTPATIENT PHYSICAL THERAPY NEURO TREATMENT   Patient Name: Alexander [name] MRN: [id number] DOB:02-Feb-1976, 48 y.o., male Today's Date: 03/15/2024   PCP: [name] LABOR, MD REFERRING PROVIDER: Vear Charlie LABOR, MD  END OF SESSION:  PT End of Session - 03/15/24 1348     Visit Number 2    Number of Visits 9    Date for PT Re-Evaluation 05/04/24    Authorization Type BCBS Medicare    PT Start Time 1316   pt in bathroom during session   PT Stop Time 1402    PT Time Calculation (min) 46 min    Equipment Utilized During Treatment Gait belt    Activity Tolerance Patient tolerated treatment well    Behavior During Therapy WFL for tasks assessed/performed           Past Medical History:  Diagnosis Date   Hypertension    Multiple sclerosis (HCC) sees Dr. 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   Mid back pain 08/13/2018   Transverse myelitis (HCC) 02/19/2018   History of optic neuritis 02/10/2018   Hand weakness 07/10/2016   Spastic gait 03/07/2016   Multiple sclerosis (HCC) 03/07/2016   Urinary dysfunction 03/07/2016   Tremor 03/07/2016    ONSET DATE: 2005, 2012  REFERRING DIAG: G35 (ICD-10-CM) - Multiple sclerosis (HCC) R25.2 (ICD-10-CM) - Spasticity R26.9 (ICD-10-CM) - Gait disturbance M54.9 (ICD-10-CM) - Mid back pain R26.1 (ICD-10-CM) - Spastic gait R53.1 (ICD-10-CM) - Weakness  THERAPY DIAG:  Muscle weakness (generalized)  Unsteadiness on feet  Other abnormalities of gait and mobility  Difficulty in walking, not elsewhere classified  Rationale for Evaluation and Treatment: Rehabilitation  SUBJECTIVE:                                                                                                                                                                                              SUBJECTIVE STATEMENT: I bruised my L foot- had spasticity and kicked my walker. Reports that he is able to WB on it.    Pt accompanied by: self  PERTINENT HISTORY: 48 y.o. man with a relapsing/active form of secondary progressive multiple sclerosis diagnosed in 2003.   Gait and balance are poor.  He uses a Rolator both inside and outside for short distance but a chair for longer distance.  He has a Chief of Staff wheelchair.  With a Rolator, he can go  > 100 feet.   Has some falls abput 1/month.   He does > 1000 steps a day.    He does worse in the heat.  Left arm is worse still but right hand seems to be catching up with more weakness.   Left > right legs have spasticity, worse at night. He takes baclofen  10 mg prn and CBD daily which helps a little.  Some left tremors.  Higher dose of baclofen  caused weakness.  He has poor coordination, left > right. Reports secondary progressive type MS  PAIN:  Are you having pain? No, reports discomfort in the L foot   PRECAUTIONS: Fall  RED FLAGS: None   WEIGHT BEARING RESTRICTIONS: No  FALLS: Has patient fallen in last 6 months? Yes. Number of falls 3, requiring fire department to assist  LIVING ENVIRONMENT: Lives with: lives with their family Lives in: House/apartment Stairs: ramp entrance Has following equipment at home: Vannie - 2 wheeled, Environmental consultant - 4 wheeled, Wheelchair (power), shower chair, Tour manager, and Grab bars  PLOF: Independent with basic ADLs and Independent with household mobility with device,   PATIENT GOALS: improve posture, strength, stability  OBJECTIVE:      TODAY'S TREATMENT: 03/15/24 Activity Comments  Lars 7/56  Stand pivot transfer w/c<>mat with RW Increased time required. PT provides assist in positioning LEs and w/c, locking brakes, providing cues  Unassisted sitting partial sit ups  10x Unassisted sitting pelvic tilts    Pull to stand using grab bar, then pivot steps to turn in order to use commode. Same thing to get off the commode PT provides verbal cueing and   Sitting in w/c: Row red TB 10x Shoulder extension red TB Pelvic tilts  Good tolerance    HOME EXERCISE PROGRAM Last updated: 03/15/24 Access Code: [email or web address] URL: [email or web address] Date: 03/15/2024 Prepared by: [facility] - Outpatient  Rehab - [facility]  Exercises - Seated Shoulder Extension and Scapular Retraction with Resistance  - 1 x daily - 5 x weekly - 2 sets - 10 reps - Seated Shoulder Row with Anchored Resistance  - 1 x daily - 5 x weekly - 2 sets - 10 reps - Seated Pelvic Tilts  - 1 x daily - 5 x weekly - 2 sets - 10 reps   PATIENT EDUCATION: Education details: discussed L foot pain, exam findings, HEP Person educated: Patient Education method: Explanation, Demonstration, Tactile cues, Verbal cues, and Handouts Education comprehension: verbalized understanding and returned demonstration     Note: Objective measures were completed at Evaluation unless otherwise noted.  DIAGNOSTIC FINDINGS: MRI 09/09/2023 showed T2/FLAIR hyperintense foci in the cerebral hemispheres, cerebellum and brainstem in a pattern consistent with chronic demyelinating plaque associated with multiple sclerosis. No other foci enhanced or appear to be acute. Compared to the MRI from 08/22/2022, there were no new lesions.  COGNITION: Overall cognitive status: Within functional limits for tasks assessed   SENSATION: Denies any parasthesias  COORDINATION: Grossly limited   EDEMA:  none  MUSCLE TONE: spasticity BLE, sustained clonus on R, 3-4 bear L. Bilat quads, hamstring flaccid  MUSCLE LENGTH: Hamstrings: Right -10 deg; Left -10 deg   DTRs:  Patella 3+ = Brisk  POSTURE: rounded shoulders, forward head, and increased thoracic kyphosis  LOWER EXTREMITY ROM:     Active  Right Eval  Left Eval  Hip flexion 4 0  Hip extension    Hip abduction  Hip adduction    Hip internal rotation    Hip external rotation    Knee flexion 110 110  Knee extension 0 0  Ankle dorsiflexion 0 5  Ankle plantarflexion    Ankle inversion    Ankle eversion     (Blank rows = not tested)  LOWER EXTREMITY MMT:    MMT Right Eval Left Eval  Hip flexion 2- 2-  Hip extension    Hip abduction 2 2  Hip adduction 2 2  Hip internal rotation    Hip external rotation    Knee flexion 2+ 2  Knee extension 4- 3+  Ankle dorsiflexion 0 2  Ankle plantarflexion    Ankle inversion    Ankle eversion    (Blank rows = not tested)  BED MOBILITY:  Reports he uses a rope on the foot board to long sit->sit EOB  TRANSFERS: Sit to stand: Modified independence  Assistive device utilized: Environmental consultant - 4 wheeled     Stand to sit: Modified independence  Assistive device utilized: Environmental consultant - 4 wheeled     Chair to chair: SBA  Assistive device utilized: Environmental consultant - 4 wheeled       RAMP:  Not tested  CURB:  Not tested  STAIRS: Not tested GAIT: Findings: Gait Characteristics: Right foot flat, wide BOS, and poor foot clearance- Left, Distance walked: 25 ft, Level of assistance: SBA and CGA, and Comments:    FUNCTIONAL TESTS:  5 times sit to stand: 31 sec UE use 2 of 5 Timed up and go (TUG): 1 min 49 sec w/ 5TT Romberg: 23 sec eyes open w/ retro-LOB Berg Balance Test: TBD Function In Sitting Test: 45/56                                                                                                                                TREATMENT DATE:     PATIENT EDUCATION: Education details: assessment details Person educated: Patient Education method: Explanation Education comprehension: verbalized understanding  HOME EXERCISE PROGRAM: TBD  GOALS: Goals reviewed with patient? Yes  SHORT TERM GOALS: Target date: 04/06/2024    Patient will be independent in HEP to improve functional  outcomes Baseline: Goal status: IN PROGRESS  2.  Improve unsupported standing x 60 sec to improve ADL participation Baseline: 23 sec w/ retro-LOB Goal status: IN PROGRESS  3.  Teach-back strategies/techniques/activities to address forward head/rounded shoulder posture to improve upright posture for enhanced balance Baseline:  Goal status: IN PROGRESS    LONG TERM GOALS: Target date: 05/04/2024    Demo improved BLE strength and reduced risk for falls per time 20 sec 5xSTS test Baseline: 31 sec w/ UE support 2 of 5 reps Goal status:IN PROGRESS  2.  Demo improved mobility and reduced risk for falls per time 75 sec TUG test Baseline: 1 min 49 sec w/ 4WW Goal status: IN PROGRESS  3.  Berg Balance Test TBD Baseline:  Goal status:  IN PROGRESS  4.  Ambulation level surfaces x 150 ft w/ modified independence to improve safety and efficiency with household ambulation Baseline: up to 100 ft w/ SBA and 4WW Goal status: IN PROGRESS  5.  Perform car transfers w/ supervision to reduce level of assistance from caregivers  Baseline: min-mod A  Goal status: IN PROGRESS  ASSESSMENT:  CLINICAL IMPRESSION: Patient arrived to session with report of bruising the L foot from kicking the foot out d/t his spasticity and hitting it on his walker. Reports that he has been able to WB on the L LE thus far. Patient scored 7/56 on Berg, indicating an increased risk of falls. Patient reports increased difficulty lifting LEs today to complete tasks, thus increased time was required for today's tasks. Provided HEP with sitting core strengthening activities that pt can perform safely at home. No complaints at end of session.   OBJECTIVE IMPAIRMENTS: Abnormal gait, decreased activity tolerance, decreased balance, decreased coordination, decreased endurance, decreased mobility, difficulty walking, decreased ROM, decreased strength, impaired flexibility, impaired tone, impaired UE functional use, and postural  dysfunction.   ACTIVITY LIMITATIONS: carrying, lifting, bending, sitting, standing, squatting, transfers, bed mobility, reach over head, and locomotion level  PARTICIPATION LIMITATIONS: meal prep, cleaning, laundry, interpersonal relationship, community activity, and exercise participation  PERSONAL FACTORS: Age, Past/current experiences, and Time since onset of injury/illness/exacerbation are also affecting patient's functional outcome.   REHAB POTENTIAL: Excellent  CLINICAL DECISION MAKING: Evolving/moderate complexity  EVALUATION COMPLEXITY: Moderate  PLAN:  PT FREQUENCY: 1x/week  PT DURATION: 8 weeks  PLANNED INTERVENTIONS: 97750- Physical Performance Testing, 97110-Therapeutic exercises, 97530- Therapeutic activity, 97112- Neuromuscular re-education, 97535- Self Care, 02859- Manual therapy, [phone]- Gait training, [phone]- Orthotic Initial, [phone]- Orthotic/Prosthetic subsequent, [phone]- Aquatic Therapy, [phone]- Electrical stimulation (unattended), and 20560 (1-2 muscles), 20561 (3+ muscles)- Dry Needling  PLAN FOR NEXT SESSION: sitting core strengthening, postural re-education/activities (can he get to prone for things like chin/scap retraction?)   Louana Terrilyn Christians, PT, DPT 03/15/24 2:05 PM  [facility] at [facility] [address], [address] [place], [place] [address] Phone # [phone] Fax # [phone]

## 2024-03-15 ENCOUNTER — Ambulatory Visit: Admitting: Physical Therapy

## 2024-03-15 ENCOUNTER — Encounter: Payer: Self-pay | Admitting: Physical Therapy

## 2024-03-15 ENCOUNTER — Ambulatory Visit: Admitting: Occupational Therapy

## 2024-03-15 DIAGNOSIS — R252 Cramp and spasm: Secondary | ICD-10-CM | POA: Diagnosis not present

## 2024-03-15 DIAGNOSIS — M549 Dorsalgia, unspecified: Secondary | ICD-10-CM | POA: Diagnosis not present

## 2024-03-15 DIAGNOSIS — R269 Unspecified abnormalities of gait and mobility: Secondary | ICD-10-CM | POA: Diagnosis not present

## 2024-03-15 DIAGNOSIS — R2689 Other abnormalities of gait and mobility: Secondary | ICD-10-CM | POA: Diagnosis not present

## 2024-03-15 DIAGNOSIS — R208 Other disturbances of skin sensation: Secondary | ICD-10-CM | POA: Diagnosis not present

## 2024-03-15 DIAGNOSIS — R2681 Unsteadiness on feet: Secondary | ICD-10-CM | POA: Diagnosis not present

## 2024-03-15 DIAGNOSIS — R261 Paralytic gait: Secondary | ICD-10-CM | POA: Diagnosis not present

## 2024-03-15 DIAGNOSIS — R531 Weakness: Secondary | ICD-10-CM | POA: Diagnosis not present

## 2024-03-15 DIAGNOSIS — M6281 Muscle weakness (generalized): Secondary | ICD-10-CM

## 2024-03-15 DIAGNOSIS — R278 Other lack of coordination: Secondary | ICD-10-CM

## 2024-03-15 DIAGNOSIS — G35 Multiple sclerosis: Secondary | ICD-10-CM | POA: Diagnosis not present

## 2024-03-15 DIAGNOSIS — R29818 Other symptoms and signs involving the nervous system: Secondary | ICD-10-CM | POA: Diagnosis not present

## 2024-03-15 DIAGNOSIS — R262 Difficulty in walking, not elsewhere classified: Secondary | ICD-10-CM | POA: Diagnosis not present

## 2024-03-15 NOTE — Therapy (Signed)
 OUTPATIENT OCCUPATIONAL THERAPY NEURO  Treatment Note  Patient Name: Alexander [name] MRN: [id number] DOB:April 07, 1976, 48 y.o., male Today's Date: 03/15/2024  PCP: [name] LABOR, MD REFERRING PROVIDER: Vear Charlie LABOR, MD  END OF SESSION:  OT End of Session - 03/15/24 1424     Visit Number 2    Number of Visits 9    Date for OT Re-Evaluation 05/07/24    Authorization Type BCBS Medicare 2025    OT Start Time 1405    OT Stop Time 1445    OT Time Calculation (min) 40 min           Past Medical History:  Diagnosis Date   Hypertension    Multiple sclerosis (HCC) sees Dr. 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   Mid back pain 08/13/2018   Transverse myelitis (HCC) 02/19/2018   History of optic neuritis 02/10/2018   Hand weakness 07/10/2016   Spastic gait 03/07/2016   Multiple sclerosis (HCC) 03/07/2016   Urinary dysfunction 03/07/2016   Tremor 03/07/2016    ONSET DATE: referral date 11/04/23  REFERRING DIAG: G35 (ICD-10-CM) - Multiple sclerosis (HCC) R25.2 (ICD-10-CM) - Spasticity R26.9 (ICD-10-CM) - Gait disturbance M54.9 (ICD-10-CM) - Mid back pain R26.1 (ICD-10-CM) - Spastic gait R53.1 (ICD-10-CM) - Weakness  THERAPY DIAG:  Muscle weakness (generalized)  Other lack of coordination  Other disturbances of skin sensation  Other symptoms and signs involving the nervous system  Rationale for Evaluation and Treatment: Rehabilitation  SUBJECTIVE:   SUBJECTIVE STATEMENT: Pt reports that his spasticity kicked in while he was walking and he kicked his walker, bruising left foot. Pt accompanied by: family member (mother dropped him off)  PERTINENT HISTORY: 48 y.o. man with a  relapsing/active form of secondary progressive multiple sclerosis diagnosed in 2003.   Gait and balance are poor.  He uses a Rolator both inside and outside for short distance but a chair for longer distance.  He has a Chief of Staff wheelchair.  With a Rolator, he can go  > 100 feet.   Has some falls abput 1/month.   He does > 1000 steps a day.    He does worse in the heat.  Left arm is worse still but right hand seems to be catching up with more weakness.   Left > right legs have spasticity, worse at night. He takes baclofen  10 mg prn and CBD daily which helps a little.  Some left tremors.  Higher dose of baclofen  caused weakness.  He has poor coordination, left > right. Reports secondary progressive type MS   PRECAUTIONS: Fall  WEIGHT BEARING RESTRICTIONS: No  PAIN:  Are you having pain? Yes: NPRS scale: very little Pain location: left foot  FALLS: Has patient fallen in last 6 months? Yes. Number of falls 3, requiring fire department to assist  LIVING ENVIRONMENT: Lives with: lives with their family Lives in: House/apartment Stairs: ramp entrance Has following equipment at home: Vannie - 2 wheeled, Environmental consultant - 4 wheeled, Wheelchair (power), shower chair, and Grab bars  PLOF: Independent with basic ADLs, Independent with household mobility with device, and Requires assistive device for independence  PATIENT GOALS: improved posture, functional use of arms and  hands  OBJECTIVE:  Note: Objective measures were completed at Evaluation unless otherwise noted.  HAND DOMINANCE: Right  ADLs: Overall ADLs: I have adapted Transfers/ambulation related to ADLs: uses Rollator for short distances in the home and hand walks into bathroom holding on to counters and railing in bathroom Eating: difficulty with opening containers, scooping foods Grooming: spasticity in arms impacts ease with brushing teeth UB Dressing: Mod I, every once in awhile has some difficulty getting shirt on LB  Dressing: Mod I Toileting: uses railing in bathroom and hand walking to get in and around bathroom Bathing: Mod I Tub Shower transfers: Mod I Equipment: Shower seat with back, Grab bars, and Walk in shower  IADLs: Shopping: mother performs Light housekeeping: mother performs Meal Prep: mother performs Community mobility: mother provides transportation Medication management: Mod I Financial management: Mod I Handwriting: reports he rarely writes anymore  MOBILITY STATUS: Needs Assist: Mod I stand pivot transfers with Rollator, Supervision short distance ambulation with Rollator  POSTURE COMMENTS:  rounded shoulders, forward head, increased thoracic kyphosis, and weight shift left  ACTIVITY TOLERANCE: Activity tolerance: requires a couple days to recover from therapy appts  FUNCTIONAL OUTCOME MEASURES: PSFS   UPPER EXTREMITY ROM:    Active ROM Right eval Left eval  Shoulder flexion 114 130  Shoulder abduction 90 100  Shoulder adduction    Shoulder extension    Shoulder internal rotation    Shoulder external rotation    Elbow flexion [facility] WFL  Elbow extension [facility] WFL  Wrist flexion [facility] WFL  Wrist extension [facility] WFL  Wrist ulnar deviation    Wrist radial deviation    Wrist pronation    Wrist supination    (Blank rows = not tested)  UPPER EXTREMITY MMT:   increased spasticity noted in L with attempts at MMT  MMT Right eval Left eval  Shoulder flexion 3 4-  Shoulder abduction 3+ 4  Shoulder adduction    Shoulder extension    Shoulder internal rotation    Shoulder external rotation    Middle trapezius    Lower trapezius    Elbow flexion 4 4  Elbow extension 4 4  Wrist flexion 3+ 4-  Wrist extension 3+ 4-  Wrist ulnar deviation    Wrist radial deviation    Wrist pronation    Wrist supination    (Blank rows = not tested)  HAND FUNCTION: Grip strength: Right: 40 (40, 36, and 46) lbs; Left: 65 lbs  COORDINATION: 9 Hole Peg test: Right: 2:03 (required  1:35 to place 9 pegs); Left: 2:35 (1:57 to place pegs) Box and Blocks:  Right 25 blocks, Left 18 blocks  SENSATION: Decreased sensation bilaterally, impacting ease with picking up items. I couldn't feel what I was touching.  MUSCLE TONE: increased spasticity LUE > RUE  COGNITION: Overall cognitive status: Within functional limits for tasks assessed  VISION: Subjective report: optic neuritis in the R eye, I have lost vision in the R eye Baseline vision: Wears glasses all the time  TREATMENT DATE:  03/15/24 Grip strength: attempted hand gripper, however pt unable to sustain grasp, even at 25#, to engage in grip strengthening due to width required to initiate grasp over hand gripper. Large grip pegs: transitioned from hand gripper to large grip pegs with resistance peg board. Pt placing pegs into resistance peg board with RUE and then LUE to challenge grasp, manipulation, and motor control.  Pt demonstrating increased tremor on L > R, as well as LUE knocking over pegs intermittently due to decreased motor control.  However pt with increased spasticity in RUE impacting placement and removal of pegs at further reach. OT educating on rotating peg board as needed to facilitate improved reach and motor control. Coordination: attempted rotating large grip peg in finger tips on L, however pt unable to complete and demonstrating mild frustration therefore transitioned to stacking blocks.  Pt able to stack 11 blocks on R and 4 blocks on L.  OT educating on stabilizing at elbow or forearm to increase motor control.  Pt still with difficulty with L >R with attempts at coordination tasks.     03/09/24 Educated on role and purpose of OT as well as potential interventions and goals for therapy based on initial evaluation findings.   Educated on functional grasp and norms for grip  strength and coordination assessments.    PATIENT EDUCATION: Education details: educated on bracing and setup of tasks to increase control and ease Person educated: Patient Education method: Programmer, multimedia, Demonstration, and Verbal cues Education comprehension: verbalized understanding and needs further education  HOME EXERCISE PROGRAM: TBD   GOALS: Goals reviewed with patient? Yes  SHORT TERM GOALS: Target date: 04/09/24  Pt will be independent with gross and fine motor control HEP. Baseline: new to OPOT Goal status: in progress  2.  Pt will demonstrate improved UE functional use for ADLs as evidenced by increasing box/ blocks score by 4 blocks with BUE Baseline: Right 25 blocks, Left 18 blocks Goal status: in progress  3.  Pt will verbalize understanding of energy conservation techniques.  Baseline: new to OPOT Goal status: in progress  4.  Pt will verbalize understanding of task modifications and/or potential DME / AE needs to increase ease, safety, and independence w/ ADLs. Baseline:  Goal status: in progress  5.  Pt will verbalize understanding of safety concerns and precautions due to impaired sensation in BUE. Baseline: impaired sensation Goal status: in progress    LONG TERM GOALS: Target date: 05/07/24  Pt will demonstrate improved UE functional use for ADLs as evidenced by increasing box/ blocks score by 6 blocks with BUE Baseline: Right 25 blocks, Left 18 blocks Goal status: in progress  2.  Pt will demonstrate improved trunk/postural control with ability to shift in and out of midline and recognize when losing balance during seated or standing ADLs. Baseline: Left lean in unsupported position Goal status: in progress  3.   Pt will demonstrate improved grip strength by 5# on R to increase ease with opening grooming and food containers. Baseline: R: 40# Goal status: in progress  4.  Pt will perform dynamic standing task for 5 mins for simple IADLS w/o LOB  using DME and/or countertop support prn Baseline: Right lean in standing Goal status: in progress  5.  Pt will demonstrate improved shoulder flexion on RUE by 10# to allow for increased reach and strength to remove light-moderate weight items as needed for ADLs and IADLs.  Baseline: RUE 3- to 4 Goal status: in progress  6.  Patient will report at least two-point increase in average PSFS score or at least three-point increase in a single activity score indicating functionally significant improvement given minimum detectable change. Baseline: 4.3 Goal status: in progress  ASSESSMENT:  CLINICAL IMPRESSION: Patient is a 48 y.o. male who was seen today for occupational therapy treatment session for weakness and spasticity due to multiple sclerosis. Pt demonstrates greater weakness in dominant RUE, however increased spasticity bilaterally this session impacting coordination and motor control.  Pt demonstrating decreased trunk control this session, especially when reaching further away, benefiting from cues to challenge core with functional reach.    PERFORMANCE DEFICITS: in functional skills including ADLs, IADLs, coordination, sensation, ROM, strength, flexibility, Fine motor control, Gross motor control, balance, body mechanics, endurance, decreased knowledge of precautions, decreased knowledge of use of DME, and UE functional use and psychosocial skills including coping strategies, environmental adaptation, and routines and behaviors.    PLAN:  OT FREQUENCY: 1x/week  OT DURATION: 8 weeks  PLANNED INTERVENTIONS: 97168 OT Re-evaluation, 97535 self care/ADL training, 02889 therapeutic exercise, 97530 therapeutic activity, 97112 neuromuscular re-education, 97140 manual therapy, 97113 aquatic therapy, 97035 ultrasound, balance training, functional mobility training, psychosocial skills training, energy conservation, coping strategies training, patient/family education, and DME and/or AE  instructions  RECOMMENDED OTHER SERVICES: NA  CONSULTED AND AGREED WITH PLAN OF CARE: Patient  PLAN FOR NEXT SESSION: initiate coordination and strengthening HEP, may benefit from putty for grip strength, engage in unsupported sitting progressing to standing incorporating weight shifting and reaching.   KAYLENE DOMINO, OTR/L 03/15/2024, 2:24 PM  [facility] at [facility] [address] [place], [address] [place], [place] [address] Phone # [phone] Fax # [phone]

## 2024-03-17 ENCOUNTER — Encounter: Payer: Self-pay | Admitting: Family Medicine

## 2024-03-17 ENCOUNTER — Ambulatory Visit (INDEPENDENT_AMBULATORY_CARE_PROVIDER_SITE_OTHER): Admitting: Family Medicine

## 2024-03-17 VITALS — BP 142/90 | HR 68 | Temp 97.5°F | Wt 160.0 lb

## 2024-03-17 DIAGNOSIS — S9032XA Contusion of left foot, initial encounter: Secondary | ICD-10-CM | POA: Diagnosis not present

## 2024-03-17 MED ORDER — BACLOFEN 10 MG PO TABS: ORAL_TABLET | ORAL | Status: AC

## 2024-03-17 NOTE — Progress Notes (Signed)
   Subjective:    Patient ID: Alexander [name], male    DOB: Apr 20, 1976, 48 y.o.   MRN: [id number]  HPI Here for an injury to the left foot. This occurred at home one week ago. While he was moving with his rolling walker he had a spasm in the left leg. This caused his left foot to slam against the brakes on his walker. It swelled up quickly and has been very bruised. He does not have much pain, but of course he lacks a lot of sensation in the foot due to MS. He has applied ice a few times.    Review of Systems  Constitutional: Negative.   Respiratory: Negative.    Cardiovascular: Negative.   Musculoskeletal:  Positive for arthralgias.       Objective:   Physical Exam Constitutional:      General: He is not in acute distress.    Comments: In a wheelchair   Cardiovascular:     Rate and Rhythm: Normal rate and regular rhythm.     Pulses: Normal pulses.     Heart sounds: Normal heart sounds.  Pulmonary:     Effort: Pulmonary effort is normal.     Breath sounds: Normal breath sounds.  Musculoskeletal:     Comments: The left foot is swollen and quite ecchymotic, particularly on the arch and the toes. There is an abrasion on the lateral side of the foot. He is not tender. There is no crepitus.   Neurological:     Mental Status: He is alert.           Assessment & Plan:  Foot contusion. With the degree of bruising he has, I think it very likely that he has a metatarsal fracture. We will set up Xrays of the foot.  Garnette Olmsted, MD

## 2024-03-19 ENCOUNTER — Other Ambulatory Visit

## 2024-03-19 ENCOUNTER — Ambulatory Visit: Payer: Self-pay | Admitting: Family Medicine

## 2024-03-19 ENCOUNTER — Ambulatory Visit (INDEPENDENT_AMBULATORY_CARE_PROVIDER_SITE_OTHER)

## 2024-03-19 DIAGNOSIS — S99922A Unspecified injury of left foot, initial encounter: Secondary | ICD-10-CM | POA: Diagnosis not present

## 2024-03-19 DIAGNOSIS — S9032XA Contusion of left foot, initial encounter: Secondary | ICD-10-CM

## 2024-03-23 ENCOUNTER — Ambulatory Visit: Admitting: Occupational Therapy

## 2024-03-23 ENCOUNTER — Ambulatory Visit: Admitting: Physical Therapy

## 2024-03-23 ENCOUNTER — Encounter: Payer: Self-pay | Admitting: Physical Therapy

## 2024-03-23 DIAGNOSIS — R278 Other lack of coordination: Secondary | ICD-10-CM | POA: Diagnosis not present

## 2024-03-23 DIAGNOSIS — R29818 Other symptoms and signs involving the nervous system: Secondary | ICD-10-CM | POA: Diagnosis not present

## 2024-03-23 DIAGNOSIS — R2681 Unsteadiness on feet: Secondary | ICD-10-CM

## 2024-03-23 DIAGNOSIS — R208 Other disturbances of skin sensation: Secondary | ICD-10-CM

## 2024-03-23 DIAGNOSIS — M549 Dorsalgia, unspecified: Secondary | ICD-10-CM | POA: Diagnosis not present

## 2024-03-23 DIAGNOSIS — R2689 Other abnormalities of gait and mobility: Secondary | ICD-10-CM

## 2024-03-23 DIAGNOSIS — R262 Difficulty in walking, not elsewhere classified: Secondary | ICD-10-CM | POA: Diagnosis not present

## 2024-03-23 DIAGNOSIS — R252 Cramp and spasm: Secondary | ICD-10-CM | POA: Diagnosis not present

## 2024-03-23 DIAGNOSIS — M6281 Muscle weakness (generalized): Secondary | ICD-10-CM

## 2024-03-23 DIAGNOSIS — R531 Weakness: Secondary | ICD-10-CM | POA: Diagnosis not present

## 2024-03-23 DIAGNOSIS — G35 Multiple sclerosis: Secondary | ICD-10-CM | POA: Diagnosis not present

## 2024-03-23 DIAGNOSIS — R269 Unspecified abnormalities of gait and mobility: Secondary | ICD-10-CM | POA: Diagnosis not present

## 2024-03-23 DIAGNOSIS — R261 Paralytic gait: Secondary | ICD-10-CM | POA: Diagnosis not present

## 2024-03-23 NOTE — Therapy (Signed)
 OUTPATIENT PHYSICAL THERAPY NEURO TREATMENT   Patient Name: Alexander [name] MRN: [id number] DOB:01-17-1976, 48 y.o., male Today's Date: 03/23/2024   PCP: [name] LABOR, MD REFERRING PROVIDER: Vear Charlie LABOR, MD  END OF SESSION:  PT End of Session - 03/23/24 1006     Visit Number 3    Number of Visits 9    Date for PT Re-Evaluation 05/04/24    Authorization Type [employer] Medicare    PT Start Time 1015    PT Stop Time 1055    PT Time Calculation (min) 40 min    Equipment Utilized During Treatment Gait belt    Activity Tolerance Patient tolerated treatment well    Behavior During Therapy WFL for tasks assessed/performed            Past Medical History:  Diagnosis Date   Hypertension    Multiple sclerosis (HCC) sees Dr. 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   Mid back pain 08/13/2018   Transverse myelitis (HCC) 02/19/2018   History of optic neuritis 02/10/2018   Hand weakness 07/10/2016   Spastic gait 03/07/2016   Multiple sclerosis (HCC) 03/07/2016   Urinary dysfunction 03/07/2016   Tremor 03/07/2016    ONSET DATE: 2005, 2012  REFERRING DIAG: G35 (ICD-10-CM) - Multiple sclerosis (HCC) R25.2 (ICD-10-CM) - Spasticity R26.9 (ICD-10-CM) - Gait disturbance M54.9 (ICD-10-CM) - Mid back pain R26.1 (ICD-10-CM) - Spastic gait R53.1 (ICD-10-CM) - Weakness  THERAPY DIAG:  Unsteadiness on feet  Other lack of coordination  Other symptoms and signs involving the nervous system  Other abnormalities of gait and mobility  Other disturbances of skin sensation  Muscle weakness (generalized)  Difficulty in walking, not elsewhere classified  Rationale for Evaluation and Treatment:  Rehabilitation  SUBJECTIVE:                                                                                                                                                                                             SUBJECTIVE STATEMENT: Pt states his foot is doing okay -- it's sore but not fractured.    Pt accompanied by: self  PERTINENT HISTORY: 48 y.o. man with a relapsing/active form of secondary progressive multiple sclerosis diagnosed in 2003.   Gait and balance are poor.  He uses a Museum/gallery exhibitions officer both inside and outside for  short distance but a chair for longer distance.  He has a Chief of Staff wheelchair.  With a Rolator, he can go  > 100 feet.   Has some falls abput 1/month.   He does > 1000 steps a day.    He does worse in the heat.  Left arm is worse still but right hand seems to be catching up with more weakness.   Left > right legs have spasticity, worse at night. He takes baclofen  10 mg prn and CBD daily which helps a little.  Some left tremors.  Higher dose of baclofen  caused weakness.  He has poor coordination, left > right. Reports secondary progressive type MS  PAIN:  Are you having pain? No, reports discomfort in the L foot   PRECAUTIONS: Fall  RED FLAGS: None   WEIGHT BEARING RESTRICTIONS: No  FALLS: Has patient fallen in last 6 months? Yes. Number of falls 3, requiring fire department to assist  LIVING ENVIRONMENT: Lives with: lives with their family Lives in: House/apartment Stairs: ramp entrance Has following equipment at home: Environmental consultant - 2 wheeled, Environmental consultant - 4 wheeled, Wheelchair (power), shower chair, Tour manager, and Grab bars  PLOF: Independent with basic ADLs and Independent with household mobility with device,   PATIENT GOALS: improve posture, strength, stability  OBJECTIVE:   TODAY'S TREATMENT: 03/23/2024 Activity Comments  Seated Pball flexion/ext 2x10 Pball ab setting 10x3 Pball lumbar ext into partial sit up x20 SLR 2x10 Row red TB  2x10 Shoulder extension red TB 2x10 Palloff press red TB 2x10 Ant/post pelvic tilt   Pull to stand in // bars: Scap squeeze 2x10 UE raises for low trap setting x10   W/c to car t/f mod A for LE negotiation into SUV Able to perform stand pivot with SBA but requires assist to lift LEs up            HOME EXERCISE PROGRAM Last updated: 03/15/24 Access Code: [email or web address] URL: [email or web address] Date: 03/15/2024 Prepared by: [facility] - Outpatient  Rehab - [facility]  Exercises - Seated Shoulder Extension and Scapular Retraction with Resistance  - 1 x daily - 5 x weekly - 2 sets - 10 reps - Seated Shoulder Row with Anchored Resistance  - 1 x daily - 5 x weekly - 2 sets - 10 reps - Seated Pelvic Tilts  - 1 x daily - 5 x weekly - 2 sets - 10 reps   PATIENT EDUCATION: Education details: discussed L foot pain, exam findings, HEP Person educated: Patient Education method: Explanation, Demonstration, Tactile cues, Verbal cues, and Handouts Education comprehension: verbalized understanding and returned demonstration     Note: Objective measures were completed at Evaluation unless otherwise noted.  DIAGNOSTIC FINDINGS: MRI 09/09/2023 showed T2/FLAIR hyperintense foci in the cerebral hemispheres, cerebellum and brainstem in a pattern consistent with chronic demyelinating plaque associated with multiple sclerosis. No other foci enhanced or appear to be acute. Compared to the MRI from 08/22/2022, there were no new lesions.  COGNITION: Overall cognitive status: Within functional limits for tasks assessed   SENSATION: Denies any parasthesias  COORDINATION: Grossly limited   EDEMA:  none  MUSCLE TONE: spasticity BLE, sustained clonus on R, 3-4 bear L. Bilat quads, hamstring flaccid  MUSCLE LENGTH: Hamstrings: Right -10 deg; Left -10 deg   DTRs:  Patella 3+ = Brisk  POSTURE: rounded shoulders, forward head, and increased thoracic kyphosis  LOWER EXTREMITY ROM:      Active  Right Eval Left Eval  Hip flexion 4 0  Hip extension    Hip abduction    Hip adduction    Hip internal rotation    Hip external rotation    Knee flexion 110 110  Knee extension 0 0  Ankle dorsiflexion 0 5  Ankle plantarflexion    Ankle inversion    Ankle eversion     (Blank rows = not tested)  LOWER EXTREMITY MMT:    MMT Right Eval Left Eval  Hip flexion 2- 2-  Hip extension    Hip abduction 2 2  Hip adduction 2 2  Hip internal rotation    Hip external rotation    Knee flexion 2+ 2  Knee extension 4- 3+  Ankle dorsiflexion 0 2  Ankle plantarflexion    Ankle inversion    Ankle eversion    (Blank rows = not tested)  BED MOBILITY:  Reports he uses a rope on the foot board to long sit->sit EOB  TRANSFERS: Sit to stand: Modified independence  Assistive device utilized: Environmental consultant - 4 wheeled     Stand to sit: Modified independence  Assistive device utilized: Environmental consultant - 4 wheeled     Chair to chair: SBA  Assistive device utilized: Environmental consultant - 4 wheeled       RAMP:  Not tested  CURB:  Not tested  STAIRS: Not tested GAIT: Findings: Gait Characteristics: Right foot flat, wide BOS, and poor foot clearance- Left, Distance walked: 25 ft, Level of assistance: SBA and CGA, and Comments:    FUNCTIONAL TESTS:  5 times sit to stand: 31 sec UE use 2 of 5 Timed up and go (TUG): 1 min 49 sec w/ 5TT Romberg: 23 sec eyes open w/ retro-LOB Berg Balance Test: TBD Function In Sitting Test: 45/56                                                                                                                                TREATMENT DATE:     PATIENT EDUCATION: Education details: assessment details Person educated: Patient Education method: Explanation Education comprehension: verbalized understanding  HOME EXERCISE PROGRAM: TBD  GOALS: Goals reviewed with patient? Yes  SHORT TERM GOALS: Target date: 04/06/2024    Patient will be independent in HEP to improve  functional outcomes Baseline: Goal status: IN PROGRESS  2.  Improve unsupported standing x 60 sec to improve ADL participation Baseline: 23 sec w/ retro-LOB Goal status: IN PROGRESS  3.  Teach-back strategies/techniques/activities to address forward head/rounded shoulder posture to improve upright posture for enhanced balance Baseline:  Goal status: IN PROGRESS    LONG TERM GOALS: Target date: 05/04/2024    Demo improved BLE strength and reduced risk for falls per time 20 sec 5xSTS test Baseline: 31 sec w/ UE support 2 of 5 reps Goal status:IN PROGRESS  2.  Demo improved mobility and reduced risk for falls per time 75 sec TUG test Baseline: 1 min 49 sec w/ 4WW Goal status: IN PROGRESS  3.  Berg Balance Test TBD Baseline:  Goal status: IN PROGRESS  4.  Ambulation level surfaces x 150 ft w/ modified independence to improve safety and efficiency with household ambulation Baseline: up to 100 ft w/ SBA and 4WW Goal status: IN PROGRESS  5.  Perform car transfers w/ supervision to reduce level of assistance from caregivers  Baseline: min-mod A  Goal status: IN PROGRESS  ASSESSMENT:  CLINICAL IMPRESSION: Continued to work on trunk and core strength/stability. Appears more weak along the sides of his core (I.e. obliques) with L weaker than R. Continued to work on improving trunk extensor strength and pelvic mobility. Becomes off balance with pelvic tilting. May benefit from using dynadisc next session to further improve seated/pelvic stability/mobility.   OBJECTIVE IMPAIRMENTS: Abnormal gait, decreased activity tolerance, decreased balance, decreased coordination, decreased endurance, decreased mobility, difficulty walking, decreased ROM, decreased strength, impaired flexibility, impaired tone, impaired UE functional use, and postural dysfunction.   ACTIVITY LIMITATIONS: carrying, lifting, bending, sitting, standing, squatting, transfers, bed mobility, reach over head, and  locomotion level  PARTICIPATION LIMITATIONS: meal prep, cleaning, laundry, interpersonal relationship, community activity, and exercise participation  PERSONAL FACTORS: Age, Past/current experiences, and Time since onset of injury/illness/exacerbation are also affecting patient's functional outcome.   REHAB POTENTIAL: Excellent  CLINICAL DECISION MAKING: Evolving/moderate complexity  EVALUATION COMPLEXITY: Moderate  PLAN:  PT FREQUENCY: 1x/week  PT DURATION: 8 weeks  PLANNED INTERVENTIONS: 97750- Physical Performance Testing, 97110-Therapeutic exercises, 97530- Therapeutic activity, 97112- Neuromuscular re-education, [phone]- Self Care, 02859- Manual therapy, [phone]- Gait training, [phone]- Orthotic Initial, [phone]- Orthotic/Prosthetic subsequent, [phone]- Aquatic Therapy, [phone]- Electrical stimulation (unattended), and 20560 (1-2 muscles), 20561 (3+ muscles)- Dry Needling  PLAN FOR NEXT SESSION: sitting core strengthening, postural re-education/activities (can he get to prone for things like chin/scap retraction?)   Karah Caruthers April Ma L Camdyn Laden, PT, DPT 03/23/24 10:06 AM  [facility] at [facility] [address] [place], [place] [address] Phone # [phone] Fax # [phone]

## 2024-03-23 NOTE — Therapy (Signed)
 OUTPATIENT OCCUPATIONAL THERAPY NEURO  Treatment Note  Patient Name: Alexander [name] MRN: [id number] DOB:Dec 04, 1975, 48 y.o., male Today's Date: 03/23/2024  PCP: [name] LABOR, MD REFERRING PROVIDER: Vear Charlie LABOR, MD  END OF SESSION:  OT End of Session - 03/23/24 0954     Visit Number 3    Number of Visits 9    Date for OT Re-Evaluation 05/07/24    Authorization Type [employer] Medicare 2025    OT Start Time 0933    OT Stop Time 1013    OT Time Calculation (min) 40 min            Past Medical History:  Diagnosis Date   Hypertension    Multiple sclerosis (HCC) sees Dr. 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   Mid back pain 08/13/2018   Transverse myelitis (HCC) 02/19/2018   History of optic neuritis 02/10/2018   Hand weakness 07/10/2016   Spastic gait 03/07/2016   Multiple sclerosis (HCC) 03/07/2016   Urinary dysfunction 03/07/2016   Tremor 03/07/2016    ONSET DATE: referral date 11/04/23  REFERRING DIAG: G35 (ICD-10-CM) - Multiple sclerosis (HCC) R25.2 (ICD-10-CM) - Spasticity R26.9 (ICD-10-CM) - Gait disturbance M54.9 (ICD-10-CM) - Mid back pain R26.1 (ICD-10-CM) - Spastic gait R53.1 (ICD-10-CM) - Weakness  THERAPY DIAG:  Unsteadiness on feet  Other abnormalities of gait and mobility  Other lack of coordination  Other disturbances of skin sensation  Other symptoms and signs involving the nervous system  Rationale for Evaluation and Treatment: Rehabilitation  SUBJECTIVE:   SUBJECTIVE STATEMENT: Pt reports that he saw the dr about his foot and reports that he did not break it.   Pt accompanied by: family member (mother dropped him off)  PERTINENT HISTORY: 48 y.o. man  with a relapsing/active form of secondary progressive multiple sclerosis diagnosed in 2003.   Gait and balance are poor.  He uses a Rolator both inside and outside for short distance but a chair for longer distance.  He has a Chief of Staff wheelchair.  With a Rolator, he can go  > 100 feet.   Has some falls abput 1/month.   He does > 1000 steps a day.    He does worse in the heat.  Left arm is worse still but right hand seems to be catching up with more weakness.   Left > right legs have spasticity, worse at night. He takes baclofen  10 mg prn and CBD daily which helps a little.  Some left tremors.  Higher dose of baclofen  caused weakness.  He has poor coordination, left > right. Reports secondary progressive type MS   PRECAUTIONS: Fall  WEIGHT BEARING RESTRICTIONS: No  PAIN:  Are you having pain? Yes: NPRS scale: 2-3/10 Pain location: lower back; ache  FALLS: Has patient fallen in last 6 months? Yes. Number of falls 3, requiring fire department to assist  LIVING ENVIRONMENT: Lives with: lives with their family Lives in: House/apartment Stairs: ramp entrance Has following equipment at home: Vannie - 2 wheeled, Environmental consultant - 4 wheeled, Wheelchair (power), shower chair, and Grab bars  PLOF: Independent with basic ADLs, Independent with household mobility with device, and Requires assistive device for independence  PATIENT GOALS: improved posture, functional use of arms and hands  OBJECTIVE:  Note: Objective measures were completed at Evaluation unless otherwise noted.  HAND DOMINANCE: Right  ADLs: Overall ADLs: I have adapted Transfers/ambulation related to ADLs: uses Rollator for short distances in the home and hand walks into bathroom holding on to counters and railing in bathroom Eating: difficulty with opening containers, scooping foods Grooming: spasticity in arms impacts ease with brushing teeth UB Dressing: Mod I, every once in awhile has some difficulty getting shirt  on LB Dressing: Mod I Toileting: uses railing in bathroom and hand walking to get in and around bathroom Bathing: Mod I Tub Shower transfers: Mod I Equipment: Shower seat with back, Grab bars, and Walk in shower  IADLs: Shopping: mother performs Light housekeeping: mother performs Meal Prep: mother performs Community mobility: mother provides transportation Medication management: Mod I Financial management: Mod I Handwriting: reports he rarely writes anymore  MOBILITY STATUS: Needs Assist: Mod I stand pivot transfers with Rollator, Supervision short distance ambulation with Rollator  POSTURE COMMENTS:  rounded shoulders, forward head, increased thoracic kyphosis, and weight shift left  ACTIVITY TOLERANCE: Activity tolerance: requires a couple days to recover from therapy appts  FUNCTIONAL OUTCOME MEASURES: PSFS   UPPER EXTREMITY ROM:    Active ROM Right eval Left eval  Shoulder flexion 114 130  Shoulder abduction 90 100  Shoulder adduction    Shoulder extension    Shoulder internal rotation    Shoulder external rotation    Elbow flexion [facility] WFL  Elbow extension [facility] WFL  Wrist flexion [facility] WFL  Wrist extension [facility] WFL  Wrist ulnar deviation    Wrist radial deviation    Wrist pronation    Wrist supination    (Blank rows = not tested)  UPPER EXTREMITY MMT:   increased spasticity noted in L with attempts at MMT  MMT Right eval Left eval  Shoulder flexion 3 4-  Shoulder abduction 3+ 4  Shoulder adduction    Shoulder extension    Shoulder internal rotation    Shoulder external rotation    Middle trapezius    Lower trapezius    Elbow flexion 4 4  Elbow extension 4 4  Wrist flexion 3+ 4-  Wrist extension 3+ 4-  Wrist ulnar deviation    Wrist radial deviation    Wrist pronation    Wrist supination    (Blank rows = not tested)  HAND FUNCTION: Grip strength: Right: 40 (40, 36, and 46) lbs; Left: 65 lbs  COORDINATION: 9 Hole Peg test: Right: 2:03  (required 1:35 to place 9 pegs); Left: 2:35 (1:57 to place pegs) Box and Blocks:  Right 25 blocks, Left 18 blocks  SENSATION: Decreased sensation bilaterally, impacting ease with picking up items. I couldn't feel what I was touching.  MUSCLE TONE: increased spasticity LUE > RUE  COGNITION: Overall cognitive status: Within functional limits for tasks assessed  VISION: Subjective report: optic neuritis in the R eye, I have lost vision in the R eye Baseline vision: Wears glasses all the time  TREATMENT DATE:  03/23/24 NMR: engaged in sitting balance and trunk control with reaching across midline, outside BOS, and towards high and low surfaces to challenge weight shifting and trunk control.  Initiated task with large jacks for coordination and placing them into matching colored cones.  Increased challenge with use of resistance clothespins (2#, 4#)  to place onto vertical dowel.  Pt with increased difficulty with 4# clothespins with RUE, but able to complete with LUE. Pt with good ability to weight shift right/left and forwards/backwards.  OT providing intermittent cues for increased weight shift and upright reach.  Pt with increased difficulty when reaching across midline with RUE, decreased weight shift left and trunk elongation on left.  Pt reports increased fatigue and with 1 posterior LOB when reaching to left. Transitional movements: pt completing stand pivot transfer to Left with UE support on back of chair and close supervision.      03/15/24 Grip strength: attempted hand gripper, however pt unable to sustain grasp, even at 25#, to engage in grip strengthening due to width required to initiate grasp over hand gripper. Large grip pegs: transitioned from hand gripper to large grip pegs with resistance peg board. Pt placing pegs into resistance peg board with RUE and  then LUE to challenge grasp, manipulation, and motor control.  Pt demonstrating increased tremor on L > R, as well as LUE knocking over pegs intermittently due to decreased motor control.  However pt with increased spasticity in RUE impacting placement and removal of pegs at further reach. OT educating on rotating peg board as needed to facilitate improved reach and motor control.  OT providing suggestions to carry over this task into home during functional tasks to challenge postural control and weight shifting. Coordination: attempted rotating large grip peg in finger tips on L, however pt unable to complete and demonstrating mild frustration therefore transitioned to stacking blocks.  Pt able to stack 11 blocks on R and 4 blocks on L.  OT educating on stabilizing at elbow or forearm to increase motor control.  Pt still with difficulty with L >R with attempts at coordination tasks.     03/09/24 Educated on role and purpose of OT as well as potential interventions and goals for therapy based on initial evaluation findings.   Educated on functional grasp and norms for grip strength and coordination assessments.    PATIENT EDUCATION: Education details: educated on functional tasks to carryover weight shifting and postural control at home Person educated: Patient Education method: Programmer, multimedia, Demonstration, and Verbal cues Education comprehension: verbalized understanding and needs further education  HOME EXERCISE PROGRAM: TBD   GOALS: Goals reviewed with patient? Yes  SHORT TERM GOALS: Target date: 04/09/24  Pt will be independent with gross and fine motor control HEP. Baseline: new to OPOT Goal status: in progress  2.  Pt will demonstrate improved UE functional use for ADLs as evidenced by increasing box/ blocks score by 4 blocks with BUE Baseline: Right 25 blocks, Left 18 blocks Goal status: in progress  3.  Pt will verbalize understanding of energy conservation techniques.  Baseline:  new to OPOT Goal status: in progress  4.  Pt will verbalize understanding of task modifications and/or potential DME / AE needs to increase ease, safety, and independence w/ ADLs. Baseline:  Goal status: in progress  5.  Pt will verbalize understanding of safety concerns and precautions due to impaired sensation in BUE. Baseline: impaired sensation Goal status: in progress    LONG TERM GOALS: Target date:  05/07/24  Pt will demonstrate improved UE functional use for ADLs as evidenced by increasing box/ blocks score by 6 blocks with BUE Baseline: Right 25 blocks, Left 18 blocks Goal status: in progress  2.  Pt will demonstrate improved trunk/postural control with ability to shift in and out of midline and recognize when losing balance during seated or standing ADLs. Baseline: Left lean in unsupported position Goal status: in progress  3.   Pt will demonstrate improved grip strength by 5# on R to increase ease with opening grooming and food containers. Baseline: R: 40# Goal status: in progress  4.  Pt will perform dynamic standing task for 5 mins for simple IADLS w/o LOB using DME and/or countertop support prn Baseline: Right lean in standing Goal status: in progress  5.  Pt will demonstrate improved shoulder flexion on RUE by 10# to allow for increased reach and strength to remove light-moderate weight items as needed for ADLs and IADLs.  Baseline: RUE 3- to 4 Goal status: in progress  6.  Patient will report at least two-point increase in average PSFS score or at least three-point increase in a single activity score indicating functionally significant improvement given minimum detectable change. Baseline: 4.3 Goal status: in progress  ASSESSMENT:  CLINICAL IMPRESSION: Patient is a 48 y.o. male who was seen today for occupational therapy treatment session for weakness and spasticity due to multiple sclerosis. Pt demonstrates more difficulty with weight shifting to L over R,  benefiting from intermittent tactile cues and support with 1 LOB backwards.  Pt continues to demonstrate decreased grip strength with R compared to L with resistance clothespins.  Pt receptive to suggestions to carryover task to home during functional activities.    PERFORMANCE DEFICITS: in functional skills including ADLs, IADLs, coordination, sensation, ROM, strength, flexibility, Fine motor control, Gross motor control, balance, body mechanics, endurance, decreased knowledge of precautions, decreased knowledge of use of DME, and UE functional use and psychosocial skills including coping strategies, environmental adaptation, and routines and behaviors.    PLAN:  OT FREQUENCY: 1x/week  OT DURATION: 8 weeks  PLANNED INTERVENTIONS: 97168 OT Re-evaluation, 97535 self care/ADL training, 02889 therapeutic exercise, 97530 therapeutic activity, 97112 neuromuscular re-education, 97140 manual therapy, 97113 aquatic therapy, 97035 ultrasound, balance training, functional mobility training, psychosocial skills training, energy conservation, coping strategies training, patient/family education, and DME and/or AE instructions  RECOMMENDED OTHER SERVICES: NA  CONSULTED AND AGREED WITH PLAN OF CARE: Patient  PLAN FOR NEXT SESSION: initiate coordination and strengthening HEP, may benefit from putty for grip strength, engage in unsupported sitting progressing to standing incorporating weight shifting and reaching.   KAYLENE DOMINO, OTR/L 03/23/2024, 9:56 AM  [facility] at [facility] [address] [place], [address] [place], [place] [address] Phone # [phone] Fax # [phone]

## 2024-04-01 ENCOUNTER — Ambulatory Visit: Admitting: Occupational Therapy

## 2024-04-01 ENCOUNTER — Ambulatory Visit

## 2024-04-01 DIAGNOSIS — R278 Other lack of coordination: Secondary | ICD-10-CM

## 2024-04-01 DIAGNOSIS — R262 Difficulty in walking, not elsewhere classified: Secondary | ICD-10-CM

## 2024-04-01 DIAGNOSIS — R531 Weakness: Secondary | ICD-10-CM | POA: Diagnosis not present

## 2024-04-01 DIAGNOSIS — M6281 Muscle weakness (generalized): Secondary | ICD-10-CM

## 2024-04-01 DIAGNOSIS — R261 Paralytic gait: Secondary | ICD-10-CM | POA: Diagnosis not present

## 2024-04-01 DIAGNOSIS — R252 Cramp and spasm: Secondary | ICD-10-CM | POA: Diagnosis not present

## 2024-04-01 DIAGNOSIS — R29818 Other symptoms and signs involving the nervous system: Secondary | ICD-10-CM | POA: Diagnosis not present

## 2024-04-01 DIAGNOSIS — M549 Dorsalgia, unspecified: Secondary | ICD-10-CM | POA: Diagnosis not present

## 2024-04-01 DIAGNOSIS — G35 Multiple sclerosis: Secondary | ICD-10-CM | POA: Diagnosis not present

## 2024-04-01 DIAGNOSIS — R2689 Other abnormalities of gait and mobility: Secondary | ICD-10-CM

## 2024-04-01 DIAGNOSIS — R2681 Unsteadiness on feet: Secondary | ICD-10-CM | POA: Diagnosis not present

## 2024-04-01 DIAGNOSIS — R269 Unspecified abnormalities of gait and mobility: Secondary | ICD-10-CM | POA: Diagnosis not present

## 2024-04-01 DIAGNOSIS — R208 Other disturbances of skin sensation: Secondary | ICD-10-CM | POA: Diagnosis not present

## 2024-04-01 NOTE — Therapy (Signed)
 OUTPATIENT OCCUPATIONAL THERAPY NEURO  Treatment Note  Patient Name: Alexander [name] MRN: [id number] DOB:February 06, 1976, 48 y.o., male Today's Date: 04/01/2024  PCP: [name] LABOR, MD REFERRING PROVIDER: Vear Charlie LABOR, MD  END OF SESSION:  OT End of Session - 04/01/24 1049     Visit Number 4    Number of Visits 9    Date for OT Re-Evaluation 05/07/24    Authorization Type [employer] Medicare 2025    OT Start Time 1020    OT Stop Time 1100    OT Time Calculation (min) 40 min             Past Medical History:  Diagnosis Date   Hypertension    Multiple sclerosis (HCC) sees Dr. 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   Mid back pain 08/13/2018   Transverse myelitis (HCC) 02/19/2018   History of optic neuritis 02/10/2018   Hand weakness 07/10/2016   Spastic gait 03/07/2016   Multiple sclerosis (HCC) 03/07/2016   Urinary dysfunction 03/07/2016   Tremor 03/07/2016    ONSET DATE: referral date 11/04/23  REFERRING DIAG: G35 (ICD-10-CM) - Multiple sclerosis (HCC) R25.2 (ICD-10-CM) - Spasticity R26.9 (ICD-10-CM) - Gait disturbance M54.9 (ICD-10-CM) - Mid back pain R26.1 (ICD-10-CM) - Spastic gait R53.1 (ICD-10-CM) - Weakness  THERAPY DIAG:  Other symptoms and signs involving the nervous system  Muscle weakness (generalized)  Other lack of coordination  Rationale for Evaluation and Treatment: Rehabilitation  SUBJECTIVE:   SUBJECTIVE STATEMENT: Pt reports that he has had an uneventful week which we like.   Pt accompanied by: family member (mother dropped him off)  PERTINENT HISTORY: 48 y.o. man with a relapsing/active form of secondary progressive multiple sclerosis diagnosed in 2003.    Gait and balance are poor.  He uses a Rolator both inside and outside for short distance but a chair for longer distance.  He has a Chief of Staff wheelchair.  With a Rolator, he can go  > 100 feet.   Has some falls abput 1/month.   He does > 1000 steps a day.    He does worse in the heat.  Left arm is worse still but right hand seems to be catching up with more weakness.   Left > right legs have spasticity, worse at night. He takes baclofen  10 mg prn and CBD daily which helps a little.  Some left tremors.  Higher dose of baclofen  caused weakness.  He has poor coordination, left > right. Reports secondary progressive type MS   PRECAUTIONS: Fall  WEIGHT BEARING RESTRICTIONS: No  PAIN:  Are you having pain? Yes: NPRS scale: 2-3/10 Pain location: lower back; ache  FALLS: Has patient fallen in last 6 months? Yes. Number of falls 3, requiring fire department to assist  LIVING ENVIRONMENT: Lives with: lives with their family Lives in: House/apartment Stairs: ramp entrance Has following equipment at home: Vannie - 2 wheeled, Environmental consultant - 4 wheeled, Wheelchair (power), shower chair, and Grab bars  PLOF: Independent with basic ADLs, Independent with household mobility with device, and Requires assistive device for independence  PATIENT GOALS: improved posture, functional use of arms and hands  OBJECTIVE:  Note: Objective measures were completed  at Evaluation unless otherwise noted.  HAND DOMINANCE: Right  ADLs: Overall ADLs: I have adapted Transfers/ambulation related to ADLs: uses Rollator for short distances in the home and hand walks into bathroom holding on to counters and railing in bathroom Eating: difficulty with opening containers, scooping foods Grooming: spasticity in arms impacts ease with brushing teeth UB Dressing: Mod I, every once in awhile has some difficulty getting shirt on LB Dressing: Mod I Toileting: uses railing in bathroom and hand walking to get in and around  bathroom Bathing: Mod I Tub Shower transfers: Mod I Equipment: Shower seat with back, Grab bars, and Walk in shower  IADLs: Shopping: mother performs Light housekeeping: mother performs Meal Prep: mother performs Community mobility: mother provides transportation Medication management: Mod I Financial management: Mod I Handwriting: reports he rarely writes anymore  MOBILITY STATUS: Needs Assist: Mod I stand pivot transfers with Rollator, Supervision short distance ambulation with Rollator  POSTURE COMMENTS:  rounded shoulders, forward head, increased thoracic kyphosis, and weight shift left  ACTIVITY TOLERANCE: Activity tolerance: requires a couple days to recover from therapy appts  FUNCTIONAL OUTCOME MEASURES: PSFS   UPPER EXTREMITY ROM:    Active ROM Right eval Left eval  Shoulder flexion 114 130  Shoulder abduction 90 100  Shoulder adduction    Shoulder extension    Shoulder internal rotation    Shoulder external rotation    Elbow flexion [facility] WFL  Elbow extension [facility] WFL  Wrist flexion [facility] WFL  Wrist extension [facility] WFL  Wrist ulnar deviation    Wrist radial deviation    Wrist pronation    Wrist supination    (Blank rows = not tested)  UPPER EXTREMITY MMT:   increased spasticity noted in L with attempts at MMT  MMT Right eval Left eval  Shoulder flexion 3 4-  Shoulder abduction 3+ 4  Shoulder adduction    Shoulder extension    Shoulder internal rotation    Shoulder external rotation    Middle trapezius    Lower trapezius    Elbow flexion 4 4  Elbow extension 4 4  Wrist flexion 3+ 4-  Wrist extension 3+ 4-  Wrist ulnar deviation    Wrist radial deviation    Wrist pronation    Wrist supination    (Blank rows = not tested)  HAND FUNCTION: Grip strength: Right: 40 (40, 36, and 46) lbs; Left: 65 lbs  COORDINATION: 9 Hole Peg test: Right: 2:03 (required 1:35 to place 9 pegs); Left: 2:35 (1:57 to place pegs) Box and Blocks:  Right 25 blocks, Left  18 blocks  SENSATION: Decreased sensation bilaterally, impacting ease with picking up items. I couldn't feel what I was touching.  MUSCLE TONE: increased spasticity LUE > RUE  COGNITION: Overall cognitive status: Within functional limits for tasks assessed  VISION: Subjective report: optic neuritis in the R eye, I have lost vision in the R eye Baseline vision: Wears glasses all the time  TREATMENT DATE:  04/01/24 NMR: OT facilitating upright posture with weight shifting to tap targets.  OT positioned behind to facilitate anterior weight shift and elongation of trunk during reaching.  Initially attempted just weight shifting, however improved with actual targets and intermittent tactile cues at torso for elongation.  OT educating on sitting at counter top and reaching for targets at home to challenge weight shifting and motor control.  Pt demonstrating consistent reach with LUE despite spacticity, however RUE with decreasing height of reach as he fatigued. Engaged in weight shifting while reaching towards lower targets and up to table top to place large jacks into colored cones.  Pt demonstrating improvements in weight shifting and reach into lower ranges bilaterally, still with difficulty with reaching across midline  with LUE due to spasticity. Transitional movements: pt completed stand pivot transfer to Left with UE support on back of chair with close supervision.  Pt requiring increased time and smaller steps during transfer this session.   03/23/24 NMR: engaged in sitting balance and trunk control with reaching across midline, outside BOS, and towards high and low surfaces to challenge weight shifting and trunk control.  Initiated task with large jacks for coordination and placing them into matching colored cones.  Increased challenge with use of resistance  clothespins (2#, 4#)  to place onto vertical dowel.  Pt with increased difficulty with 4# clothespins with RUE, but able to complete with LUE. Pt with good ability to weight shift right/left and forwards/backwards.  OT providing intermittent cues for increased weight shift and upright reach.  Pt with increased difficulty when reaching across midline with RUE, decreased weight shift left and trunk elongation on left.  Pt reports increased fatigue and with 1 posterior LOB when reaching to left. Transitional movements: pt completing stand pivot transfer to Left with UE support on back of chair and close supervision.      03/15/24 Grip strength: attempted hand gripper, however pt unable to sustain grasp, even at 25#, to engage in grip strengthening due to width required to initiate grasp over hand gripper. Large grip pegs: transitioned from hand gripper to large grip pegs with resistance peg board. Pt placing pegs into resistance peg board with RUE and then LUE to challenge grasp, manipulation, and motor control.  Pt demonstrating increased tremor on L > R, as well as LUE knocking over pegs intermittently due to decreased motor control.  However pt with increased spasticity in RUE impacting placement and removal of pegs at further reach. OT educating on rotating peg board as needed to facilitate improved reach and motor control.  OT providing suggestions to carry over this task into home during functional tasks to challenge postural control and weight shifting. Coordination: attempted rotating large grip peg in finger tips on L, however pt unable to complete and demonstrating mild frustration therefore transitioned to stacking blocks.  Pt able to stack 11 blocks on R and 4 blocks on L.  OT educating on stabilizing at elbow or forearm to increase motor control.  Pt still with difficulty with L >R with attempts at coordination tasks.     PATIENT EDUCATION: Education details: educated on functional tasks to  carryover weight shifting and postural control at home Person educated: Patient Education method: Programmer, multimedia, Demonstration, and Verbal cues Education comprehension: verbalized understanding and needs further education  HOME EXERCISE PROGRAM: TBD   GOALS: Goals reviewed with patient? Yes  SHORT TERM GOALS: Target date: 04/09/24  Pt will be independent with gross and fine motor control  HEP. Baseline: new to OPOT Goal status: in progress  2.  Pt will demonstrate improved UE functional use for ADLs as evidenced by increasing box/ blocks score by 4 blocks with BUE Baseline: Right 25 blocks, Left 18 blocks Goal status: in progress  3.  Pt will verbalize understanding of energy conservation techniques.  Baseline: new to OPOT Goal status: in progress  4.  Pt will verbalize understanding of task modifications and/or potential DME / AE needs to increase ease, safety, and independence w/ ADLs. Baseline:  Goal status: in progress  5.  Pt will verbalize understanding of safety concerns and precautions due to impaired sensation in BUE. Baseline: impaired sensation Goal status: in progress    LONG TERM GOALS: Target date: 05/07/24  Pt will demonstrate improved UE functional use for ADLs as evidenced by increasing box/ blocks score by 6 blocks with BUE Baseline: Right 25 blocks, Left 18 blocks Goal status: in progress  2.  Pt will demonstrate improved trunk/postural control with ability to shift in and out of midline and recognize when losing balance during seated or standing ADLs. Baseline: Left lean in unsupported position Goal status: in progress  3.   Pt will demonstrate improved grip strength by 5# on R to increase ease with opening grooming and food containers. Baseline: R: 40# Goal status: in progress  4.  Pt will perform dynamic standing task for 5 mins for simple IADLS w/o LOB using DME and/or countertop support prn Baseline: Right lean in standing Goal status: in  progress  5.  Pt will demonstrate improved shoulder flexion on RUE by 10# to allow for increased reach and strength to remove light-moderate weight items as needed for ADLs and IADLs.  Baseline: RUE 3- to 4 Goal status: in progress  6.  Patient will report at least two-point increase in average PSFS score or at least three-point increase in a single activity score indicating functionally significant improvement given minimum detectable change. Baseline: 4.3 Goal status: in progress  ASSESSMENT:  CLINICAL IMPRESSION: Patient is a 48 y.o. male who was seen today for occupational therapy treatment session for weakness and spasticity due to multiple sclerosis. Pt demonstrating posterior weight shift and rounded shoulders, benefiting from tactile cues and facilitation when reaching.  Pt demonstrating improvements when reaching to L with LUE with target and tactile cues for elongation. Pt continues to demonstrate decreased motor control with LUE with small items.  Pt receptive to suggestions to carryover task to home during functional activities.    PERFORMANCE DEFICITS: in functional skills including ADLs, IADLs, coordination, sensation, ROM, strength, flexibility, Fine motor control, Gross motor control, balance, body mechanics, endurance, decreased knowledge of precautions, decreased knowledge of use of DME, and UE functional use and psychosocial skills including coping strategies, environmental adaptation, and routines and behaviors.    PLAN:  OT FREQUENCY: 1x/week  OT DURATION: 8 weeks  PLANNED INTERVENTIONS: 97168 OT Re-evaluation, 97535 self care/ADL training, 02889 therapeutic exercise, 97530 therapeutic activity, 97112 neuromuscular re-education, 97140 manual therapy, 97113 aquatic therapy, 97035 ultrasound, balance training, functional mobility training, psychosocial skills training, energy conservation, coping strategies training, patient/family education, and DME and/or AE  instructions  RECOMMENDED OTHER SERVICES: NA  CONSULTED AND AGREED WITH PLAN OF CARE: Patient  PLAN FOR NEXT SESSION: initiate coordination and strengthening HEP, may benefit from putty for grip strength, engage in unsupported sitting progressing to standing incorporating weight shifting and reaching.   KAYLENE DOMINO, OTR/L 04/01/2024, 10:49 AM  [facility] [address], [address] [place], [place] [address] Phone # [phone] Fax # [phone]

## 2024-04-01 NOTE — Therapy (Signed)
 OUTPATIENT PHYSICAL THERAPY NEURO TREATMENT   Patient Name: Alexander [name] MRN: [id number] DOB:1975-09-12, 48 y.o., male Today's Date: 04/01/2024   PCP: [name] LABOR, MD REFERRING PROVIDER: Vear Charlie LABOR, MD  END OF SESSION:  PT End of Session - 04/01/24 1056     Visit Number 4    Number of Visits 9    Date for PT Re-Evaluation 05/04/24    Authorization Type BCBS Medicare    PT Start Time 1100    PT Stop Time 1145    PT Time Calculation (min) 45 min    Equipment Utilized During Treatment Gait belt    Activity Tolerance Patient tolerated treatment well    Behavior During Therapy WFL for tasks assessed/performed            Past Medical History:  Diagnosis Date   Hypertension    Multiple sclerosis (HCC) sees Dr. 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   Mid back pain 08/13/2018   Transverse myelitis (HCC) 02/19/2018   History of optic neuritis 02/10/2018   Hand weakness 07/10/2016   Spastic gait 03/07/2016   Multiple sclerosis (HCC) 03/07/2016   Urinary dysfunction 03/07/2016   Tremor 03/07/2016    ONSET DATE: 2005, 2012  REFERRING DIAG: G35 (ICD-10-CM) - Multiple sclerosis (HCC) R25.2 (ICD-10-CM) - Spasticity R26.9 (ICD-10-CM) - Gait disturbance M54.9 (ICD-10-CM) - Mid back pain R26.1 (ICD-10-CM) - Spastic gait R53.1 (ICD-10-CM) - Weakness  THERAPY DIAG:  Unsteadiness on feet  Other symptoms and signs involving the nervous system  Other abnormalities of gait and mobility  Muscle weakness (generalized)  Difficulty in walking, not elsewhere classified  Rationale for Evaluation and Treatment: Rehabilitation  SUBJECTIVE:                                                                                                                                                                                              SUBJECTIVE STATEMENT: Foot is feeling better   Pt accompanied by: self  PERTINENT HISTORY: 48 y.o. man with a relapsing/active form of secondary progressive multiple sclerosis diagnosed in 2003.   Gait and balance are poor.  He uses a Rolator both inside and outside for short distance but a chair for longer distance.  He has a Chief of Staff wheelchair.  With a Rolator, he can  go  > 100 feet.   Has some falls abput 1/month.   He does > 1000 steps a day.    He does worse in the heat.  Left arm is worse still but right hand seems to be catching up with more weakness.   Left > right legs have spasticity, worse at night. He takes baclofen  10 mg prn and CBD daily which helps a little.  Some left tremors.  Higher dose of baclofen  caused weakness.  He has poor coordination, left > right. Reports secondary progressive type MS  PAIN:  Are you having pain? No, reports discomfort in the L foot   PRECAUTIONS: Fall  RED FLAGS: None   WEIGHT BEARING RESTRICTIONS: No  FALLS: Has patient fallen in last 6 months? Yes. Number of falls 3, requiring fire department to assist  LIVING ENVIRONMENT: Lives with: lives with their family Lives in: House/apartment Stairs: ramp entrance Has following equipment at home: Environmental consultant - 2 wheeled, Environmental consultant - 4 wheeled, Wheelchair (power), shower chair, Tour manager, and Grab bars  PLOF: Independent with basic ADLs and Independent with household mobility with device,   PATIENT GOALS: improve posture, strength, stability  OBJECTIVE:   TODAY'S TREATMENT: 04/01/24 Activity Comments  Transfer training SBA stand-pivot Car transfers mod A for LE into vehicle  Weight bearing/single limb stance Foot elevated on 6 step blocking knees for extension 6x30 sec  Dynamic balance -forwards/backwards // bars -sidestep // bars               TODAY'S TREATMENT:  Activity Comments  Seated Pball flexion/ext 2x10 Pball ab setting 10x3 Pball lumbar ext into partial sit up x20 SLR 2x10 Row red TB 2x10 Shoulder extension red TB 2x10 Palloff press red TB 2x10 Ant/post pelvic tilt   Pull to stand in // bars: Scap squeeze 2x10 UE raises for low trap setting x10   W/c to car t/f mod A for LE negotiation into SUV Able to perform stand pivot with SBA but requires assist to lift LEs up            HOME EXERCISE PROGRAM Last updated: 03/15/24 Access Code: [email or web address] URL: [email or web address] Date: 03/15/2024 Prepared by: [facility] - Outpatient  Rehab - [facility]  Exercises - Seated Shoulder Extension and Scapular Retraction with Resistance  - 1 x daily - 5 x weekly - 2 sets - 10 reps - Seated Shoulder Row with Anchored Resistance  - 1 x daily - 5 x weekly - 2 sets - 10 reps - Seated Pelvic Tilts  - 1 x daily - 5 x weekly - 2 sets - 10 reps   PATIENT EDUCATION: Education details: discussed L foot pain, exam findings, HEP Person educated: Patient Education method: Explanation, Demonstration, Tactile cues, Verbal cues, and Handouts Education comprehension: verbalized understanding and returned demonstration     Note: Objective measures were completed at Evaluation unless otherwise noted.  DIAGNOSTIC FINDINGS: MRI 09/09/2023 showed T2/FLAIR hyperintense foci in the cerebral hemispheres, cerebellum and brainstem in a pattern consistent with chronic demyelinating plaque associated with multiple sclerosis. No other foci enhanced or appear to be acute. Compared to the MRI from 08/22/2022, there were no new lesions.  COGNITION: Overall cognitive status: Within functional limits for tasks assessed   SENSATION: Denies any parasthesias  COORDINATION: Grossly limited   EDEMA:  none  MUSCLE TONE: spasticity BLE, sustained clonus on R, 3-4 bear L. Bilat quads, hamstring flaccid  MUSCLE  LENGTH: Hamstrings: Right -10 deg; Left -10 deg   DTRs:  Patella 3+ = Brisk  POSTURE: rounded shoulders, forward head, and increased thoracic kyphosis  LOWER EXTREMITY ROM:     Active  Right Eval Left Eval  Hip flexion 4 0  Hip extension    Hip abduction    Hip adduction    Hip internal rotation    Hip external rotation    Knee flexion 110 110  Knee extension 0 0  Ankle dorsiflexion 0 5  Ankle plantarflexion    Ankle inversion    Ankle eversion     (Blank rows = not tested)  LOWER EXTREMITY MMT:    MMT Right Eval Left Eval  Hip flexion 2- 2-  Hip extension    Hip abduction 2 2  Hip adduction 2 2  Hip internal rotation    Hip external rotation    Knee flexion 2+ 2  Knee extension 4- 3+  Ankle dorsiflexion 0 2  Ankle plantarflexion    Ankle inversion    Ankle eversion    (Blank rows = not tested)  BED MOBILITY:  Reports he uses a rope on the foot board to long sit->sit EOB  TRANSFERS: Sit to stand: Modified independence  Assistive device utilized: Environmental consultant - 4 wheeled     Stand to sit: Modified independence  Assistive device utilized: Environmental consultant - 4 wheeled     Chair to chair: SBA  Assistive device utilized: Environmental consultant - 4 wheeled       RAMP:  Not tested  CURB:  Not tested  STAIRS: Not tested GAIT: Findings: Gait Characteristics: Right foot flat, wide BOS, and poor foot clearance- Left, Distance walked: 25 ft, Level of assistance: SBA and CGA, and Comments:    FUNCTIONAL TESTS:  5 times sit to stand: 31 sec UE use 2 of 5 Timed up and go (TUG): 1 min 49 sec w/ 5TT Romberg: 23 sec eyes open w/ retro-LOB Berg Balance Test: TBD Function In Sitting Test: 45/56                                                                                                                                TREATMENT DATE:     PATIENT EDUCATION: Education details: assessment details Person educated: Patient Education method: Explanation Education comprehension: verbalized  understanding  HOME EXERCISE PROGRAM: TBD  GOALS: Goals reviewed with patient? Yes  SHORT TERM GOALS: Target date: 04/06/2024    Patient will be independent in HEP to improve functional outcomes Baseline: Goal status: IN PROGRESS  2.  Improve unsupported standing x 60 sec to improve ADL participation Baseline: 23 sec w/ retro-LOB Goal status: IN PROGRESS  3.  Teach-back strategies/techniques/activities to address forward head/rounded shoulder posture to improve upright posture for enhanced balance Baseline:  Goal status: IN PROGRESS    LONG TERM GOALS: Target date: 05/04/2024    Demo improved BLE strength and reduced risk for falls per time 20 sec 5xSTS test Baseline: 31 sec w/ UE support 2 of  5 reps Goal status:IN PROGRESS  2.  Demo improved mobility and reduced risk for falls per time 75 sec TUG test Baseline: 1 min 49 sec w/ 4WW Goal status: IN PROGRESS  3.  Berg Balance Test TBD Baseline:  Goal status: IN PROGRESS  4.  Ambulation level surfaces x 150 ft w/ modified independence to improve safety and efficiency with household ambulation Baseline: up to 100 ft w/ SBA and 4WW Goal status: IN PROGRESS  5.  Perform car transfers w/ supervision to reduce level of assistance from caregivers  Baseline: min-mod A  Goal status: IN PROGRESS  ASSESSMENT:  CLINICAL IMPRESSION: Transfer training w/ SBA for safety and sequence SBA with difficulty in foot clearance. Techniques for increased single limb support for balance and tone inhibition w/ assist for foot placement to step. Dynamic balance activities to promote single limb support, weight shifting requiring physical assist 25% of time for lateral weight shift for limb advancement.  Tolerating standing activities 3-5 min before fatigue requiring seated rest periods w/ quick recovery after 30-60 sec. Continued sessions to advance POC details to improve mobility and prevent functional decline.   OBJECTIVE IMPAIRMENTS: Abnormal  gait, decreased activity tolerance, decreased balance, decreased coordination, decreased endurance, decreased mobility, difficulty walking, decreased ROM, decreased strength, impaired flexibility, impaired tone, impaired UE functional use, and postural dysfunction.   ACTIVITY LIMITATIONS: carrying, lifting, bending, sitting, standing, squatting, transfers, bed mobility, reach over head, and locomotion level  PARTICIPATION LIMITATIONS: meal prep, cleaning, laundry, interpersonal relationship, community activity, and exercise participation  PERSONAL FACTORS: Age, Past/current experiences, and Time since onset of injury/illness/exacerbation are also affecting patient's functional outcome.   REHAB POTENTIAL: Excellent  CLINICAL DECISION MAKING: Evolving/moderate complexity  EVALUATION COMPLEXITY: Moderate  PLAN:  PT FREQUENCY: 1x/week  PT DURATION: 8 weeks  PLANNED INTERVENTIONS: 97750- Physical Performance Testing, 97110-Therapeutic exercises, 97530- Therapeutic activity, 97112- Neuromuscular re-education, 97535- Self Care, 02859- Manual therapy, [phone]- Gait training, [phone]- Orthotic Initial, [phone]- Orthotic/Prosthetic subsequent, [phone]- Aquatic Therapy, [phone]- Electrical stimulation (unattended), and 20560 (1-2 muscles), 20561 (3+ muscles)- Dry Needling  PLAN FOR NEXT SESSION: sitting core strengthening, postural re-education/activities (can he get to prone for things like chin/scap retraction?)   Jonette MARLA Sandifer, PT, DPT 04/01/24 10:57 AM  [facility] Re[facility] at [facility] [address] [place], [place] [address] Phone # [phone] Fax # [phone]

## 2024-04-06 NOTE — Therapy (Signed)
 OUTPATIENT PHYSICAL THERAPY NEURO TREATMENT   Patient Name: Alexander [name] MRN: [id number] DOB:Nov 23, 1975, 48 y.o., male Today's Date: 04/07/2024   PCP: [name] LABOR, MD REFERRING PROVIDER: Vear Charlie LABOR, MD  END OF SESSION:  PT End of Session - 04/07/24 1149     Visit Number 5    Number of Visits 9    Date for PT Re-Evaluation 05/04/24    Authorization Type BCBS Medicare    PT Start Time 1101    PT Stop Time 1145    PT Time Calculation (min) 44 min    Equipment Utilized During Treatment Gait belt    Activity Tolerance Patient tolerated treatment well    Behavior During Therapy WFL for tasks assessed/performed             Past Medical History:  Diagnosis Date   Hypertension    Multiple sclerosis (HCC) sees Dr. 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   Mid back pain 08/13/2018   Transverse myelitis (HCC) 02/19/2018   History of optic neuritis 02/10/2018   Hand weakness 07/10/2016   Spastic gait 03/07/2016   Multiple sclerosis (HCC) 03/07/2016   Urinary dysfunction 03/07/2016   Tremor 03/07/2016    ONSET DATE: 2005, 2012  REFERRING DIAG: G35 (ICD-10-CM) - Multiple sclerosis (HCC) R25.2 (ICD-10-CM) - Spasticity R26.9 (ICD-10-CM) - Gait disturbance M54.9 (ICD-10-CM) - Mid back pain R26.1 (ICD-10-CM) - Spastic gait R53.1 (ICD-10-CM) - Weakness  THERAPY DIAG:  Other symptoms and signs involving the nervous system  Muscle weakness (generalized)  Unsteadiness on feet  Difficulty in walking, not elsewhere classified  Rationale for Evaluation and Treatment: Rehabilitation  SUBJECTIVE:                                                                                                                                                                                              SUBJECTIVE STATEMENT: It's going. Reports compliance with HEP.    Pt accompanied by: self  PERTINENT HISTORY: 48 y.o. man with a relapsing/active form of secondary progressive multiple sclerosis diagnosed in 2003.   Gait and balance are poor.  He uses a Rolator both inside and outside for short distance but a chair for longer distance.  He has a Chief of Staff wheelchair.  With a Rolator, he can go  >  100 feet.   Has some falls abput 1/month.   He does > 1000 steps a day.    He does worse in the heat.  Left arm is worse still but right hand seems to be catching up with more weakness.   Left > right legs have spasticity, worse at night. He takes baclofen  10 mg prn and CBD daily which helps a little.  Some left tremors.  Higher dose of baclofen  caused weakness.  He has poor coordination, left > right. Reports secondary progressive type MS  PAIN:  Are you having pain? No  PRECAUTIONS: Fall  RED FLAGS: None   WEIGHT BEARING RESTRICTIONS: No  FALLS: Has patient fallen in last 6 months? Yes. Number of falls 3, requiring fire department to assist  LIVING ENVIRONMENT: Lives with: lives with their family Lives in: House/apartment Stairs: ramp entrance Has following equipment at home: Vannie - 2 wheeled, Environmental consultant - 4 wheeled, Wheelchair (power), shower chair, Tour manager, and Grab bars  PLOF: Independent with basic ADLs and Independent with household mobility with device,   PATIENT GOALS: improve posture, strength, stability  OBJECTIVE:     TODAY'S TREATMENT: 04/07/24 Activity Comments  Stand pivot transfer w/c<>nustep  Improved ability to perform sufficient step length compared to previous session. PT assists with transfer set up and CGA  nustep L1 x 6 min UEs/LEs  For LE stretching, endurance   in II bars: standing alt UE raise 10x  standing trunk rotation with and without  ball standing glute sets 5x5 standing scapular retraction 5X5 Required min A to prevent posterior LOB with trunk rotation   Standing unsupported in II bars 16 sec, 25 sec with forward LOB  Car transfer with RW Min A provided for set up and LE management           PATIENT EDUCATION: Education details: edu on improving postural awareness while on the computer to address c/o burning pain b/w shoulder blades while on the computer, edu on benefits of AFO and leg lifter for transfers  Person educated: Patient Education method: Explanation Education comprehension: verbalized understanding    HOME EXERCISE PROGRAM Last updated: 03/15/24 Access Code: [email or web address] URL: [email or web address] Date: 03/15/2024 Prepared by: [facility] - Outpatient  Rehab - [facility]  Exercises - Seated Shoulder Extension and Scapular Retraction with Resistance  - 1 x daily - 5 x weekly - 2 sets - 10 reps - Seated Shoulder Row with Anchored Resistance  - 1 x daily - 5 x weekly - 2 sets - 10 reps - Seated Pelvic Tilts  - 1 x daily - 5 x weekly - 2 sets - 10 reps      Note: Objective measures were completed at Evaluation unless otherwise noted.  DIAGNOSTIC FINDINGS: MRI 09/09/2023 showed T2/FLAIR hyperintense foci in the cerebral hemispheres, cerebellum and brainstem in a pattern consistent with chronic demyelinating plaque associated with multiple sclerosis. No other foci enhanced or appear to be acute. Compared to the MRI from 08/22/2022, there were no new lesions.  COGNITION: Overall cognitive status: Within functional limits for tasks assessed   SENSATION: Denies any parasthesias  COORDINATION: Grossly limited   EDEMA:  none  MUSCLE TONE: spasticity BLE, sustained clonus on R, 3-4 bear L. Bilat quads, hamstring flaccid  MUSCLE LENGTH: Hamstrings: Right -10 deg; Left -10 deg   DTRs:  Patella 3+ = Brisk  POSTURE: rounded shoulders, forward head, and increased thoracic  kyphosis  LOWER EXTREMITY ROM:     Active  Right Eval Left  Eval  Hip flexion 4 0  Hip extension    Hip abduction    Hip adduction    Hip internal rotation    Hip external rotation    Knee flexion 110 110  Knee extension 0 0  Ankle dorsiflexion 0 5  Ankle plantarflexion    Ankle inversion    Ankle eversion     (Blank rows = not tested)  LOWER EXTREMITY MMT:    MMT Right Eval Left Eval  Hip flexion 2- 2-  Hip extension    Hip abduction 2 2  Hip adduction 2 2  Hip internal rotation    Hip external rotation    Knee flexion 2+ 2  Knee extension 4- 3+  Ankle dorsiflexion 0 2  Ankle plantarflexion    Ankle inversion    Ankle eversion    (Blank rows = not tested)  BED MOBILITY:  Reports he uses a rope on the foot board to long sit->sit EOB  TRANSFERS: Sit to stand: Modified independence  Assistive device utilized: Environmental consultant - 4 wheeled     Stand to sit: Modified independence  Assistive device utilized: Environmental consultant - 4 wheeled     Chair to chair: SBA  Assistive device utilized: Environmental consultant - 4 wheeled       RAMP:  Not tested  CURB:  Not tested  STAIRS: Not tested GAIT: Findings: Gait Characteristics: Right foot flat, wide BOS, and poor foot clearance- Left, Distance walked: 25 ft, Level of assistance: SBA and CGA, and Comments:    FUNCTIONAL TESTS:  5 times sit to stand: 31 sec UE use 2 of 5 Timed up and go (TUG): 1 min 49 sec w/ 5TT Romberg: 23 sec eyes open w/ retro-LOB Berg Balance Test: TBD Function In Sitting Test: 45/56                                                                                                                                TREATMENT DATE:     PATIENT EDUCATION: Education details: assessment details Person educated: Patient Education method: Explanation Education comprehension: verbalized understanding  HOME EXERCISE PROGRAM: TBD  GOALS: Goals reviewed with patient? Yes  SHORT TERM GOALS: Target date: 04/06/2024    Patient will  be independent in HEP to improve functional outcomes Baseline: Goal status: MET 04/07/24  2.  Improve unsupported standing x 60 sec to improve ADL participation Baseline: 23 sec w/ retro-LOB;  25 sec with forward LOB 04/07/24  Goal status: NOT MET S 04/07/24   3.  Teach-back strategies/techniques/activities to address forward head/rounded shoulder posture to improve upright posture for enhanced balance Baseline: edu provided today 04/07/24  Goal status: NOT MET 04/07/24     LONG TERM GOALS: Target date: 05/04/2024    Demo improved BLE strength and reduced risk for falls per time 20 sec 5xSTS test Baseline: 31 sec w/ UE support 2 of 5 reps Goal status:IN PROGRESS  2.  Demo improved  mobility and reduced risk for falls per time 75 sec TUG test Baseline: 1 min 49 sec w/ 4WW Goal status: IN PROGRESS  3.  Berg Balance Test TBD Baseline:  Goal status: IN PROGRESS  4.  Ambulation level surfaces x 150 ft w/ modified independence to improve safety and efficiency with household ambulation Baseline: up to 100 ft w/ SBA and 4WW Goal status: IN PROGRESS  5.  Perform car transfers w/ supervision to reduce level of assistance from caregivers  Baseline: min-mod A  Goal status: IN PROGRESS  ASSESSMENT:  CLINICAL IMPRESSION: Patient arrived to session without complaints. STGs were assessed today- patient reported compliance with HEP. Unsupported standing was limited to 25 seconds today, not quite meeting this goal. Education was provided to improve awareness of posture during computer tasks as pt reports burning pain between scapulae after being on the computer for a while. Worked on standing balance activities, providing cueing for postural and core muscle recruitment to assist in stability. Car transfer performed with assistance at end of transfer and recommended purchasing leg lifters to assist with this task. No complaints at end of session.   OBJECTIVE IMPAIRMENTS: Abnormal gait, decreased activity  tolerance, decreased balance, decreased coordination, decreased endurance, decreased mobility, difficulty walking, decreased ROM, decreased strength, impaired flexibility, impaired tone, impaired UE functional use, and postural dysfunction.   ACTIVITY LIMITATIONS: carrying, lifting, bending, sitting, standing, squatting, transfers, bed mobility, reach over head, and locomotion level  PARTICIPATION LIMITATIONS: meal prep, cleaning, laundry, interpersonal relationship, community activity, and exercise participation  PERSONAL FACTORS: Age, Past/current experiences, and Time since onset of injury/illness/exacerbation are also affecting patient's functional outcome.   REHAB POTENTIAL: Excellent  CLINICAL DECISION MAKING: Evolving/moderate complexity  EVALUATION COMPLEXITY: Moderate  PLAN:  PT FREQUENCY: 1x/week  PT DURATION: 8 weeks  PLANNED INTERVENTIONS: 97750- Physical Performance Testing, 97110-Therapeutic exercises, 97530- Therapeutic activity, [id number]- Neuromuscular re-education, 97535- Self Care, 02859- Manual therapy, [id number]- Gait training, [phone]- Orthotic Initial, [phone]- Orthotic/Prosthetic subsequent, [phone]- Aquatic Therapy, [phone]- Electrical stimulation (unattended), and 20560 (1-2 muscles), 20561 (3+ muscles)- Dry Needling  PLAN FOR NEXT SESSION: leg/thigh lifters for car transfers? sitting core strengthening, postural re-education/activities (can he get to prone for things like chin/scap retraction?)  Louana Terrilyn Christians, PT, DPT 04/07/24 11:53 AM  [facility] Rehab at [facility] [address] [place], [address] [place], [place] [address] Phone # [phone] Fax # [phone]

## 2024-04-07 ENCOUNTER — Encounter: Payer: Self-pay | Admitting: Physical Therapy

## 2024-04-07 ENCOUNTER — Ambulatory Visit: Admitting: Physical Therapy

## 2024-04-07 ENCOUNTER — Ambulatory Visit: Attending: Neurology | Admitting: Occupational Therapy

## 2024-04-07 DIAGNOSIS — R2689 Other abnormalities of gait and mobility: Secondary | ICD-10-CM | POA: Diagnosis not present

## 2024-04-07 DIAGNOSIS — R262 Difficulty in walking, not elsewhere classified: Secondary | ICD-10-CM | POA: Diagnosis not present

## 2024-04-07 DIAGNOSIS — R2681 Unsteadiness on feet: Secondary | ICD-10-CM | POA: Insufficient documentation

## 2024-04-07 DIAGNOSIS — R278 Other lack of coordination: Secondary | ICD-10-CM | POA: Insufficient documentation

## 2024-04-07 DIAGNOSIS — M6281 Muscle weakness (generalized): Secondary | ICD-10-CM | POA: Insufficient documentation

## 2024-04-07 DIAGNOSIS — R29818 Other symptoms and signs involving the nervous system: Secondary | ICD-10-CM | POA: Diagnosis not present

## 2024-04-07 NOTE — Therapy (Signed)
 OUTPATIENT OCCUPATIONAL THERAPY NEURO  Treatment Note  Patient Name: Alexander [name] MRN: [id number] DOB:1976-03-13, 48 y.o., male Today's Date: 04/07/2024  PCP: [name] LABOR, MD REFERRING PROVIDER: Vear Charlie LABOR, MD  END OF SESSION:  OT End of Session - 04/07/24 1022     Visit Number 5    Number of Visits 9    Date for OT Re-Evaluation 05/07/24    Authorization Type [employer] Medicare 2025    OT Start Time 1018    OT Stop Time 1100    OT Time Calculation (min) 42 min              Past Medical History:  Diagnosis Date   Hypertension    Multiple sclerosis (HCC) sees Dr. 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   Mid back pain 08/13/2018   Transverse myelitis (HCC) 02/19/2018   History of optic neuritis 02/10/2018   Hand weakness 07/10/2016   Spastic gait 03/07/2016   Multiple sclerosis (HCC) 03/07/2016   Urinary dysfunction 03/07/2016   Tremor 03/07/2016    ONSET DATE: referral date 11/04/23  REFERRING DIAG: G35 (ICD-10-CM) - Multiple sclerosis (HCC) R25.2 (ICD-10-CM) - Spasticity R26.9 (ICD-10-CM) - Gait disturbance M54.9 (ICD-10-CM) - Mid back pain R26.1 (ICD-10-CM) - Spastic gait R53.1 (ICD-10-CM) - Weakness  THERAPY DIAG:  Other symptoms and signs involving the nervous system  Muscle weakness (generalized)  Rationale for Evaluation and Treatment: Rehabilitation  SUBJECTIVE:   SUBJECTIVE STATEMENT: Pt reports that he has had an uneventful week which we like.   Pt accompanied by: family member (mother dropped him off)  PERTINENT HISTORY: 48 y.o. man with a relapsing/active form of secondary progressive multiple sclerosis diagnosed in 2003.   Gait and balance are poor.   He uses a Rolator both inside and outside for short distance but a chair for longer distance.  He has a Chief of Staff wheelchair.  With a Rolator, he can go  > 100 feet.   Has some falls abput 1/month.   He does > 1000 steps a day.    He does worse in the heat.  Left arm is worse still but right hand seems to be catching up with more weakness.   Left > right legs have spasticity, worse at night. He takes baclofen  10 mg prn and CBD daily which helps a little.  Some left tremors.  Higher dose of baclofen  caused weakness.  He has poor coordination, left > right. Reports secondary progressive type MS   PRECAUTIONS: Fall  WEIGHT BEARING RESTRICTIONS: No  PAIN:  Are you having pain? Yes: NPRS scale: 2-3/10 Pain location: lower back; ache  FALLS: Has patient fallen in last 6 months? Yes. Number of falls 3, requiring fire department to assist  LIVING ENVIRONMENT: Lives with: lives with their family Lives in: House/apartment Stairs: ramp entrance Has following equipment at home: Vannie - 2 wheeled, Environmental consultant - 4 wheeled, Wheelchair (power), shower chair, and Grab bars  PLOF: Independent with basic ADLs, Independent with household mobility with device, and Requires assistive device for independence  PATIENT GOALS: improved posture, functional use of arms and hands  OBJECTIVE:  Note: Objective measures were completed at Evaluation unless otherwise  noted.  HAND DOMINANCE: Right  ADLs: Overall ADLs: I have adapted Transfers/ambulation related to ADLs: uses Rollator for short distances in the home and hand walks into bathroom holding on to counters and railing in bathroom Eating: difficulty with opening containers, scooping foods Grooming: spasticity in arms impacts ease with brushing teeth UB Dressing: Mod I, every once in awhile has some difficulty getting shirt on LB Dressing: Mod I Toileting: uses railing in bathroom and hand walking to get in and around bathroom Bathing: Mod  I Tub Shower transfers: Mod I Equipment: Shower seat with back, Grab bars, and Walk in shower  IADLs: Shopping: mother performs Light housekeeping: mother performs Meal Prep: mother performs Community mobility: mother provides transportation Medication management: Mod I Financial management: Mod I Handwriting: reports he rarely writes anymore  MOBILITY STATUS: Needs Assist: Mod I stand pivot transfers with Rollator, Supervision short distance ambulation with Rollator  POSTURE COMMENTS:  rounded shoulders, forward head, increased thoracic kyphosis, and weight shift left  ACTIVITY TOLERANCE: Activity tolerance: requires a couple days to recover from therapy appts  FUNCTIONAL OUTCOME MEASURES: PSFS   UPPER EXTREMITY ROM:    Active ROM Right eval Left eval  Shoulder flexion 114 130  Shoulder abduction 90 100  Shoulder adduction    Shoulder extension    Shoulder internal rotation    Shoulder external rotation    Elbow flexion [facility] WFL  Elbow extension [facility] WFL  Wrist flexion [facility] WFL  Wrist extension [facility] WFL  Wrist ulnar deviation    Wrist radial deviation    Wrist pronation    Wrist supination    (Blank rows = not tested)  UPPER EXTREMITY MMT:   increased spasticity noted in L with attempts at MMT  MMT Right eval Left eval  Shoulder flexion 3 4-  Shoulder abduction 3+ 4  Shoulder adduction    Shoulder extension    Shoulder internal rotation    Shoulder external rotation    Middle trapezius    Lower trapezius    Elbow flexion 4 4  Elbow extension 4 4  Wrist flexion 3+ 4-  Wrist extension 3+ 4-  Wrist ulnar deviation    Wrist radial deviation    Wrist pronation    Wrist supination    (Blank rows = not tested)  HAND FUNCTION: Grip strength: Right: 40 (40, 36, and 46) lbs; Left: 65 lbs  COORDINATION: 9 Hole Peg test: Right: 2:03 (required 1:35 to place 9 pegs); Left: 2:35 (1:57 to place pegs) Box and Blocks:  Right 25 blocks, Left 18  blocks  SENSATION: Decreased sensation bilaterally, impacting ease with picking up items. I couldn't feel what I was touching.  MUSCLE TONE: increased spasticity LUE > RUE  COGNITION: Overall cognitive status: Within functional limits for tasks assessed  VISION: Subjective report: optic neuritis in the R eye, I have lost vision in the R eye Baseline vision: Wears glasses all the time  TREATMENT DATE:  04/07/24 Transitional movements: pt completed stand pivot transfer to Left with UE support on back of chair with close supervision.  Pt requiring increased time and smaller steps during transfer this session.  Transitioned to supine on mat table with increased time to gather RLE and advance onto mat table.  OT providing physical assistance with moving RLE over onto mat while pt brought LLE to mat table.  OT providing physical assistance to readjust BLE while pt scooting over on mat table.  Pt positioned semi-reclined with wedge and pillows under upper back and head for improved positioning.  Pt requiring max assist for semi-reclined to sitting in circle sitting on mat table after exercises.  Pt then scooting to R with mod assist to shift hips and then scooting forward with min assist with therapist assist to lower LLE to floor.   Shoulder ROM: engaged in open book stretch and shoulder flexion in semi-reclined position with sustained hold at end range with each exercise.  OT providing min cues to focus on quality of movement with LUE and stretch bilaterally.  Once sitting upright, engaged in posterior lean with therapist providing support along spine to facilitate scapular retraction.   NMR: engaged in weight shifting to tap targets to R and L in sitting.  OT providing facilitation at hips and upper back to facilitate trunk elongation during weight shifting.      04/01/24 NMR: OT facilitating upright posture with weight shifting to tap targets.  OT positioned behind to facilitate anterior weight shift and elongation of trunk during reaching.  Initially attempted just weight shifting, however improved with actual targets and intermittent tactile cues at torso for elongation.  OT educating on sitting at counter top and reaching for targets at home to challenge weight shifting and motor control.  Pt demonstrating consistent reach with LUE despite spacticity, however RUE with decreasing height of reach as he fatigued. Engaged in weight shifting while reaching towards lower targets and up to table top to place large jacks into colored cones.  Pt demonstrating improvements in weight shifting and reach into lower ranges bilaterally, still with difficulty with reaching across midline  with LUE due to spasticity. Transitional movements: pt completed stand pivot transfer to Left with UE support on back of chair with close supervision.  Pt requiring increased time and smaller steps during transfer this session.   03/23/24 NMR: engaged in sitting balance and trunk control with reaching across midline, outside BOS, and towards high and low surfaces to challenge weight shifting and trunk control.  Initiated task with large jacks for coordination and placing them into matching colored cones.  Increased challenge with use of resistance clothespins (2#, 4#)  to place onto vertical dowel.  Pt with increased difficulty with 4# clothespins with RUE, but able to complete with LUE. Pt with good ability to weight shift right/left and forwards/backwards.  OT providing intermittent cues for increased weight shift and upright reach.  Pt with increased difficulty when reaching across midline with RUE, decreased weight shift left and trunk elongation on left.  Pt reports increased fatigue and with 1 posterior LOB when reaching to left. Transitional movements: pt completing stand pivot  transfer to Left with UE support on back of chair and close supervision.     PATIENT EDUCATION: Education details: educated on functional tasks to carryover weight shifting and postural control at home Person educated: Patient Education method: Explanation, Demonstration, and Verbal cues Education comprehension: verbalized understanding and needs further education  HOME EXERCISE  PROGRAM: TBD   GOALS: Goals reviewed with patient? Yes  SHORT TERM GOALS: Target date: 04/09/24  Pt will be independent with gross and fine motor control HEP. Baseline: new to OPOT Goal status: in progress  2.  Pt will demonstrate improved UE functional use for ADLs as evidenced by increasing box/ blocks score by 4 blocks with BUE Baseline: Right 25 blocks, Left 18 blocks Goal status: in progress  3.  Pt will verbalize understanding of energy conservation techniques.  Baseline: new to OPOT Goal status: in progress  4.  Pt will verbalize understanding of task modifications and/or potential DME / AE needs to increase ease, safety, and independence w/ ADLs. Baseline:  Goal status: in progress  5.  Pt will verbalize understanding of safety concerns and precautions due to impaired sensation in BUE. Baseline: impaired sensation Goal status: in progress    LONG TERM GOALS: Target date: 05/07/24  Pt will demonstrate improved UE functional use for ADLs as evidenced by increasing box/ blocks score by 6 blocks with BUE Baseline: Right 25 blocks, Left 18 blocks Goal status: in progress  2.  Pt will demonstrate improved trunk/postural control with ability to shift in and out of midline and recognize when losing balance during seated or standing ADLs. Baseline: Left lean in unsupported position Goal status: in progress  3.   Pt will demonstrate improved grip strength by 5# on R to increase ease with opening grooming and food containers. Baseline: R: 40# Goal status: in progress  4.  Pt will perform  dynamic standing task for 5 mins for simple IADLS w/o LOB using DME and/or countertop support prn Baseline: Right lean in standing Goal status: in progress  5.  Pt will demonstrate improved shoulder flexion on RUE by 10# to allow for increased reach and strength to remove light-moderate weight items as needed for ADLs and IADLs.  Baseline: RUE 3- to 4 Goal status: in progress  6.  Patient will report at least two-point increase in average PSFS score or at least three-point increase in a single activity score indicating functionally significant improvement given minimum detectable change. Baseline: 4.3 Goal status: in progress  ASSESSMENT:  CLINICAL IMPRESSION: Patient is a 48 y.o. male who was seen today for occupational therapy treatment session for weakness and spasticity due to multiple sclerosis. Pt demonstrating posterior weight shift and rounded shoulders, benefiting from tactile cues and facilitation when reaching.  Pt requiring mod-max assist for supine <> sit due to tone.  Once upright, pt with good sequencing and weight shifting to scoot around towards edge of mat.  Pt requiring increased time and assistance during transitional movements into and out of supine.   PERFORMANCE DEFICITS: in functional skills including ADLs, IADLs, coordination, sensation, ROM, strength, flexibility, Fine motor control, Gross motor control, balance, body mechanics, endurance, decreased knowledge of precautions, decreased knowledge of use of DME, and UE functional use and psychosocial skills including coping strategies, environmental adaptation, and routines and behaviors.    PLAN:  OT FREQUENCY: 1x/week  OT DURATION: 8 weeks  PLANNED INTERVENTIONS: 97168 OT Re-evaluation, 97535 self care/ADL training, 02889 therapeutic exercise, 97530 therapeutic activity, 97112 neuromuscular re-education, 97140 manual therapy, 97113 aquatic therapy, 97035 ultrasound, balance training, functional mobility training,  psychosocial skills training, energy conservation, coping strategies training, patient/family education, and DME and/or AE instructions  RECOMMENDED OTHER SERVICES: NA  CONSULTED AND AGREED WITH PLAN OF CARE: Patient  PLAN FOR NEXT SESSION: initiate coordination and strengthening HEP, may benefit from putty for grip strength,  engage in unsupported sitting progressing to standing incorporating weight shifting and reaching, ? LUE WB in standing at elevated mat table vs in quadruped with min A   Stormee Duda, OTR/L 04/07/2024, 10:30 AM  [facility] [address] [place], [address] [place], [place] [address] Phone # [phone] Fax # [phone]

## 2024-04-14 ENCOUNTER — Ambulatory Visit: Admitting: Physical Therapy

## 2024-04-14 ENCOUNTER — Encounter: Payer: Self-pay | Admitting: Physical Therapy

## 2024-04-14 ENCOUNTER — Ambulatory Visit: Admitting: Occupational Therapy

## 2024-04-14 DIAGNOSIS — M6281 Muscle weakness (generalized): Secondary | ICD-10-CM

## 2024-04-14 DIAGNOSIS — R262 Difficulty in walking, not elsewhere classified: Secondary | ICD-10-CM | POA: Diagnosis not present

## 2024-04-14 DIAGNOSIS — R29818 Other symptoms and signs involving the nervous system: Secondary | ICD-10-CM | POA: Diagnosis not present

## 2024-04-14 DIAGNOSIS — R2689 Other abnormalities of gait and mobility: Secondary | ICD-10-CM | POA: Diagnosis not present

## 2024-04-14 DIAGNOSIS — R2681 Unsteadiness on feet: Secondary | ICD-10-CM

## 2024-04-14 DIAGNOSIS — R278 Other lack of coordination: Secondary | ICD-10-CM | POA: Diagnosis not present

## 2024-04-14 NOTE — Therapy (Signed)
 OUTPATIENT OCCUPATIONAL THERAPY NEURO  Treatment Note  Patient Name: Alexander [name] MRN: [id number] DOB:21-Jan-1976, 48 y.o., male Today's Date: 04/14/2024  PCP: [name] LABOR, MD REFERRING PROVIDER: Vear Charlie LABOR, MD  END OF SESSION:  OT End of Session - 04/14/24 1119     Visit Number 6    Number of Visits 9    Date for OT Re-Evaluation 05/07/24    Authorization Type [employer] Medicare 2025    OT Start Time 1019    OT Stop Time 1102    OT Time Calculation (min) 43 min               Past Medical History:  Diagnosis Date   Hypertension    Multiple sclerosis (HCC) sees Dr. 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   Mid back pain 08/13/2018   Transverse myelitis (HCC) 02/19/2018   History of optic neuritis 02/10/2018   Hand weakness 07/10/2016   Spastic gait 03/07/2016   Multiple sclerosis (HCC) 03/07/2016   Urinary dysfunction 03/07/2016   Tremor 03/07/2016    ONSET DATE: referral date 11/04/23  REFERRING DIAG: G35 (ICD-10-CM) - Multiple sclerosis (HCC) R25.2 (ICD-10-CM) - Spasticity R26.9 (ICD-10-CM) - Gait disturbance M54.9 (ICD-10-CM) - Mid back pain R26.1 (ICD-10-CM) - Spastic gait R53.1 (ICD-10-CM) - Weakness  THERAPY DIAG:  Muscle weakness (generalized)  Other symptoms and signs involving the nervous system  Other abnormalities of gait and mobility  Other lack of coordination  Rationale for Evaluation and Treatment: Rehabilitation  SUBJECTIVE:   SUBJECTIVE STATEMENT: Pt reports that his hands have been feeling really weak this week.  Pt accompanied by: family member (mother dropped him off)  PERTINENT HISTORY: 48 y.o. man with a relapsing/active form of secondary  progressive multiple sclerosis diagnosed in 2003.   Gait and balance are poor.  He uses a Rolator both inside and outside for short distance but a chair for longer distance.  He has a Chief of Staff wheelchair.  With a Rolator, he can go  > 100 feet.   Has some falls abput 1/month.   He does > 1000 steps a day.    He does worse in the heat.  Left arm is worse still but right hand seems to be catching up with more weakness.   Left > right legs have spasticity, worse at night. He takes baclofen  10 mg prn and CBD daily which helps a little.  Some left tremors.  Higher dose of baclofen  caused weakness.  He has poor coordination, left > right. Reports secondary progressive type MS   PRECAUTIONS: Fall  WEIGHT BEARING RESTRICTIONS: No  PAIN:  Are you having pain? No  FALLS: Has patient fallen in last 6 months? Yes. Number of falls 3, requiring fire department to assist  LIVING ENVIRONMENT: Lives with: lives with their family Lives in: House/apartment Stairs: ramp entrance Has following equipment at home: Vannie - 2 wheeled, Environmental consultant - 4 wheeled, Wheelchair (power), shower chair, and Grab bars  PLOF: Independent with basic ADLs, Independent with household mobility with device, and Requires assistive device for independence  PATIENT GOALS: improved posture, functional use of arms and hands  OBJECTIVE:  Note: Objective measures were completed  at Evaluation unless otherwise noted.  HAND DOMINANCE: Right  ADLs: Overall ADLs: I have adapted Transfers/ambulation related to ADLs: uses Rollator for short distances in the home and hand walks into bathroom holding on to counters and railing in bathroom Eating: difficulty with opening containers, scooping foods Grooming: spasticity in arms impacts ease with brushing teeth UB Dressing: Mod I, every once in awhile has some difficulty getting shirt on LB Dressing: Mod I Toileting: uses railing in bathroom and hand walking to get in and around  bathroom Bathing: Mod I Tub Shower transfers: Mod I Equipment: Shower seat with back, Grab bars, and Walk in shower  IADLs: Shopping: mother performs Light housekeeping: mother performs Meal Prep: mother performs Community mobility: mother provides transportation Medication management: Mod I Financial management: Mod I Handwriting: reports he rarely writes anymore  MOBILITY STATUS: Needs Assist: Mod I stand pivot transfers with Rollator, Supervision short distance ambulation with Rollator  POSTURE COMMENTS:  rounded shoulders, forward head, increased thoracic kyphosis, and weight shift left  ACTIVITY TOLERANCE: Activity tolerance: requires a couple days to recover from therapy appts  FUNCTIONAL OUTCOME MEASURES: PSFS   UPPER EXTREMITY ROM:    Active ROM Right eval Left eval  Shoulder flexion 114 130  Shoulder abduction 90 100  Shoulder adduction    Shoulder extension    Shoulder internal rotation    Shoulder external rotation    Elbow flexion [facility] WFL  Elbow extension [facility] WFL  Wrist flexion [facility] WFL  Wrist extension [facility] WFL  Wrist ulnar deviation    Wrist radial deviation    Wrist pronation    Wrist supination    (Blank rows = not tested)  UPPER EXTREMITY MMT:   increased spasticity noted in L with attempts at MMT  MMT Right eval Left eval  Shoulder flexion 3 4-  Shoulder abduction 3+ 4  Shoulder adduction    Shoulder extension    Shoulder internal rotation    Shoulder external rotation    Middle trapezius    Lower trapezius    Elbow flexion 4 4  Elbow extension 4 4  Wrist flexion 3+ 4-  Wrist extension 3+ 4-  Wrist ulnar deviation    Wrist radial deviation    Wrist pronation    Wrist supination    (Blank rows = not tested)  HAND FUNCTION: Grip strength: Right: 40 (40, 36, and 46) lbs; Left: 65 lbs  COORDINATION: 9 Hole Peg test: Right: 2:03 (required 1:35 to place 9 pegs); Left: 2:35 (1:57 to place pegs) Box and Blocks:  Right 25 blocks, Left  18 blocks  SENSATION: Decreased sensation bilaterally, impacting ease with picking up items. I couldn't feel what I was touching.  MUSCLE TONE: increased spasticity LUE > RUE  COGNITION: Overall cognitive status: Within functional limits for tasks assessed  VISION: Subjective report: optic neuritis in the R eye, I have lost vision in the R eye Baseline vision: Wears glasses all the time  TREATMENT DATE:  04/14/24 Gross motor control: engaged in picking up small rings with good lateral pinch with RUE and moving them to place onto vertical targets.  OT increasing distance of targets to facilitate increased ROM and reach.  OT providing instruction to WB through LUE during reaching for added NMR of WB as well as improved upright posture and trunk control.  Transitioned to reaching with LUE.  Pt demonstrating good reach, however demonstrating tremors due to spasticity.  Engaged in reaching for cones incorporating increased weight shifting with reaching further outside BOS and across midline.  Pt again WB through LUE while reaching with RUE and then transitioned to reaching with LUE.  Pt initially knocking over cones from lower surfaces on L with LUE, however with cues to focus on weight shifting and hand opening, pt with increased motor control.  OT providing intermittent tactile cue at torso to facilitate elongation and upright posture. Box and blocks: Right: 27 blocks, Left: 18 blocks Pt asking questions about methods to decrease tremors.  Pt educating on WB, focus on proximal and core strength, and potential benefit of weight or compression, and/or medication.  Encouraged pt to f/u with MD if desire to pursue anything medically.    04/07/24 Transitional movements: pt completed stand pivot transfer to Left with UE support on back of chair with close supervision.  Pt  requiring increased time and smaller steps during transfer this session.  Transitioned to supine on mat table with increased time to gather RLE and advance onto mat table.  OT providing physical assistance with moving RLE over onto mat while pt brought LLE to mat table.  OT providing physical assistance to readjust BLE while pt scooting over on mat table.  Pt positioned semi-reclined with wedge and pillows under upper back and head for improved positioning.  Pt requiring max assist for semi-reclined to sitting in circle sitting on mat table after exercises.  Pt then scooting to R with mod assist to shift hips and then scooting forward with min assist with therapist assist to lower LLE to floor.   Shoulder ROM: engaged in open book stretch and shoulder flexion in semi-reclined position with sustained hold at end range with each exercise.  OT providing min cues to focus on quality of movement with LUE and stretch bilaterally.  Once sitting upright, engaged in posterior lean with therapist providing support along spine to facilitate scapular retraction.   NMR: engaged in weight shifting to tap targets to R and L in sitting.  OT providing facilitation at hips and upper back to facilitate trunk elongation during weight shifting.     04/01/24 NMR: OT facilitating upright posture with weight shifting to tap targets.  OT positioned behind to facilitate anterior weight shift and elongation of trunk during reaching.  Initially attempted just weight shifting, however improved with actual targets and intermittent tactile cues at torso for elongation.  OT educating on sitting at counter top and reaching for targets at home to challenge weight shifting and motor control.  Pt demonstrating consistent reach with LUE despite spacticity, however RUE with decreasing height of reach as he fatigued. Engaged in weight shifting while reaching towards lower targets and up to table top to place large jacks into colored cones.  Pt  demonstrating improvements in weight shifting and reach into lower ranges bilaterally, still with difficulty with reaching across midline  with LUE due to spasticity. Transitional movements: pt completed stand pivot transfer to Left with UE support on back of chair with  close supervision.  Pt requiring increased time and smaller steps during transfer this session.  PATIENT EDUCATION: Education details: educated on functional tasks to carryover weight shifting and postural control at home Person educated: Patient Education method: Programmer, multimedia, Demonstration, and Verbal cues Education comprehension: verbalized understanding and needs further education  HOME EXERCISE PROGRAM: TBD   GOALS: Goals reviewed with patient? Yes  SHORT TERM GOALS: Target date: 04/09/24  Pt will be independent with gross and fine motor control HEP. Baseline: new to OPOT Goal status: in progress  2.  Pt will demonstrate improved UE functional use for ADLs as evidenced by increasing box/ blocks score by 4 blocks with BUE Baseline: Right 25 blocks, Left 18 blocks 04/14/24: R: 27 blocks, Left: 18 blocks Goal status: in progress  3.  Pt will verbalize understanding of energy conservation techniques.  Baseline: new to OPOT Goal status: in progress  4.  Pt will verbalize understanding of task modifications and/or potential DME / AE needs to increase ease, safety, and independence w/ ADLs. Baseline:  Goal status: in progress  5.  Pt will verbalize understanding of safety concerns and precautions due to impaired sensation in BUE. Baseline: impaired sensation Goal status: in progress    LONG TERM GOALS: Target date: 05/07/24  Pt will demonstrate improved UE functional use for ADLs as evidenced by increasing box/ blocks score by 6 blocks with BUE Baseline: Right 25 blocks, Left 18 blocks Goal status: in progress  2.  Pt will demonstrate improved trunk/postural control with ability to shift in and out of midline  and recognize when losing balance during seated or standing ADLs. Baseline: Left lean in unsupported position Goal status: in progress  3.   Pt will demonstrate improved grip strength by 5# on R to increase ease with opening grooming and food containers. Baseline: R: 40# Goal status: in progress  4.  Pt will perform dynamic standing task for 5 mins for simple IADLS w/o LOB using DME and/or countertop support prn Baseline: Right lean in standing Goal status: in progress  5.  Pt will demonstrate improved shoulder flexion on RUE by 10# to allow for increased reach and strength to remove light-moderate weight items as needed for ADLs and IADLs.  Baseline: RUE 3- to 4 Goal status: in progress  6.  Patient will report at least two-point increase in average PSFS score or at least three-point increase in a single activity score indicating functionally significant improvement given minimum detectable change. Baseline: 4.3 Goal status: in progress  ASSESSMENT:  CLINICAL IMPRESSION: Patient is a 48 y.o. male who was seen today for occupational therapy treatment session for weakness and spasticity due to multiple sclerosis. Pt demonstrating ongoing posterior weight shift and rounded shoulders, frequently placing LUE into lap further impacting forward, rounded shoulders and trunk.  Pt continues to benefit from tactile cues and facilitation for trunk elongation when reaching and cues for WB through extended arm to facilitate upright sitting posture.    PERFORMANCE DEFICITS: in functional skills including ADLs, IADLs, coordination, sensation, ROM, strength, flexibility, Fine motor control, Gross motor control, balance, body mechanics, endurance, decreased knowledge of precautions, decreased knowledge of use of DME, and UE functional use and psychosocial skills including coping strategies, environmental adaptation, and routines and behaviors.    PLAN:  OT FREQUENCY: 1x/week  OT DURATION: 8  weeks  PLANNED INTERVENTIONS: 97168 OT Re-evaluation, 97535 self care/ADL training, 02889 therapeutic exercise, 97530 therapeutic activity, 97112 neuromuscular re-education, 97140 manual therapy, [id number] aquatic therapy, 97035 ultrasound, balance training,  functional mobility training, psychosocial skills training, energy conservation, coping strategies training, patient/family education, and DME and/or AE instructions  RECOMMENDED OTHER SERVICES: NA  CONSULTED AND AGREED WITH PLAN OF CARE: Patient  PLAN FOR NEXT SESSION: initiate coordination and strengthening HEP, may benefit from putty for grip strength, engage in unsupported sitting progressing to standing incorporating weight shifting and reaching, ? LUE WB in standing at elevated mat table vs in quadruped with min A   Natia Fahmy, OTR/L 04/14/2024, 11:19 AM  [facility] [address] [place], [address] [place], [place] [address] Phone # [phone] Fax # [phone]

## 2024-04-14 NOTE — Therapy (Signed)
 OUTPATIENT PHYSICAL THERAPY NEURO TREATMENT   Patient Name: Alexander [name] MRN: [id number] DOB:[birth year], 48 y.o., male Today's Date: 04/14/2024   PCP: [name] LABOR, MD REFERRING PROVIDER: Vear Charlie LABOR, MD  END OF SESSION:  PT End of Session - 04/14/24 1111     Visit Number 6    Number of Visits 9    Date for PT Re-Evaluation 05/04/24    Authorization Type BCBS Medicare    PT Start Time 1110    PT Stop Time 1150    PT Time Calculation (min) 40 min    Equipment Utilized During Treatment Gait belt    Activity Tolerance Patient tolerated treatment well    Behavior During Therapy WFL for tasks assessed/performed              Past Medical History:  Diagnosis Date   Hypertension    Multiple sclerosis (HCC) sees Dr. 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   Mid back pain 08/13/2018   Transverse myelitis (HCC) 02/19/2018   History of optic neuritis 02/10/2018   Hand weakness 07/10/2016   Spastic gait 03/07/2016   Multiple sclerosis (HCC) 03/07/2016   Urinary dysfunction 03/07/2016   Tremor 03/07/2016    ONSET DATE: 2005, 2012  REFERRING DIAG: G35 (ICD-10-CM) - Multiple sclerosis (HCC) R25.2 (ICD-10-CM) - Spasticity R26.9 (ICD-10-CM) - Gait disturbance M54.9 (ICD-10-CM) - Mid back pain R26.1 (ICD-10-CM) - Spastic gait R53.1 (ICD-10-CM) - Weakness  THERAPY DIAG:  Unsteadiness on feet  Other abnormalities of gait and mobility  Muscle weakness (generalized)  Rationale for Evaluation and Treatment: Rehabilitation  SUBJECTIVE:                                                                                                                                                                                              SUBJECTIVE STATEMENT: Want to keep working on core, strength.   Pt accompanied by: self  PERTINENT HISTORY: 48 y.o. man with a relapsing/active form of secondary progressive multiple sclerosis diagnosed in 2003.   Gait and balance are poor.  He uses a Rolator both inside and outside for short distance but a chair for longer distance.  He has a Chief of Staff wheelchair.  With a Rolator, he can go  > 100 feet.   Has some falls abput  1/month.   He does > 1000 steps a day.    He does worse in the heat.  Left arm is worse still but right hand seems to be catching up with more weakness.   Left > right legs have spasticity, worse at night. He takes baclofen  10 mg prn and CBD daily which helps a little.  Some left tremors.  Higher dose of baclofen  caused weakness.  He has poor coordination, left > right. Reports secondary progressive type MS  PAIN:  Are you having pain? No  PRECAUTIONS: Fall  RED FLAGS: None   WEIGHT BEARING RESTRICTIONS: No  FALLS: Has patient fallen in last 6 months? Yes. Number of falls 3, requiring fire department to assist  LIVING ENVIRONMENT: Lives with: lives with their family Lives in: House/apartment Stairs: ramp entrance Has following equipment at home: Environmental consultant - 2 wheeled, Environmental consultant - 4 wheeled, Wheelchair (power), shower chair, Tour manager, and Grab bars  PLOF: Independent with basic ADLs and Independent with household mobility with device,   PATIENT GOALS: improve posture, strength, stability  OBJECTIVE:     TODAY'S TREATMENT: 04/14/2024 Activity Comments  Seated postural/abdominal activities -forward lean to upright posture (no reistance) -forward lean to open posture with band Paloff press with yellow theraband, PT assist to hold, 2 x 5 reps Yellow theraband  Seated hamstring activation-against red therapy ball, 10 reps   Seated LAQ, 2 x 5 reps   Standing at RW:   Glut sets Gentle minisquats to upright  stand Gentle minisquat to lateral weightshift 3 x 5 reps  Car transfer, stand pivot with PT providing assist for lower extremity management   Short distance gait in session-15 ft x 2 with RW and CGA Extra time and weightshift for foot clearance    TODAY'S TREATMENT: 04/07/24 Activity Comments  Stand pivot transfer w/c<>nustep  Improved ability to perform sufficient step length compared to previous session. PT assists with transfer set up and CGA  nustep L1 x 6 min UEs/LEs  For LE stretching, endurance   in II bars: standing alt UE raise 10x  standing trunk rotation with and without ball standing glute sets 5x5 standing scapular retraction 5X5 Required min A to prevent posterior LOB with trunk rotation   Standing unsupported in II bars 16 sec, 25 sec with forward LOB  Car transfer with RW Min A provided for set up and LE management           PATIENT EDUCATION: Education details: continue current HEP; consider trying to get therapy ball for seated hamstring activation  Person educated: Patient Education method: Explanation Education comprehension: verbalized understanding    HOME EXERCISE PROGRAM Last updated: 03/15/24 Access Code: [email or web address] URL: [email or web address] Date: 03/15/2024 Prepared by: [facility] - Outpatient  Rehab - [facility]  Exercises - Seated Shoulder Extension and Scapular Retraction with Resistance  - 1 x daily - 5 x weekly - 2 sets - 10 reps - Seated Shoulder Row with Anchored Resistance  - 1 x daily - 5 x weekly - 2 sets - 10 reps - Seated Pelvic Tilts  - 1 x daily - 5 x weekly - 2 sets - 10 reps      Note: Objective measures were completed at Evaluation unless otherwise noted.  DIAGNOSTIC FINDINGS: MRI 09/09/2023 showed T2/FLAIR hyperintense foci in the cerebral hemispheres, cerebellum and brainstem in a pattern consistent with chronic demyelinating plaque associated with multiple sclerosis. No other foci enhanced or appear to be  acute. Compared to the MRI from  08/22/2022, there were no new lesions.  COGNITION: Overall cognitive status: Within functional limits for tasks assessed   SENSATION: Denies any parasthesias  COORDINATION: Grossly limited   EDEMA:  none  MUSCLE TONE: spasticity BLE, sustained clonus on R, 3-4 bear L. Bilat quads, hamstring flaccid  MUSCLE LENGTH: Hamstrings: Right -10 deg; Left -10 deg   DTRs:  Patella 3+ = Brisk  POSTURE: rounded shoulders, forward head, and increased thoracic kyphosis  LOWER EXTREMITY ROM:     Active  Right Eval Left Eval  Hip flexion 4 0  Hip extension    Hip abduction    Hip adduction    Hip internal rotation    Hip external rotation    Knee flexion 110 110  Knee extension 0 0  Ankle dorsiflexion 0 5  Ankle plantarflexion    Ankle inversion    Ankle eversion     (Blank rows = not tested)  LOWER EXTREMITY MMT:    MMT Right Eval Left Eval  Hip flexion 2- 2-  Hip extension    Hip abduction 2 2  Hip adduction 2 2  Hip internal rotation    Hip external rotation    Knee flexion 2+ 2  Knee extension 4- 3+  Ankle dorsiflexion 0 2  Ankle plantarflexion    Ankle inversion    Ankle eversion    (Blank rows = not tested)  BED MOBILITY:  Reports he uses a rope on the foot board to long sit->sit EOB  TRANSFERS: Sit to stand: Modified independence  Assistive device utilized: Environmental consultant - 4 wheeled     Stand to sit: Modified independence  Assistive device utilized: Environmental consultant - 4 wheeled     Chair to chair: SBA  Assistive device utilized: Environmental consultant - 4 wheeled       RAMP:  Not tested  CURB:  Not tested  STAIRS: Not tested GAIT: Findings: Gait Characteristics: Right foot flat, wide BOS, and poor foot clearance- Left, Distance walked: 25 ft, Level of assistance: SBA and CGA, and Comments:    FUNCTIONAL TESTS:  5 times sit to stand: 31 sec UE use 2 of 5 Timed up and go (TUG): 1 min 49 sec w/ 5TT Romberg: 23 sec eyes open w/ retro-LOB Berg  Balance Test: TBD Function In Sitting Test: 45/56                                                                                                                                TREATMENT DATE:     PATIENT EDUCATION: Education details: assessment details Person educated: Patient Education method: Explanation Education comprehension: verbalized understanding  HOME EXERCISE PROGRAM: TBD  GOALS: Goals reviewed with patient? Yes  SHORT TERM GOALS: Target date: 04/06/2024    Patient will be independent in HEP to improve functional outcomes Baseline: Goal status: MET 04/07/24  2.  Improve unsupported standing x 60 sec to improve ADL participation Baseline: 23 sec w/ retro-LOB;  25 sec with forward LOB 04/07/24  Goal status: NOT MET S 04/07/24   3.  Teach-back strategies/techniques/activities to address forward head/rounded shoulder posture to improve upright posture for enhanced balance Baseline: edu provided today 04/07/24  Goal status: NOT MET 04/07/24     LONG TERM GOALS: Target date: 05/04/2024    Demo improved BLE strength and reduced risk for falls per time 20 sec 5xSTS test Baseline: 31 sec w/ UE support 2 of 5 reps Goal status:IN PROGRESS  2.  Demo improved mobility and reduced risk for falls per time 75 sec TUG test Baseline: 1 min 49 sec w/ 4WW Goal status: IN PROGRESS  3.  Berg Balance Test TBD Baseline:  Goal status: IN PROGRESS  4.  Ambulation level surfaces x 150 ft w/ modified independence to improve safety and efficiency with household ambulation Baseline: up to 100 ft w/ SBA and 4WW Goal status: IN PROGRESS  5.  Perform car transfers w/ supervision to reduce level of assistance from caregivers  Baseline: min-mod A  Goal status: IN PROGRESS  ASSESSMENT:  CLINICAL IMPRESSION: Pt presents today with no new complaints. Skilled PT session focused on seated posture exercises and lower extremity strength, as well as standing exercises at his RW. Pt likes the  seated hamstring activation exercise, with knee flexion into red therapy ball; discussed ways he can work on this at home.  RLE fatigues quickly, and he needs assist with BLEs for management into his car at end of session; he reports not yet getting leg lifters for home use.  Pt will continue to benefit from skilled PT towards goals for improved functional mobility and decreased fall risk.     OBJECTIVE IMPAIRMENTS: Abnormal gait, decreased activity tolerance, decreased balance, decreased coordination, decreased endurance, decreased mobility, difficulty walking, decreased ROM, decreased strength, impaired flexibility, impaired tone, impaired UE functional use, and postural dysfunction.   ACTIVITY LIMITATIONS: carrying, lifting, bending, sitting, standing, squatting, transfers, bed mobility, reach over head, and locomotion level  PARTICIPATION LIMITATIONS: meal prep, cleaning, laundry, interpersonal relationship, community activity, and exercise participation  PERSONAL FACTORS: Age, Past/current experiences, and Time since onset of injury/illness/exacerbation are also affecting patient's functional outcome.   REHAB POTENTIAL: Excellent  CLINICAL DECISION MAKING: Evolving/moderate complexity  EVALUATION COMPLEXITY: Moderate  PLAN:  PT FREQUENCY: 1x/week  PT DURATION: 8 weeks  PLANNED INTERVENTIONS: 97750- Physical Performance Testing, 97110-Therapeutic exercises, 97530- Therapeutic activity, [id number]- Neuromuscular re-education, 97535- Self Care, [id number]- Manual therapy, [id number]- Gait training, [phone]- Orthotic Initial, [phone]- Orthotic/Prosthetic subsequent, [phone]- Aquatic Therapy, [phone]- Electrical stimulation (unattended), and 20560 (1-2 muscles), 20561 (3+ muscles)- Dry Needling  PLAN FOR NEXT SESSION: Ask about leg/thigh lifters for car transfers? sitting core strengthening, postural re-education/activities (can he get to prone for things like chin/scap retraction?-he says no, 04/14/2024);  standing and seated weightshifting activities  Greig Anon, PT 04/14/24 5:24 PM Phone: [phone] Fax: [phone]  [facility] Outpatient Rehab at [facility] [address] [place], [place] [address] Phone # [phone] Fax # [phone]

## 2024-04-21 ENCOUNTER — Ambulatory Visit: Admitting: Physical Therapy

## 2024-04-21 ENCOUNTER — Ambulatory Visit: Admitting: Occupational Therapy

## 2024-04-28 ENCOUNTER — Encounter: Payer: Self-pay | Admitting: Physical Therapy

## 2024-04-28 ENCOUNTER — Ambulatory Visit: Admitting: Physical Therapy

## 2024-04-28 ENCOUNTER — Ambulatory Visit: Admitting: Occupational Therapy

## 2024-04-28 DIAGNOSIS — R262 Difficulty in walking, not elsewhere classified: Secondary | ICD-10-CM | POA: Diagnosis not present

## 2024-04-28 DIAGNOSIS — R2689 Other abnormalities of gait and mobility: Secondary | ICD-10-CM

## 2024-04-28 DIAGNOSIS — M6281 Muscle weakness (generalized): Secondary | ICD-10-CM

## 2024-04-28 DIAGNOSIS — R29818 Other symptoms and signs involving the nervous system: Secondary | ICD-10-CM

## 2024-04-28 DIAGNOSIS — R278 Other lack of coordination: Secondary | ICD-10-CM | POA: Diagnosis not present

## 2024-04-28 DIAGNOSIS — R2681 Unsteadiness on feet: Secondary | ICD-10-CM

## 2024-04-28 NOTE — Therapy (Signed)
 OUTPATIENT PHYSICAL THERAPY NEURO TREATMENT   Patient Name: Alexander [name] MRN: [id number] DOB:[birth year], 48 y.o., male Today's Date: 04/28/2024   PCP: Alexander Garnette LABOR, MD REFERRING PROVIDER: Vear Alexander LABOR, MD  END OF SESSION:  PT End of Session - 04/28/24 1107     Visit Number 7    Number of Visits 9    Date for Recertification  05/04/24    Authorization Type BCBS Medicare    PT Start Time 1107    PT Stop Time 1150   assisted out to car   PT Time Calculation (min) 43 min    Equipment Utilized During Treatment Gait belt    Activity Tolerance Patient tolerated treatment well    Behavior During Therapy WFL for tasks assessed/performed              Past Medical History:  Diagnosis Date   Hypertension    Multiple sclerosis sees Dr. 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   Mid back pain 08/13/2018   Transverse myelitis (HCC) 02/19/2018   History of optic neuritis 02/10/2018   Hand weakness 07/10/2016   Spastic gait 03/07/2016   Multiple sclerosis 03/07/2016   Urinary dysfunction 03/07/2016   Tremor 03/07/2016    ONSET DATE: 2005, 2012  REFERRING DIAG: G35 (ICD-10-CM) - Multiple sclerosis (HCC) R25.2 (ICD-10-CM) - Spasticity R26.9 (ICD-10-CM) - Gait disturbance M54.9 (ICD-10-CM) - Mid back pain R26.1 (ICD-10-CM) - Spastic gait R53.1 (ICD-10-CM) - Weakness  THERAPY DIAG:  Muscle weakness (generalized)  Other abnormalities of gait and mobility  Unsteadiness on feet  Rationale for Evaluation and Treatment: Rehabilitation  SUBJECTIVE:                                                                                                                                                                                              SUBJECTIVE STATEMENT: Had a fall, with R side giving way; got helped right up and no injury.   Pt accompanied by: self  PERTINENT HISTORY: 48 y.o. man with a relapsing/active form of secondary progressive multiple sclerosis diagnosed in 2003.   Gait and balance are poor.  He uses a Rolator both inside and outside for short distance but a chair for longer distance.  He has a Chief of Staff wheelchair.  With a Rolator, he can  go  > 100 feet.   Has some falls abput 1/month.   He does > 1000 steps a day.    He does worse in the heat.  Left arm is worse still but right hand seems to be catching up with more weakness.   Left > right legs have spasticity, worse at night. He takes baclofen  10 mg prn and CBD daily which helps a little.  Some left tremors.  Higher dose of baclofen  caused weakness.  He has poor coordination, left > right. Reports secondary progressive type MS  PAIN:  Are you having pain? No  PRECAUTIONS: Fall  RED FLAGS: None   WEIGHT BEARING RESTRICTIONS: No  FALLS: Has patient fallen in last 6 months? Yes. Number of falls 3, requiring fire department to assist  LIVING ENVIRONMENT: Lives with: lives with their family Lives in: House/apartment Stairs: ramp entrance Has following equipment at home: Environmental consultant - 2 wheeled, Environmental consultant - 4 wheeled, Wheelchair (power), shower chair, Tour manager, and Grab bars  PLOF: Independent with basic ADLs and Independent with household mobility with device,   PATIENT GOALS: improve posture, strength, stability  OBJECTIVE:    TODAY'S TREATMENT: 04/28/2024 Activity Comments  Reviewed HEP   Seated hamstring activation-against red therapy ball, 2 x10 reps   Seated LAQ, 2 x 5 reps   Standing at RW:   Glut sets Gentle minisquats to upright stand Gentle minisquat to lateral weightshift 2 x 5 reps  Car transfer, stand pivot with PT providing assist for lower extremity management   Short distance  gait in session-10 ft then 20 ft with RW and CGA Extra time and weightshift for foot clearance *utilized simulated toe cap on R shoe for improved clearance and RLE step length       PATIENT EDUCATION: Education details: Addition to HEP-see below.  Also discussed potential for leather toe cap and provided pt with number to orthotist.  Discussed POC and pt would like to discharge next visit.    Person educated: Patient Education method: Explanation Education comprehension: verbalized understanding    HOME EXERCISE PROGRAM Access Code: [email or web address] URL: [email or web address] Date: 04/28/2024 Prepared by: [facility] - Outpatient  Rehab - [facility]  Exercises - Seated Shoulder Extension and Scapular Retraction with Resistance  - 1 x daily - 5 x weekly - 2 sets - 10 reps - Seated Shoulder Row with Anchored Resistance  - 1 x daily - 5 x weekly - 2 sets - 10 reps - Seated Pelvic Tilts  - 1 x daily - 5 x weekly - 2 sets - 10 reps - Seated Anti-Rotation Press With Anchored Resistance  - 1 x daily - 5 x weekly - 2 sets - 10 reps - Seated Active Straight-Leg Raise  - 1 x daily - 5 x weekly - 2 sets - 10 reps - Seated Hamstring Set  - 1 x daily - 5 x weekly - 3 sets - 5 reps - Standing Gluteal Sets  - 1 x daily - 5 x weekly - 3 sets - 5 reps - Mini Squat with Counter Support  - 1 x daily - 5 x weekly - 3 sets - 5 reps - Side to side weightshift  - 1 x daily - 5 x weekly - 3 sets - 5 reps     Note: Objective measures were completed at Evaluation unless otherwise noted.  DIAGNOSTIC FINDINGS: MRI 09/09/2023 showed T2/FLAIR hyperintense foci in the cerebral hemispheres, cerebellum and brainstem in a pattern consistent with chronic demyelinating  plaque associated with multiple sclerosis. No other foci enhanced or appear to be acute. Compared to the MRI from 08/22/2022, there were no new lesions.  COGNITION: Overall cognitive status: Within functional limits for tasks  assessed   SENSATION: Denies any parasthesias  COORDINATION: Grossly limited   EDEMA:  none  MUSCLE TONE: spasticity BLE, sustained clonus on R, 3-4 bear L. Bilat quads, hamstring flaccid  MUSCLE LENGTH: Hamstrings: Right -10 deg; Left -10 deg   DTRs:  Patella 3+ = Brisk  POSTURE: rounded shoulders, forward head, and increased thoracic kyphosis  LOWER EXTREMITY ROM:     Active  Right Eval Left Eval  Hip flexion 4 0  Hip extension    Hip abduction    Hip adduction    Hip internal rotation    Hip external rotation    Knee flexion 110 110  Knee extension 0 0  Ankle dorsiflexion 0 5  Ankle plantarflexion    Ankle inversion    Ankle eversion     (Blank rows = not tested)  LOWER EXTREMITY MMT:    MMT Right Eval Left Eval  Hip flexion 2- 2-  Hip extension    Hip abduction 2 2  Hip adduction 2 2  Hip internal rotation    Hip external rotation    Knee flexion 2+ 2  Knee extension 4- 3+  Ankle dorsiflexion 0 2  Ankle plantarflexion    Ankle inversion    Ankle eversion    (Blank rows = not tested)  BED MOBILITY:  Reports he uses a rope on the foot board to long sit->sit EOB  TRANSFERS: Sit to stand: Modified independence  Assistive device utilized: Environmental consultant - 4 wheeled     Stand to sit: Modified independence  Assistive device utilized: Environmental consultant - 4 wheeled     Chair to chair: SBA  Assistive device utilized: Environmental consultant - 4 wheeled       RAMP:  Not tested  CURB:  Not tested  STAIRS: Not tested GAIT: Findings: Gait Characteristics: Right foot flat, wide BOS, and poor foot clearance- Left, Distance walked: 25 ft, Level of assistance: SBA and CGA, and Comments:    FUNCTIONAL TESTS:  5 times sit to stand: 31 sec UE use 2 of 5 Timed up and go (TUG): 1 min 49 sec w/ 5TT Romberg: 23 sec eyes open w/ retro-LOB Berg Balance Test: TBD Function In Sitting Test: 45/56                                                                                                                                 TREATMENT DATE:     PATIENT EDUCATION: Education details: assessment details Person educated: Patient Education method: Explanation Education comprehension: verbalized understanding  HOME EXERCISE PROGRAM: TBD  GOALS: Goals reviewed with patient? Yes  SHORT TERM GOALS: Target date: 04/06/2024    Patient will be independent in HEP to improve functional outcomes Baseline: Goal status: MET  04/07/24  2.  Improve unsupported standing x 60 sec to improve ADL participation Baseline: 23 sec w/ retro-LOB;  25 sec with forward LOB 04/07/24  Goal status: NOT MET S 04/07/24   3.  Teach-back strategies/techniques/activities to address forward head/rounded shoulder posture to improve upright posture for enhanced balance Baseline: edu provided today 04/07/24  Goal status: NOT MET 04/07/24     LONG TERM GOALS: Target date: 05/04/2024    Demo improved BLE strength and reduced risk for falls per time 20 sec 5xSTS test Baseline: 31 sec w/ UE support 2 of 5 reps Goal status:IN PROGRESS  2.  Demo improved mobility and reduced risk for falls per time 75 sec TUG test Baseline: 1 min 49 sec w/ 4WW Goal status: IN PROGRESS  3.  Berg Balance Test TBD Baseline:  Goal status: IN PROGRESS  4.  Ambulation level surfaces x 150 ft w/ modified independence to improve safety and efficiency with household ambulation Baseline: up to 100 ft w/ SBA and 4WW Goal status: IN PROGRESS  5.  Perform car transfers w/ supervision to reduce level of assistance from caregivers  Baseline: min-mod A  Goal status: IN PROGRESS  ASSESSMENT:  CLINICAL IMPRESSION: Pt presents today with reports of one fall since last PT visit; he cancelled last PT visit. Skilled PT session focused on updating HEP to address activation of hip extensors, quads and hamstrings.  Given pt's reports of difficulty/max assist needed for prone and supine positioning, continued to work these muscle groups in sitting and  standing.  Brief trial of gait with shoe cover simulating leather toe cap and pt has improved RLE foot clearance and step length.  Recommended he can follow up with orthotist for leather toe cap, as he seemed to like this.   Plan to formally assess goals and discharge next week (pt states he is ready for a break from therapy).   OBJECTIVE IMPAIRMENTS: Abnormal gait, decreased activity tolerance, decreased balance, decreased coordination, decreased endurance, decreased mobility, difficulty walking, decreased ROM, decreased strength, impaired flexibility, impaired tone, impaired UE functional use, and postural dysfunction.   ACTIVITY LIMITATIONS: carrying, lifting, bending, sitting, standing, squatting, transfers, bed mobility, reach over head, and locomotion level  PARTICIPATION LIMITATIONS: meal prep, cleaning, laundry, interpersonal relationship, community activity, and exercise participation  PERSONAL FACTORS: Age, Past/current experiences, and Time since onset of injury/illness/exacerbation are also affecting patient's functional outcome.   REHAB POTENTIAL: Excellent  CLINICAL DECISION MAKING: Evolving/moderate complexity  EVALUATION COMPLEXITY: Moderate  PLAN:  PT FREQUENCY: 1x/week  PT DURATION: 8 weeks  PLANNED INTERVENTIONS: 97750- Physical Performance Testing, 97110-Therapeutic exercises, 97530- Therapeutic activity, [id number]- Neuromuscular re-education, 97535- Self Care, 02859- Manual therapy, [id number]- Gait training, [phone]- Orthotic Initial, [id number]- Orthotic/Prosthetic subsequent, [id number]- Aquatic Therapy, H9716- Electrical stimulation (unattended), and 20560 (1-2 muscles), 20561 (3+ muscles)- Dry Needling  PLAN FOR NEXT SESSION: Need to check goals (recert/discharge) next visit.  Ask if he followed up about leather toe cap  Greig Anon, PT 04/28/24 12:42 PM Phone: [phone] Fax: [phone]  [facility] at [facility] [address], [address] [place], [place] [address] Phone # [phone] Fax # [phone]

## 2024-04-28 NOTE — Therapy (Signed)
 OUTPATIENT OCCUPATIONAL THERAPY NEURO  Treatment Note  Patient Name: Emmet Messer MRN: [id number] DOB:03-02-1976, 48 y.o., male Today's Date: 04/28/2024  PCP: [name] LABOR, MD REFERRING PROVIDER: Vear Charlie LABOR, MD  END OF SESSION:  OT End of Session - 04/28/24 1058     Visit Number 7    Number of Visits 9    Date for Recertification  05/07/24    Authorization Type [employer] Medicare 2025    OT Start Time 1017    OT Stop Time 1057    OT Time Calculation (min) 40 min                Past Medical History:  Diagnosis Date   Hypertension    Multiple sclerosis sees Dr. 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   Mid back pain 08/13/2018   Transverse myelitis (HCC) 02/19/2018   History of optic neuritis 02/10/2018   Hand weakness 07/10/2016   Spastic gait 03/07/2016   Multiple sclerosis 03/07/2016   Urinary dysfunction 03/07/2016   Tremor 03/07/2016    ONSET DATE: referral date 11/04/23  REFERRING DIAG: G35 (ICD-10-CM) - Multiple sclerosis (HCC) R25.2 (ICD-10-CM) - Spasticity R26.9 (ICD-10-CM) - Gait disturbance M54.9 (ICD-10-CM) - Mid back pain R26.1 (ICD-10-CM) - Spastic gait R53.1 (ICD-10-CM) - Weakness  THERAPY DIAG:  Muscle weakness (generalized)  Other symptoms and signs involving the nervous system  Other abnormalities of gait and mobility  Rationale for Evaluation and Treatment: Rehabilitation  SUBJECTIVE:   SUBJECTIVE STATEMENT: Pt reports that he took a mental health day last week.  Pt reports that he had a fall last week, lost balance while walking to the bathroom.  FD came and got him up.    Pt accompanied by: family member (mother dropped him off)  PERTINENT  HISTORY: 48 y.o. man with a relapsing/active form of secondary progressive multiple sclerosis diagnosed in 2003.   Gait and balance are poor.  He uses a Rolator both inside and outside for short distance but a chair for longer distance.  He has a Chief of Staff wheelchair.  With a Rolator, he can go  > 100 feet.   Has some falls abput 1/month.   He does > 1000 steps a day.    He does worse in the heat.  Left arm is worse still but right hand seems to be catching up with more weakness.   Left > right legs have spasticity, worse at night. He takes baclofen  10 mg prn and CBD daily which helps a little.  Some left tremors.  Higher dose of baclofen  caused weakness.  He has poor coordination, left > right. Reports secondary progressive type MS   PRECAUTIONS: Fall  WEIGHT BEARING RESTRICTIONS: No  PAIN:  Are you having pain? No  FALLS: Has patient fallen in last 6 months? Yes. Number of falls 3, requiring fire department to assist  LIVING ENVIRONMENT: Lives with: lives with their family Lives in: House/apartment Stairs: ramp entrance Has following equipment at home: Vannie - 2 wheeled, Environmental consultant - 4 wheeled, Wheelchair (power), shower chair, and Grab bars  PLOF: Independent with basic ADLs, Independent with household mobility with device, and Requires assistive device for independence  PATIENT GOALS: improved posture, functional use of arms and hands  OBJECTIVE:  Note: Objective measures were completed at Evaluation unless otherwise noted.  HAND DOMINANCE: Right  ADLs: Overall ADLs: I have adapted Transfers/ambulation related to ADLs: uses Rollator for short distances in the home and hand walks into bathroom holding on to counters and railing in bathroom Eating: difficulty with opening containers, scooping foods Grooming: spasticity in arms impacts ease with brushing teeth UB Dressing: Mod I, every once in awhile has some difficulty getting shirt on LB Dressing: Mod I Toileting:  uses railing in bathroom and hand walking to get in and around bathroom Bathing: Mod I Tub Shower transfers: Mod I Equipment: Shower seat with back, Grab bars, and Walk in shower  IADLs: Shopping: mother performs Light housekeeping: mother performs Meal Prep: mother performs Community mobility: mother provides transportation Medication management: Mod I Financial management: Mod I Handwriting: reports he rarely writes anymore  MOBILITY STATUS: Needs Assist: Mod I stand pivot transfers with Rollator, Supervision short distance ambulation with Rollator  POSTURE COMMENTS:  rounded shoulders, forward head, increased thoracic kyphosis, and weight shift left  ACTIVITY TOLERANCE: Activity tolerance: requires a couple days to recover from therapy appts  FUNCTIONAL OUTCOME MEASURES: PSFS   UPPER EXTREMITY ROM:    Active ROM Right eval Left eval  Shoulder flexion 114 130  Shoulder abduction 90 100  Shoulder adduction    Shoulder extension    Shoulder internal rotation    Shoulder external rotation    Elbow flexion [facility] WFL  Elbow extension [facility] WFL  Wrist flexion [facility] WFL  Wrist extension [facility] WFL  Wrist ulnar deviation    Wrist radial deviation    Wrist pronation    Wrist supination    (Blank rows = not tested)  UPPER EXTREMITY MMT:   increased spasticity noted in L with attempts at MMT  MMT Right eval Left eval  Shoulder flexion 3 4-  Shoulder abduction 3+ 4  Shoulder adduction    Shoulder extension    Shoulder internal rotation    Shoulder external rotation    Middle trapezius    Lower trapezius    Elbow flexion 4 4  Elbow extension 4 4  Wrist flexion 3+ 4-  Wrist extension 3+ 4-  Wrist ulnar deviation    Wrist radial deviation    Wrist pronation    Wrist supination    (Blank rows = not tested)  HAND FUNCTION: Grip strength: Right: 40 (40, 36, and 46) lbs; Left: 65 lbs  COORDINATION: 9 Hole Peg test: Right: 2:03 (required 1:35 to place 9 pegs); Left:  2:35 (1:57 to place pegs) Box and Blocks:  Right 25 blocks, Left 18 blocks  SENSATION: Decreased sensation bilaterally, impacting ease with picking up items. I couldn't feel what I was touching.  MUSCLE TONE: increased spasticity LUE > RUE  COGNITION: Overall cognitive status: Within functional limits for tasks assessed  VISION: Subjective report: optic neuritis in the R eye, I have lost vision in the R eye Baseline vision: Wears glasses all the time  TREATMENT DATE:  04/28/24 Gross motor control: reaching out to side for shoulder abduction and weight shifting to retrieve a clothespins and then place onto vertical dowel to facilitate shoulder flexion and upright posture.  Pt demonstrating decreased motor control when reaching overhead > reaching out to side with LUE.  Completed on R, pt with decreased abduction and weight shift to R and into flexion, however more controlled in movement.  Utilizing 4 and 6# resistance clothespins with LUE and 4# with RUE due to decreased grip strength. Shoulder ROM: Utilizing red theraband completing scapular retraction and horizontal abduction to R and then to L due to decreased bilateral ROM.  OT providing tactile cues at back, between and below scapula for upright posture and technique.  OT encouraging pt to complete horizontal exercises unilaterally to allow for focus and attention to postural control and support as pt with decreased postural control and rounding of back when attempting BUE together.      04/14/24 Gross motor control: engaged in picking up small rings with good lateral pinch with RUE and moving them to place onto vertical targets.  OT increasing distance of targets to facilitate increased ROM and reach.  OT providing instruction to WB through LUE during reaching for added NMR of WB as well as improved upright  posture and trunk control.  Transitioned to reaching with LUE.  Pt demonstrating good reach, however demonstrating tremors due to spasticity.  Engaged in reaching for cones incorporating increased weight shifting with reaching further outside BOS and across midline.  Pt again WB through LUE while reaching with RUE and then transitioned to reaching with LUE.  Pt initially knocking over cones from lower surfaces on L with LUE, however with cues to focus on weight shifting and hand opening, pt with increased motor control.  OT providing intermittent tactile cue at torso to facilitate elongation and upright posture. Box and blocks: Right: 27 blocks, Left: 18 blocks Pt asking questions about methods to decrease tremors.  Pt educating on WB, focus on proximal and core strength, and potential benefit of weight or compression, and/or medication.  Encouraged pt to f/u with MD if desire to pursue anything medically.    04/07/24 Transitional movements: pt completed stand pivot transfer to Left with UE support on back of chair with close supervision.  Pt requiring increased time and smaller steps during transfer this session.  Transitioned to supine on mat table with increased time to gather RLE and advance onto mat table.  OT providing physical assistance with moving RLE over onto mat while pt brought LLE to mat table.  OT providing physical assistance to readjust BLE while pt scooting over on mat table.  Pt positioned semi-reclined with wedge and pillows under upper back and head for improved positioning.  Pt requiring max assist for semi-reclined to sitting in circle sitting on mat table after exercises.  Pt then scooting to R with mod assist to shift hips and then scooting forward with min assist with therapist assist to lower LLE to floor.   Shoulder ROM: engaged in open book stretch and shoulder flexion in semi-reclined position with sustained hold at end range with each exercise.  OT providing min cues to focus on  quality of movement with LUE and stretch bilaterally.  Once sitting upright, engaged in posterior lean with therapist providing support along spine to facilitate scapular retraction.   NMR: engaged in weight shifting to tap targets to R and L in sitting.  OT providing facilitation at hips and  upper back to facilitate trunk elongation during weight shifting.     PATIENT EDUCATION: Education details: educated on functional tasks to carryover weight shifting and postural control at home Person educated: Patient Education method: Programmer, multimedia, Demonstration, and Verbal cues Education comprehension: verbalized understanding and needs further education  HOME EXERCISE PROGRAM: TBD   GOALS: Goals reviewed with patient? Yes  SHORT TERM GOALS: Target date: 04/09/24  Pt will be independent with gross and fine motor control HEP. Baseline: new to OPOT Goal status: in progress  2.  Pt will demonstrate improved UE functional use for ADLs as evidenced by increasing box/ blocks score by 4 blocks with BUE Baseline: Right 25 blocks, Left 18 blocks 04/14/24: R: 27 blocks, Left: 18 blocks Goal status: in progress  3.  Pt will verbalize understanding of energy conservation techniques.  Baseline: new to OPOT Goal status: in progress  4.  Pt will verbalize understanding of task modifications and/or potential DME / AE needs to increase ease, safety, and independence w/ ADLs. Baseline:  Goal status: in progress  5.  Pt will verbalize understanding of safety concerns and precautions due to impaired sensation in BUE. Baseline: impaired sensation Goal status: in progress    LONG TERM GOALS: Target date: 05/07/24  Pt will demonstrate improved UE functional use for ADLs as evidenced by increasing box/ blocks score by 6 blocks with BUE Baseline: Right 25 blocks, Left 18 blocks Goal status: in progress  2.  Pt will demonstrate improved trunk/postural control with ability to shift in and out of midline and  recognize when losing balance during seated or standing ADLs. Baseline: Left lean in unsupported position Goal status: in progress  3.   Pt will demonstrate improved grip strength by 5# on R to increase ease with opening grooming and food containers. Baseline: R: 40# Goal status: in progress  4.  Pt will perform dynamic standing task for 5 mins for simple IADLS w/o LOB using DME and/or countertop support prn Baseline: Right lean in standing Goal status: in progress  5.  Pt will demonstrate improved shoulder flexion on RUE by 10# to allow for increased reach and strength to remove light-moderate weight items as needed for ADLs and IADLs.  Baseline: RUE 3- to 4 Goal status: in progress  6.  Patient will report at least two-point increase in average PSFS score or at least three-point increase in a single activity score indicating functionally significant improvement given minimum detectable change. Baseline: 4.3 Goal status: in progress  ASSESSMENT:  CLINICAL IMPRESSION: Patient is a 48 y.o. male who was seen today for occupational therapy treatment session for weakness and spasticity due to multiple sclerosis. Pt demonstrating ongoing posterior weight shift and rounded shoulders, frequently placing LUE into lap further impacting forward, rounded shoulders and trunk.  Pt with improved upright sitting posture this hand and positioning and placement of LUE during R sided reaching.  Pt demonstrating good upper body positioning during scapular retraction, min tactile cues but improved carryover with repetition.  Pt continues to benefit from tactile cues and facilitation for trunk elongation when reaching and cues for WB through extended arm to facilitate upright sitting posture.    PERFORMANCE DEFICITS: in functional skills including ADLs, IADLs, coordination, sensation, ROM, strength, flexibility, Fine motor control, Gross motor control, balance, body mechanics, endurance, decreased knowledge of  precautions, decreased knowledge of use of DME, and UE functional use and psychosocial skills including coping strategies, environmental adaptation, and routines and behaviors.    PLAN:  OT FREQUENCY:  1x/week  OT DURATION: 8 weeks  PLANNED INTERVENTIONS: 97168 OT Re-evaluation, 97535 self care/ADL training, 02889 therapeutic exercise, 97530 therapeutic activity, 97112 neuromuscular re-education, 97140 manual therapy, 97113 aquatic therapy, 97035 ultrasound, balance training, functional mobility training, psychosocial skills training, energy conservation, coping strategies training, patient/family education, and DME and/or AE instructions  RECOMMENDED OTHER SERVICES: NA  CONSULTED AND AGREED WITH PLAN OF CARE: Patient  PLAN FOR NEXT SESSION: initiate coordination and strengthening HEP, may benefit from putty for grip strength, engage in unsupported sitting progressing to standing incorporating weight shifting and reaching, ? LUE WB in standing at elevated mat table vs in quadruped with min A   Roel Douthat, OTR/L 04/28/2024, 10:58 AM  [facility] at [facility] [address] [place], [address] [place], [place] [address] Phone # [phone] Fax # [phone]

## 2024-05-04 NOTE — Therapy (Incomplete)
 OUTPATIENT PHYSICAL THERAPY NEURO TREATMENT   Patient Name: Alexander [name] MRN: [id number] DOB:July 10, 1976, 48 y.o., male Today's Date: 05/04/2024   PCP: [name] LABOR, MD REFERRING PROVIDER: Vear Charlie LABOR, MD  END OF SESSION:        Past Medical History:  Diagnosis Date   Hypertension    Multiple sclerosis sees Dr. 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  Rationale for Evaluation and Treatment: Rehabilitation  SUBJECTIVE:                                                                                                                                                                                             SUBJECTIVE STATEMENT: Had a fall, with R side giving way; got helped right up and no injury.   Pt accompanied by: self  PERTINENT HISTORY: 48 y.o. man with a relapsing/active form of secondary progressive multiple sclerosis diagnosed in 2003.   Gait and balance are poor.  He uses a Rolator both inside and outside for short distance but a chair for longer distance.  He has a Chief of Staff wheelchair.  With a Rolator, he can go  > 100 feet.   Has  some falls abput 1/month.   He does > 1000 steps a day.    He does worse in the heat.  Left arm is worse still but right hand seems to be catching up with more weakness.   Left > right legs have spasticity, worse at night. He takes baclofen  10 mg prn and CBD daily which helps a little.  Some left tremors.  Higher dose of baclofen  caused weakness.  He has poor coordination, left > right. Reports secondary progressive type MS  PAIN:  Are you having pain? No  PRECAUTIONS: Fall  RED FLAGS: None  WEIGHT BEARING RESTRICTIONS: No  FALLS: Has patient fallen in last 6 months? Yes. Number of falls 3, requiring fire department to assist  LIVING ENVIRONMENT: Lives with: lives with their family Lives in: House/apartment Stairs: ramp entrance Has following equipment at home: Environmental consultant - 2 wheeled, Environmental consultant - 4 wheeled, Wheelchair (power), shower chair, Tour manager, and Grab bars  PLOF: Independent with basic ADLs and Independent with household mobility with device,   PATIENT GOALS: improve posture, strength, stability  OBJECTIVE:     TODAY'S TREATMENT: 05/04/24 Activity Comments                        TODAY'S TREATMENT: 04/28/2024 Activity Comments  Reviewed HEP   Seated hamstring activation-against red therapy ball, 2 x10 reps   Seated LAQ, 2 x 5 reps   Standing at RW:   Glut sets Gentle minisquats to upright stand Gentle minisquat to lateral weightshift 2 x 5 reps  Car transfer, stand pivot with PT providing assist for lower extremity management   Short distance gait in session-10 ft then 20 ft with RW and CGA Extra time and weightshift for foot clearance *utilized simulated toe cap on R shoe for improved clearance and RLE step length       PATIENT EDUCATION: Education details: Addition to HEP-see below.  Also discussed potential for leather toe cap and provided pt with number to orthotist.  Discussed POC and pt would like to discharge next visit.    Person educated:  Patient Education method: Explanation Education comprehension: verbalized understanding    HOME EXERCISE PROGRAM Access Code: [email or web address] URL: [email or web address] Date: 04/28/2024 Prepared by: [facility] - Outpatient  Rehab - [facility]  Exercises - Seated Shoulder Extension and Scapular Retraction with Resistance  - 1 x daily - 5 x weekly - 2 sets - 10 reps - Seated Shoulder Row with Anchored Resistance  - 1 x daily - 5 x weekly - 2 sets - 10 reps - Seated Pelvic Tilts  - 1 x daily - 5 x weekly - 2 sets - 10 reps - Seated Anti-Rotation Press With Anchored Resistance  - 1 x daily - 5 x weekly - 2 sets - 10 reps - Seated Active Straight-Leg Raise  - 1 x daily - 5 x weekly - 2 sets - 10 reps - Seated Hamstring Set  - 1 x daily - 5 x weekly - 3 sets - 5 reps - Standing Gluteal Sets  - 1 x daily - 5 x weekly - 3 sets - 5 reps - Mini Squat with Counter Support  - 1 x daily - 5 x weekly - 3 sets - 5 reps - Side to side weightshift  - 1 x daily - 5 x weekly - 3 sets - 5 reps     Note: Objective measures were completed at Evaluation unless otherwise noted.  DIAGNOSTIC FINDINGS: MRI 09/09/2023 showed T2/FLAIR hyperintense foci in the cerebral hemispheres, cerebellum and brainstem in a pattern consistent with chronic demyelinating plaque associated with multiple sclerosis. No other foci enhanced or appear to be acute. Compared to the MRI from 08/22/2022, there were no new lesions.  COGNITION: Overall cognitive status: Within functional limits for tasks assessed   SENSATION: Denies any parasthesias  COORDINATION: Grossly limited   EDEMA:  none  MUSCLE TONE: spasticity BLE, sustained clonus on R, 3-4 bear L. Bilat quads, hamstring flaccid  MUSCLE LENGTH: Hamstrings: Right -10 deg; Left -10 deg   DTRs:  Patella 3+ =  Brisk  POSTURE: rounded shoulders, forward head, and increased thoracic kyphosis  LOWER EXTREMITY ROM:     Active  Right Eval Left Eval  Hip  flexion 4 0  Hip extension    Hip abduction    Hip adduction    Hip internal rotation    Hip external rotation    Knee flexion 110 110  Knee extension 0 0  Ankle dorsiflexion 0 5  Ankle plantarflexion    Ankle inversion    Ankle eversion     (Blank rows = not tested)  LOWER EXTREMITY MMT:    MMT Right Eval Left Eval  Hip flexion 2- 2-  Hip extension    Hip abduction 2 2  Hip adduction 2 2  Hip internal rotation    Hip external rotation    Knee flexion 2+ 2  Knee extension 4- 3+  Ankle dorsiflexion 0 2  Ankle plantarflexion    Ankle inversion    Ankle eversion    (Blank rows = not tested)  BED MOBILITY:  Reports he uses a rope on the foot board to long sit->sit EOB  TRANSFERS: Sit to stand: Modified independence  Assistive device utilized: Environmental consultant - 4 wheeled     Stand to sit: Modified independence  Assistive device utilized: Environmental consultant - 4 wheeled     Chair to chair: SBA  Assistive device utilized: Environmental consultant - 4 wheeled       RAMP:  Not tested  CURB:  Not tested  STAIRS: Not tested GAIT: Findings: Gait Characteristics: Right foot flat, wide BOS, and poor foot clearance- Left, Distance walked: 25 ft, Level of assistance: SBA and CGA, and Comments:    FUNCTIONAL TESTS:  5 times sit to stand: 31 sec UE use 2 of 5 Timed up and go (TUG): 1 min 49 sec w/ 5TT Romberg: 23 sec eyes open w/ retro-LOB Berg Balance Test: TBD Function In Sitting Test: 45/56                                                                                                                                TREATMENT DATE:     PATIENT EDUCATION: Education details: assessment details Person educated: Patient Education method: Explanation Education comprehension: verbalized understanding  HOME EXERCISE PROGRAM: TBD  GOALS: Goals reviewed with patient? Yes  SHORT TERM GOALS: Target date: 04/06/2024    Patient will be independent in HEP to improve functional outcomes Baseline: Goal  status: MET 04/07/24  2.  Improve unsupported standing x 60 sec to improve ADL participation Baseline: 23 sec w/ retro-LOB;  25 sec with forward LOB 04/07/24  Goal status: NOT MET S 04/07/24   3.  Teach-back strategies/techniques/activities to address forward head/rounded shoulder posture to improve upright posture for enhanced balance Baseline: edu provided today 04/07/24  Goal status: NOT MET 04/07/24     LONG TERM GOALS: Target date: 05/04/2024    Demo improved BLE strength and reduced risk for  falls per time 20 sec 5xSTS test Baseline: 31 sec w/ UE support 2 of 5 reps Goal status:IN PROGRESS  2.  Demo improved mobility and reduced risk for falls per time 75 sec TUG test Baseline: 1 min 49 sec w/ 4WW Goal status: IN PROGRESS  3.  Berg Balance Test TBD Baseline:  Goal status: IN PROGRESS  4.  Ambulation level surfaces x 150 ft w/ modified independence to improve safety and efficiency with household ambulation Baseline: up to 100 ft w/ SBA and 4WW Goal status: IN PROGRESS  5.  Perform car transfers w/ supervision to reduce level of assistance from caregivers  Baseline: min-mod A  Goal status: IN PROGRESS  ASSESSMENT:  CLINICAL IMPRESSION: Pt presents today with reports of one fall since last PT visit; he cancelled last PT visit. Skilled PT session focused on updating HEP to address activation of hip extensors, quads and hamstrings.  Given pt's reports of difficulty/max assist needed for prone and supine positioning, continued to work these muscle groups in sitting and standing.  Brief trial of gait with shoe cover simulating leather toe cap and pt has improved RLE foot clearance and step length.  Recommended he can follow up with orthotist for leather toe cap, as he seemed to like this.   Plan to formally assess goals and discharge next week (pt states he is ready for a break from therapy).   OBJECTIVE IMPAIRMENTS: Abnormal gait, decreased activity tolerance, decreased balance,  decreased coordination, decreased endurance, decreased mobility, difficulty walking, decreased ROM, decreased strength, impaired flexibility, impaired tone, impaired UE functional use, and postural dysfunction.   ACTIVITY LIMITATIONS: carrying, lifting, bending, sitting, standing, squatting, transfers, bed mobility, reach over head, and locomotion level  PARTICIPATION LIMITATIONS: meal prep, cleaning, laundry, interpersonal relationship, community activity, and exercise participation  PERSONAL FACTORS: Age, Past/current experiences, and Time since onset of injury/illness/exacerbation are also affecting patient's functional outcome.   REHAB POTENTIAL: Excellent  CLINICAL DECISION MAKING: Evolving/moderate complexity  EVALUATION COMPLEXITY: Moderate  PLAN:  PT FREQUENCY: 1x/week  PT DURATION: 8 weeks  PLANNED INTERVENTIONS: 97750- Physical Performance Testing, 97110-Therapeutic exercises, 97530- Therapeutic activity, [id number]- Neuromuscular re-education, 97535- Self Care, [id number]- Manual therapy, [id number]- Gait training, [phone]- Orthotic Initial, [id number]- Orthotic/Prosthetic subsequent, [id number]- Aquatic Therapy, [id number]- Electrical stimulation (unattended), and 20560 (1-2 muscles), 20561 (3+ muscles)- Dry Needling  PLAN FOR NEXT SESSION: Need to check goals (recert/discharge) next visit.  Ask if he followed up about leather toe cap  Greig Anon, PT 05/04/24 12:48 PM Phone: [phone] Fax: [phone]  [facility] at [facility] [address] [place], [address] [place], [place] [address] Phone # [phone] Fax # [phone]

## 2024-05-05 ENCOUNTER — Ambulatory Visit: Attending: Neurology | Admitting: Occupational Therapy

## 2024-05-05 ENCOUNTER — Ambulatory Visit: Admitting: Physical Therapy

## 2024-05-05 ENCOUNTER — Encounter: Payer: Self-pay | Admitting: Physical Therapy

## 2024-05-05 DIAGNOSIS — R2689 Other abnormalities of gait and mobility: Secondary | ICD-10-CM | POA: Diagnosis not present

## 2024-05-05 DIAGNOSIS — R2681 Unsteadiness on feet: Secondary | ICD-10-CM | POA: Insufficient documentation

## 2024-05-05 DIAGNOSIS — M6281 Muscle weakness (generalized): Secondary | ICD-10-CM | POA: Diagnosis not present

## 2024-05-05 DIAGNOSIS — R29818 Other symptoms and signs involving the nervous system: Secondary | ICD-10-CM | POA: Diagnosis not present

## 2024-05-05 DIAGNOSIS — R262 Difficulty in walking, not elsewhere classified: Secondary | ICD-10-CM

## 2024-05-05 DIAGNOSIS — R278 Other lack of coordination: Secondary | ICD-10-CM | POA: Insufficient documentation

## 2024-05-05 NOTE — Therapy (Signed)
 OUTPATIENT OCCUPATIONAL THERAPY NEURO  Treatment Note & Discharge  Patient Name: Alexander [name] MRN: [id number] DOB:1976/02/21, 48 y.o., male Today's Date: 05/05/2024  PCP: [name] LABOR, MD REFERRING PROVIDER: Vear Charlie LABOR, MD  OCCUPATIONAL THERAPY DISCHARGE SUMMARY  Visits from Start of Care: 8  Current functional level related to goals / functional outcomes: Pt has made some progress in trunk control and postural control in sitting and standing.  Pt demonstrating increased upright sitting posture and decreased L lean in sitting and standing.     Remaining deficits: Impaired coordination on LUE, impaired strength on RUE, limited hip ROM and stiffness to allow for transitioning in/out of supine to allow for exercises in supine   Education / Equipment: ROM and strengthening exercises, positioning and WB for improved postural control   Patient agrees to discharge. Patient goals were not met. Patient is being discharged due to the patient's request.  Pt reporting having worked with therapy ~3 months now and feeling that he needs a break; would like to restart in the new year.     END OF SESSION:  OT End of Session - 05/05/24 1138     Visit Number 8    Number of Visits 9    Date for Recertification  05/07/24    Authorization Type [employer] Medicare 2025    OT Start Time 1018    OT Stop Time 1100    OT Time Calculation (min) 42 min                 Past Medical History:  Diagnosis Date   Hypertension    Multiple sclerosis sees Dr. 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   Mid back pain 08/13/2018   Transverse myelitis (HCC) 02/19/2018   History of optic neuritis 02/10/2018    Hand weakness 07/10/2016   Spastic gait 03/07/2016   Multiple sclerosis 03/07/2016   Urinary dysfunction 03/07/2016   Tremor 03/07/2016    ONSET DATE: referral date 11/04/23  REFERRING DIAG: G35 (ICD-10-CM) - Multiple sclerosis (HCC) R25.2 (ICD-10-CM) - Spasticity R26.9 (ICD-10-CM) - Gait disturbance M54.9 (ICD-10-CM) - Mid back pain R26.1 (ICD-10-CM) - Spastic gait R53.1 (ICD-10-CM) - Weakness  THERAPY DIAG:  Other symptoms and signs involving the nervous system  Muscle weakness (generalized)  Other lack of coordination  Unsteadiness on feet  Rationale for Evaluation and Treatment: Rehabilitation  SUBJECTIVE:   SUBJECTIVE STATEMENT: Pt reports that he fell after his therapy visits last week.  Pt reporting that he sneezed and fell backwards and was unable to catch/brace himself.  FD came to get him up.  Pt reports pleased with the therapy he has been receiving, but that he wants to take a break and pick things back up in the new year.    Pt accompanied by: family member (mother dropped him off)  PERTINENT HISTORY: 48 y.o. man with a relapsing/active form of secondary progressive multiple sclerosis diagnosed in 2003.   Gait and balance are poor.  He uses a Rolator both inside and outside for short distance but a chair for longer distance.  He has a Chief of Staff wheelchair.  With a Rolator, he can go  > 100 feet.  Has some falls abput 1/month.   He does > 1000 steps a day.    He does worse in the heat.  Left arm is worse still but right hand seems to be catching up with more weakness.   Left > right legs have spasticity, worse at night. He takes baclofen  10 mg prn and CBD daily which helps a little.  Some left tremors.  Higher dose of baclofen  caused weakness.  He has poor coordination, left > right. Reports secondary progressive type MS   PRECAUTIONS: Fall  WEIGHT BEARING RESTRICTIONS: No  PAIN:  Are you having pain? Yes: NPRS scale: 2/10 Pain location: torso, hips, low  back, hamstrings Pain description: aching, sore Aggravating factors: movement Relieving factors: less movement  FALLS: Has patient fallen in last 6 months? Yes. Number of falls 3, requiring fire department to assist  LIVING ENVIRONMENT: Lives with: lives with their family Lives in: House/apartment Stairs: ramp entrance Has following equipment at home: Vannie - 2 wheeled, Environmental consultant - 4 wheeled, Wheelchair (power), shower chair, and Grab bars  PLOF: Independent with basic ADLs, Independent with household mobility with device, and Requires assistive device for independence  PATIENT GOALS: improved posture, functional use of arms and hands  OBJECTIVE:  Note: Objective measures were completed at Evaluation unless otherwise noted.  HAND DOMINANCE: Right  ADLs: Overall ADLs: I have adapted Transfers/ambulation related to ADLs: uses Rollator for short distances in the home and hand walks into bathroom holding on to counters and railing in bathroom Eating: difficulty with opening containers, scooping foods Grooming: spasticity in arms impacts ease with brushing teeth UB Dressing: Mod I, every once in awhile has some difficulty getting shirt on LB Dressing: Mod I Toileting: uses railing in bathroom and hand walking to get in and around bathroom Bathing: Mod I Tub Shower transfers: Mod I Equipment: Shower seat with back, Grab bars, and Walk in shower  IADLs: Shopping: mother performs Light housekeeping: mother performs Meal Prep: mother performs Community mobility: mother provides transportation Medication management: Mod I Financial management: Mod I Handwriting: reports he rarely writes anymore  MOBILITY STATUS: Needs Assist: Mod I stand pivot transfers with Rollator, Supervision short distance ambulation with Rollator  POSTURE COMMENTS:  rounded shoulders, forward head, increased thoracic kyphosis, and weight shift left  ACTIVITY TOLERANCE: Activity tolerance: requires a  couple days to recover from therapy appts  FUNCTIONAL OUTCOME MEASURES: PSFS   05/05/24   UPPER EXTREMITY ROM:    Active ROM Right eval Left eval Right 05/05/24 Left 05/05/24  Shoulder flexion 114 130 110 138  Shoulder abduction 90 100 90 100  Shoulder adduction      Shoulder extension      Shoulder internal rotation      Shoulder external rotation      Elbow flexion [facility] WFL    Elbow extension [facility] WFL    Wrist flexion [facility] WFL    Wrist extension [facility] WFL    Wrist ulnar deviation      Wrist radial deviation      Wrist pronation      Wrist supination      (Blank rows = not tested)  UPPER EXTREMITY MMT:   increased spasticity noted in L with attempts at MMT  MMT Right eval Left eval Right 05/05/24 Left 05/05/24  Shoulder flexion 3 4- 3- 4-  Shoulder abduction 3+ 4 3 4-  Shoulder adduction      Shoulder extension      Shoulder internal rotation  Shoulder external rotation      Middle trapezius      Lower trapezius      Elbow flexion 4 4 3- 4  Elbow extension 4 4 3- 4  Wrist flexion 3+ 4-    Wrist extension 3+ 4-    Wrist ulnar deviation      Wrist radial deviation      Wrist pronation      Wrist supination      (Blank rows = not tested)  HAND FUNCTION: Grip strength: Right: 40 (40, 36, and 46) lbs; Left: 65 lbs  COORDINATION: 9 Hole Peg test: Right: 2:03 (required 1:35 to place 9 pegs); Left: 2:35 (1:57 to place pegs) Box and Blocks:  Right 25 blocks, Left 18 blocks  SENSATION: Decreased sensation bilaterally, impacting ease with picking up items. I couldn't feel what I was touching.  MUSCLE TONE: increased spasticity LUE > RUE  COGNITION: Overall cognitive status: Within functional limits for tasks assessed  VISION: Subjective report: optic neuritis in the R eye, I have lost vision in the R eye Baseline vision: Wears glasses all the time                                                                                                                              TREATMENT DATE:  05/05/24 Shoulder ROM: completing scapular retraction and horizontal abduction to R and then to L with red theraband due to decreased bilateral ROM.  OT providing verbal cues for upright posture and technique.  OT encouraging pt to complete horizontal exercises unilaterally to allow for focus and attention to postural control.  Pt reporting R side feeling weaker today compared to when completing exercises at home this week. Gross motor:  engaged in WB through UE and reaching across midline to retreive small rings to facilitate increased WB and weight shifting.  Pt WB through LUE initially while reaching with RUE with good lateral pinch to obtain rings and then placing at midline.  OT increasing distance of targets to facilitate increased ROM and reach.  Transitioned to reaching with LUE.  Pt demonstrating good reach, however demonstrating tremors due to spasticity but still able to retreive rings with increased time/effort.   Measurements: assessed ROM and strength (see above for details) with pt demonstrating mild decreased R shoulder ROM and decreased strength bilaterally this session.  Pt reports feeling weaker today.   Box and blocks: Left: 20 blocks, Right: 26 blocks     04/28/24 Gross motor control: reaching out to side for shoulder abduction and weight shifting to retrieve a clothespins and then place onto vertical dowel to facilitate shoulder flexion and upright posture.  Pt demonstrating decreased motor control when reaching overhead > reaching out to side with LUE.  Completed on R, pt with decreased abduction and weight shift to R and into flexion, however more controlled in movement.  Utilizing 4 and 6# resistance clothespins with LUE and 4# with RUE due to  decreased grip strength. Shoulder ROM: Utilizing red theraband completing scapular retraction and horizontal abduction to R and then to L due to decreased bilateral ROM.  OT providing tactile cues at back,  between and below scapula for upright posture and technique.  OT encouraging pt to complete horizontal exercises unilaterally to allow for focus and attention to postural control and support as pt with decreased postural control and rounding of back when attempting BUE together.      04/14/24 Gross motor control: engaged in picking up small rings with good lateral pinch with RUE and moving them to place onto vertical targets.  OT increasing distance of targets to facilitate increased ROM and reach.  OT providing instruction to WB through LUE during reaching for added NMR of WB as well as improved upright posture and trunk control.  Transitioned to reaching with LUE.  Pt demonstrating good reach, however demonstrating tremors due to spasticity.  Engaged in reaching for cones incorporating increased weight shifting with reaching further outside BOS and across midline.  Pt again WB through LUE while reaching with RUE and then transitioned to reaching with LUE.  Pt initially knocking over cones from lower surfaces on L with LUE, however with cues to focus on weight shifting and hand opening, pt with increased motor control.  OT providing intermittent tactile cue at torso to facilitate elongation and upright posture. Box and blocks: Right: 27 blocks, Left: 18 blocks Pt asking questions about methods to decrease tremors.  Pt educating on WB, focus on proximal and core strength, and potential benefit of weight or compression, and/or medication.  Encouraged pt to f/u with MD if desire to pursue anything medically.   PATIENT EDUCATION: Education details: educated on functional tasks to carryover weight bearing, weight shifting and postural control at home Person educated: Patient Education method: Explanation, Demonstration, and Verbal cues Education comprehension: verbalized understanding and needs further education  HOME EXERCISE PROGRAM: TBD   GOALS: Goals reviewed with patient? Yes  SHORT TERM  GOALS: Target date: 04/09/24  Pt will be independent with gross and fine motor control HEP. Baseline: new to OPOT Goal status: in progress  2.  Pt will demonstrate improved UE functional use for ADLs as evidenced by increasing box/ blocks score by 4 blocks with BUE Baseline: Right 25 blocks, Left 18 blocks 04/14/24: R: 27 blocks, Left: 18 blocks Goal status: in progress  3.  Pt will verbalize understanding of energy conservation techniques.  Baseline: new to OPOT Goal status: in progress  4.  Pt will verbalize understanding of task modifications and/or potential DME / AE needs to increase ease, safety, and independence w/ ADLs. Baseline:  Goal status: in progress  5.  Pt will verbalize understanding of safety concerns and precautions due to impaired sensation in BUE. Baseline: impaired sensation Goal status: in progress    LONG TERM GOALS: Target date: 05/07/24  Pt will demonstrate improved UE functional use for ADLs as evidenced by increasing box/ blocks score by 6 blocks with BUE Baseline: Right 25 blocks, Left 18 blocks 05/05/24: Left: 20 blocks, Right: 26 blocks Goal status: Not met  2.  Pt will demonstrate improved trunk/postural control with ability to shift in and out of midline and recognize when losing balance during seated or standing ADLs. Baseline: Left lean in unsupported position Goal status: MET  3.   Pt will demonstrate improved grip strength by 5# on R to increase ease with opening grooming and food containers. Baseline: R: 40# 05/05/24: R: 40# and L:  52# Goal status: Not met  4.  Pt will perform dynamic standing task for 5 mins for simple IADLS w/o LOB using DME and/or countertop support prn Baseline: Right lean in standing Goal status: Not met  5.  Pt will demonstrate improved shoulder flexion on RUE by 10# to allow for increased reach and strength to remove light-moderate weight items as needed for ADLs and IADLs.  Baseline: RUE 3- to 4 05/05/24: RUE weaker  today than upon eval Goal status: Not met  6.  Patient will report at least two-point increase in average PSFS score or at least three-point increase in a single activity score indicating functionally significant improvement given minimum detectable change. Baseline: 4.3 05/05/24: 4.0  Goal status: Not met  ASSESSMENT:  CLINICAL IMPRESSION: Patient is a 48 y.o. male who was seen today for occupational therapy treatment session for weakness and spasticity due to multiple sclerosis. Pt demonstrating ongoing posterior weight shift and rounded shoulders, however demonstrating improved ability to achieve upright sitting posture.  Pt demonstrating good recall of theraband exercises for UB strengthening and postural control. Pt demonstrating decreased ROM and strength this session, reporting that he has not slept well the last few days.  Pt reports pleased with therapy services provided to this point, however is feeling that he needs to take a break from therapy for a few months with desire to resume in the new year.   PERFORMANCE DEFICITS: in functional skills including ADLs, IADLs, coordination, sensation, ROM, strength, flexibility, Fine motor control, Gross motor control, balance, body mechanics, endurance, decreased knowledge of precautions, decreased knowledge of use of DME, and UE functional use and psychosocial skills including coping strategies, environmental adaptation, and routines and behaviors.    PLAN:  OT FREQUENCY: 1x/week  OT DURATION: 8 weeks  PLANNED INTERVENTIONS: 97168 OT Re-evaluation, 97535 self care/ADL training, 02889 therapeutic exercise, 97530 therapeutic activity, 97112 neuromuscular re-education, 97140 manual therapy, 97113 aquatic therapy, 97035 ultrasound, balance training, functional mobility training, psychosocial skills training, energy conservation, coping strategies training, patient/family education, and DME and/or AE instructions  RECOMMENDED OTHER SERVICES:  NA  CONSULTED AND AGREED WITH PLAN OF CARE: Patient    KAYLENE DOMINO, OTR/L 05/05/2024, 11:38 AM  [facility] at [facility] [address] [place], [place] [address] Phone # [phone] Fax # [phone]

## 2024-05-05 NOTE — Addendum Note (Signed)
 Addended by: CAMPBELL LOUANA POUR on: 05/05/2024 12:01 PM   Modules accepted: Orders

## 2024-05-27 ENCOUNTER — Ambulatory Visit: Admitting: Family Medicine

## 2024-06-10 ENCOUNTER — Ambulatory Visit: Admitting: Family Medicine

## 2024-06-10 DIAGNOSIS — Z0000 Encounter for general adult medical examination without abnormal findings: Secondary | ICD-10-CM

## 2024-06-10 NOTE — Patient Instructions (Signed)
 I really enjoyed getting to talk with you today! I am available on Tuesdays and Thursdays for virtual visits if you have any questions or concerns, or if I can be of any further assistance.   CHECKLIST FROM ANNUAL WELLNESS VISIT:  -Follow up (please call to schedule if not scheduled after visit):   -yearly for annual wellness visit with primary care office  Here is a list of your preventive care/health maintenance measures and the plan for each if any are due:  PLAN For any measures below that may be due:   Please let us  know if you change your mind about colon cancer screening or vaccines.   Health Maintenance  Topic Date Due   Hepatitis B Vaccines 19-59 Average Risk (1 of 3 - 19+ 3-dose series) Never done   DTaP/Tdap/Td (1 - Tdap) 08/04/2024 (Originally 07/09/1993)   COVID-19 Vaccine (1) 08/04/2024 (Originally 07/10/1979)   Influenza Vaccine  11/02/2024 (Originally 03/05/2024)   Colonoscopy  07/04/2025 (Originally 07/10/2019)   Medicare Annual Wellness (AWV)  06/10/2025   Hepatitis C Screening  Completed   HIV Screening  Completed   Pneumococcal Vaccine  Aged Out   HPV VACCINES  Aged Out   Meningococcal B Vaccine  Aged Out    -See a dentist at least yearly  -Get your eyes checked and then per your eye specialist's recommendations   -I have included below further information regarding a healthy whole foods based diet, physical activity guidelines for adults, stress management and opportunities for social connections. I hope you find this information useful.   -----------------------------------------------------------------------------------------------------------------------------------------------------------------------------------------------------------------------------------------------------------    NUTRITION: -eat real food: lots of colorful vegetables (half the plate) and fruits -5-7 servings of vegetables and fruits per day (fresh or steamed is best), exp. 2  servings of vegetables with lunch and dinner and 2 servings of fruit per day. Berries and greens such as kale and collards are great choices.  -consume on a regular basis:  fresh fruits, fresh veggies, fish, nuts, seeds, healthy oils (such as olive oil, avocado oil), whole grains (make sure for bread/pasta/crackers/etc., that the first ingredient on label contains the word whole), legumes. -can eat small amounts of dairy and lean meat (no larger than the palm of your hand), but avoid processed meats such as ham, bacon, lunch meat, etc. -drink water -try to avoid fast food and pre-packaged foods, processed meat, ultra processed foods/beverages (donuts, candy, etc.) -most experts advise limiting sodium to < 2300mg  per day, should limit further is any chronic conditions such as high blood pressure, heart disease, diabetes, etc. The American Heart Association advised that < 1500mg  is is ideal -try to avoid foods/beverages that contain any ingredients with names you do not recognize  -try to avoid foods/beverages  with added sugar or sweeteners/sweets  -try to avoid sweet drinks (including diet drinks): soda, juice, Gatorade, sweet tea, power drinks, diet drinks -try to avoid white rice, white bread, pasta (unless whole grain)  EXERCISE GUIDELINES FOR ADULTS: -if you wish to increase your physical activity, do so gradually and with the approval of your doctor -STOP and seek medical care immediately if you have any chest pain, chest discomfort or trouble breathing when starting or increasing exercise  -move and stretch your body, legs, feet and arms when sitting for long periods -Physical activity guidelines for optimal health in adults: -get at least 150 minutes per week of moderate exercise (can talk, but not sing); this is about 20-30 minutes of sustained activity 5-7 days per week or two  10-15 minute episodes of sustained activity 5-7 days per week -do some muscle building/resistance  training/strength training at least 2 days per week  -balance exercises 3+ days per week:    STRESS MANAGEMENT: -can try meditating, or just sitting quietly with deep breathing while intentionally relaxing all parts of your body for 5 minutes daily -if you need further help with stress, anxiety or depression please follow up with your primary doctor or contact the wonderful folks at [facility]: [phone]

## 2024-06-10 NOTE — Progress Notes (Signed)
 Question 06/06/2024 11:16 AM EST - Filed by Patient  Please indicate how much help you need (if any) to perform everyday activities? Needs assistance  Are you having any difficulty walking, taking medications on your own, and or difficulty managing daily home needs? Needs Assist Abnormal   What level of assistance (if any) do you need to walk from room to room? Independent with device- listed below  How much assistance do you need to manage your medications? Needs assistance  How much assistance (if any) do you need in managing your home? Needs assistance (comment)  Are you deaf or have significant trouble hearing? No  Do you have difficulty seeing, even when wearing glasses or contact lenses? No  Do you have difficulty concentrating, remembering, or making decisions? No  Do you have difficulty walking or climbing stairs? Yes  Do you have difficulty dressing or bathing? Yes  Do you have difficulty doing errands alone such as visiting a doctor's office or shopping? Yes  Do you currently have any difficulty preparing food and eating? Yes  Do you currently have any difficulty using the toilet? No  In the past six months, have you accidentally leaked urine? Yes  Do you have any problems with loss of bowel control? No  Do you have any difficulty managing your medications? No  Do you have any difficulty managing your finances? No  Do you have any difficulties with housekeeping of managing your housekeeping? Yes  Have you had a fall in the past 12 months? Yes  How many falls have you had in the past year? 2 or more  Were you injured when you fell? No  Do you have any stairs in or around your home? No  If you have stairs, are there any without handrails? No  Is your home free of loose throw rugs in walkways, pet beds, electrical cords etc.? No  Do you have adequate lighting in your home to reduce risk of falls? Yes  Do you use Life Alert in case of a fall? Yes  Do you use a cane, walker or  wheelchair? Yes  Do you have grab bars installed in your bathroom(s)? Yes  Do you have a shower chair or bench in your shower? Yes  Do you have an elevated toilet seat or a handicapped toilet? Yes   Chl Mychart Sd[facility]  Question 06/06/2024 11:22 AM EST - Filed by Patient 03/16/2024  5:50 PM EST - Filed by Patient  Within the past 12 months, have you ever stayed: outside, in a car, in a tent, in an overnight shelter, or temporarily in someone else's home (i.e. couch-surfing)?  No  Do you have problems with pests (bugs, ants, mice), mold, lead and/or water leaks at the place where you stay?  No  On average, how many days per week do you engage in moderate to strenuous exercise (like a brisk walk)? 5 days 0 days  On average, how many minutes do you engage in exercise at this level? 60 min   Do you feel stress - tense, restless, nervous, or anxious, or unable to sleep at night because your mind is troubled all the time - these days? Only a little Only a little  Do you belong to any clubs or organizations such as church groups, unions, fraternal or athletic groups, or school groups? Yes Patient declined  How often do you attend meetings of the clubs or organizations you belong to? More than 4 times per year   In  a typical week, how many times do you talk on the phone with family, friends, or neighbors? Twice a week Twice a week  How often do you get together with friends or relatives? Twice a week Once a week  How often do you attend church or religious services? 1 to 4 times per year 1 to 4 times per year  Are you married, widowed, divorced, separated, never married, or living with a partner? Divorced Divorced  How hard is it for you to pay for the very basics like food, housing, medical care, and heating? Somewhat hard Patient declined  Within the past 12 months, you worried that your food would run out before you got the money to buy more. Never true Patient declined  Within the past 12 months, the  food you bought just didn't last and you didn't have money to get more. Never true Patient declined  What is the highest level of school you have completed or the highest degree you have received? Bachelor's degree (e.g., BA, AB, BS) Bachelor's degree (e.g., BA, AB, BS)  In the past 12 months, has lack of transportation kept you from medical appointments or from getting medications? No No  In the past 12 months, has lack of transportation kept you from meetings, work, or from getting things needed for daily living? Yes No    ----------------------------------------------------------------------------------------------------------------------------------------------------------------------------------------------------------------------  Because this visit was a virtual/telehealth visit, some criteria may be missing or patient reported. Any vitals not documented were not able to be obtained and vitals that have been documented are patient reported.    MEDICARE ANNUAL PREVENTIVE CARE VISIT WITH PROVIDER (Welcome to Medicare, initial annual wellness or annual wellness exam)  Virtual Visit via Video Note  I connected with Alexander [name] on 06/10/24  by a video enabled telemedicine application and verified that I am speaking with the correct person using two identifiers.  Location patient: home Location provider:work or home office Persons participating in the virtual visit: patient, provider  Concerns and/or follow up today: nothing new. Finished up some ot/PT recently. Reports mother cares for him, he stays busy and makes the best of things. In [place] support groups. Detailed intake and health/risks assessment completed on flow sheets and below- please see for details.   How often do you have a drink containing alcohol?never How many drinks containing alcohol do you have on a typical day when you are drinking?na How often do you have six or more drinks on one occasion?na Have you ever smoked?n Quit  date if applicable? n  How many packs a day do/did you smoke? na Do you use smokeless tobacco?na Do you use an illicit drugs?na Do you feel safe at home?yes Last dentist visit?goes yearly Last eye Exam and location?Dr. Debarah, goes once a year   See HM section in Epic for other details of completed HM.    ROS: negative for report of fevers, unintentional weight loss, vision changes, vision loss, hearing loss or change, chest pain, sob, hemoptysis, melena, hematochezia, hematuria, falls, bleeding or bruising, thoughts of suicide or self harm, memory loss  Patient-completed extensive health risk assessment - reviewed and discussed with the patient: See Health Risk Assessment completed with patient prior to the visit either above or in recent phone note. This was reviewed in detailed with the patient today and appropriate recommendations, orders and referrals were placed as needed per Summary below and patient instructions.   Review of Medical History: -PMH, PSH, Family History and current specialty and care providers  reviewed and updated and listed below   Patient Care Team: [name] LABOR, MD as PCP - General (Family Medicine) Dr. Debarah as Attending Physician (Ophthalmology)   Past Medical History:  Diagnosis Date   Hypertension    Multiple sclerosis sees Dr. Charlie Crete   Vision abnormalities     Past Surgical History:  Procedure Laterality Date   ACHILLES TENDON SURGERY     ANTERIOR CRUCIATE LIGAMENT REPAIR     MENISCUS REPAIR      Social History   Socioeconomic History   Marital status: Divorced    Spouse name: Not on file   Number of children: Not on file   Years of education: Not on file   Highest education level: Bachelor's degree (e.g., BA, AB, BS)  Occupational History   Not on file  Tobacco Use   Smoking status: Never   Smokeless tobacco: Never  Vaping Use   Vaping status: Never Used  Substance and Sexual Activity   Alcohol use: No   Drug use: No    Sexual activity: Not on file  Other Topics Concern   Not on file  Social History Narrative   Not on file   Social Drivers of Health   Financial Resource Strain: Medium Risk (06/06/2024)   Overall Financial Resource Strain (CARDIA)    Difficulty of Paying Living Expenses: Somewhat hard  Food Insecurity: No Food Insecurity (06/06/2024)   Hunger Vital Sign    Worried About Running Out of Food in the Last Year: Never true    Ran Out of Food in the Last Year: Never true  Transportation Needs: Unmet Transportation Needs (06/06/2024)   PRAPARE - Transportation    Lack of Transportation (Medical): No    Lack of Transportation (Non-Medical): Yes  Physical Activity: Sufficiently Active (06/06/2024)   Exercise Vital Sign    Days of Exercise per Week: 5 days    Minutes of Exercise per Session: 60 min  Recent Concern: Physical Activity - Inactive (03/16/2024)   Exercise Vital Sign    Days of Exercise per Week: 0 days    Minutes of Exercise per Session: Not on file  Stress: No Stress Concern Present (06/06/2024)   [employer] of Occupational Health - Occupational Stress Questionnaire    Feeling of Stress: Only a little  Social Connections: Moderately Integrated (06/06/2024)   Social Connection and Isolation Panel    Frequency of Communication with Friends and Family: Twice a week    Frequency of Social Gatherings with Friends and Family: Twice a week    Attends Religious Services: 1 to 4 times per year    Active Member of [employer] or Organizations: Yes    Attends Engineer, Structural: More than 4 times per year    Marital Status: Divorced  Intimate Partner Violence: Not At Risk (06/11/2022)   Humiliation, Afraid, Rape, and Kick questionnaire    Fear of Current or Ex-Partner: No    Emotionally Abused: No    Physically Abused: No    Sexually Abused: No    Family History  Problem Relation Age of Onset   Congestive Heart Failure Father    Stroke Maternal Grandfather    Multiple  sclerosis Sister     Current Outpatient Medications on File Prior to Visit  Medication Sig Dispense Refill   Ascorbic Acid  (VITA-C PO) Take 1,000 mg by mouth daily.     baclofen  (LIORESAL ) 10 MG tablet Take 10mg  three times daily as needed     Cholecalciferol  (DIALYVITE  VITAMIN D  5000) 125 MCG (5000 UT) capsule Take 5,000 Units by mouth daily.     Cyanocobalamin  (B-12 PO) Take 1 Dose by mouth every other day.     MAGNESIUM PO Take 500 mg by mouth daily.     Misc Natural Products (NF FORMULAS TESTOSTERONE ) CAPS Take 165 mg by mouth daily.     Nutritional Supplements (JUICE PLUS FIBRE PO) Take 1 capsule by mouth daily.     Omega-3 Fatty Acids (FISH OIL) 1000 MG CAPS Take 1,200 mg by mouth daily.     Probiotic Product (PROBIOTIC PO) Take 3 capsules by mouth daily. Nature Made     Turmeric 500 MG TABS Take 1 Dose by mouth every other day.     UNABLE TO FIND Take 500-1,000 mg by mouth daily. CBD oil     UNABLE TO FIND Take 21 g by mouth. Med Name: Vegan protein powder     UNABLE TO FIND Med Name: [facility] mushroom powder     No current facility-administered medications on file prior to visit.    No Known Allergies     Physical Exam Vitals requested from patient and listed below if patient had equipment and was able to obtain at home for this virtual visit: There were no vitals filed for this visit. Estimated body mass index is 24.33 kg/m as calculated from the following:   Height as of 02/11/24: 5' 8 (1.727 m).   Weight as of 03/17/24: 160 lb (72.6 kg).  EKG (optional): deferred due to virtual visit  GENERAL: alert, oriented, no acute distress detected; full vision exam deferred due to pandemic and/or virtual encounter  HEENT: atraumatic, conjunttiva clear, no obvious abnormalities on inspection of external nose and ears  NECK: normal movements of the head and neck  LUNGS: on inspection no signs of respiratory distress, breathing rate appears normal, no obvious gross SOB, gasping  or wheezing  CV: no obvious cyanosis  MS: moves all visible extremities without noticeable abnormality  PSYCH/NEURO: pleasant and cooperative, no obvious depression or anxiety, speech and thought processing grossly intact, Cognitive function grossly intact  Flowsheet Row Clinical Support from 06/11/2022 in [facility] [place]  PHQ-9 Total Score 0        06/10/2024    3:16 PM 06/11/2022    2:47 PM 06/03/2022    9:44 AM 06/06/2021    1:54 PM 06/06/2021    1:51 PM  Depression screen PHQ 2/9  Decreased Interest 0 0 1 0 0  Down, Depressed, Hopeless 0 0 1 0 0  PHQ - 2 Score 0 0 2 0 0  Altered sleeping  0 1    Tired, decreased energy  0 0    Change in appetite  0 1    Feeling bad or failure about yourself   0 0    Trouble concentrating  0 1    Moving slowly or fidgety/restless  0 0    PHQ-9 Score  0  5        Data saved with a previous flowsheet row definition       08/06/2022    7:46 AM 06/14/2023   10:38 AM 06/17/2023    4:07 PM 06/06/2024   11:16 AM 06/10/2024    2:58 PM  Fall Risk  Falls in the past year?  1 1 1    Was there an injury with Fall?  0 0 0 0  Fall Risk Category Calculator  2  2 2     (  RETIRED) Patient Fall Risk Level Moderate fall risk       Patient at Risk for Falls Due to   History of fall(s)  Impaired balance/gait;History of fall(s)  Fall risk Follow up     Falls evaluation completed;Education provided     Patient-reported   Data saved with a previous flowsheet row definition     SUMMARY AND PLAN:  Encounter for Medicare annual wellness exam   Discussed applicable health maintenance/preventive health measures and advised and referred or ordered per patient preferences: -he declined vaccines and colon cancer screening Health Maintenance  Topic Date Due   Hepatitis B Vaccines 19-59 Average Risk (1 of 3 - 19+ 3-dose series) Never done   DTaP/Tdap/Td (1 - Tdap) 08/04/2024 (Originally 07/09/1993)   COVID-19 Vaccine (1) 08/04/2024  (Originally 07/10/1979)   Influenza Vaccine  11/02/2024 (Originally 03/05/2024)   Colonoscopy  07/04/2025 (Originally 07/10/2019)   Medicare Annual Wellness (AWV)  06/10/2025   Hepatitis C Screening  Completed   HIV Screening  Completed   Pneumococcal Vaccine  Aged Out   HPV VACCINES  Aged Out   Meningococcal B Vaccine  Aged Out     Education and counseling on the following was provided based on the above review of health and a plan/checklist for the patient, along with additional information discussed, was provided for the patient in the patient instructions :  -Requested he bring copies of advanced directives to Dr. Johnny -Discussed full prevention and safe balance exercises. -Advised and counseled on a healthy lifestyle - including the importance of a healthy diet, regular physical activity, social connections and stress management. -Reviewed patient's current diet. Advised and counseled on a whole foods based healthy diet. Congratulated on healthy choices. A summary of a healthy diet was provided in the Patient Instructions.  -reviewed patient's current physical activity level and discussed exercise guidelines for adults.Congratulated on healthy choices.   -Advise yearly dental visits at minimum and regular eye exams   Follow up: see patient instructions   Patient Instructions  I really enjoyed getting to talk with you today! I am available on Tuesdays and Thursdays for virtual visits if you have any questions or concerns, or if I can be of any further assistance.   CHECKLIST FROM ANNUAL WELLNESS VISIT:  -Follow up (please call to schedule if not scheduled after visit):   -yearly for annual wellness visit with primary care office  Here is a list of your preventive care/health maintenance measures and the plan for each if any are due:  PLAN For any measures below that may be due:   Please let us  know if you change your mind about colon cancer screening or vaccines.   Health  Maintenance  Topic Date Due   Hepatitis B Vaccines 19-59 Average Risk (1 of 3 - 19+ 3-dose series) Never done   DTaP/Tdap/Td (1 - Tdap) 08/04/2024 (Originally 07/09/1993)   COVID-19 Vaccine (1) 08/04/2024 (Originally 07/10/1979)   Influenza Vaccine  11/02/2024 (Originally 03/05/2024)   Colonoscopy  07/04/2025 (Originally 07/10/2019)   Medicare Annual Wellness (AWV)  06/10/2025   Hepatitis C Screening  Completed   HIV Screening  Completed   Pneumococcal Vaccine  Aged Out   HPV VACCINES  Aged Out   Meningococcal B Vaccine  Aged Out    -See a dentist at least yearly  -Get your eyes checked and then per your eye specialist's recommendations   -I have included below further information regarding a healthy whole foods based diet, physical activity guidelines for  adults, stress management and opportunities for social connections. I hope you find this information useful.   -----------------------------------------------------------------------------------------------------------------------------------------------------------------------------------------------------------------------------------------------------------    NUTRITION: -eat real food: lots of colorful vegetables (half the plate) and fruits -5-7 servings of vegetables and fruits per day (fresh or steamed is best), exp. 2 servings of vegetables with lunch and dinner and 2 servings of fruit per day. Berries and greens such as kale and collards are great choices.  -consume on a regular basis:  fresh fruits, fresh veggies, fish, nuts, seeds, healthy oils (such as olive oil, avocado oil), whole grains (make sure for bread/pasta/crackers/etc., that the first ingredient on label contains the word whole), legumes. -can eat small amounts of dairy and lean meat (no larger than the palm of your hand), but avoid processed meats such as ham, bacon, lunch meat, etc. -drink water -try to avoid fast food and pre-packaged foods, processed meat,  ultra processed foods/beverages (donuts, candy, etc.) -most experts advise limiting sodium to < 2300mg  per day, should limit further is any chronic conditions such as high blood pressure, heart disease, diabetes, etc. The American Heart Association advised that < 1500mg  is is ideal -try to avoid foods/beverages that contain any ingredients with names you do not recognize  -try to avoid foods/beverages  with added sugar or sweeteners/sweets  -try to avoid sweet drinks (including diet drinks): soda, juice, Gatorade, sweet tea, power drinks, diet drinks -try to avoid white rice, white bread, pasta (unless whole grain)  EXERCISE GUIDELINES FOR ADULTS: -if you wish to increase your physical activity, do so gradually and with the approval of your doctor -STOP and seek medical care immediately if you have any chest pain, chest discomfort or trouble breathing when starting or increasing exercise  -move and stretch your body, legs, feet and arms when sitting for long periods -Physical activity guidelines for optimal health in adults: -get at least 150 minutes per week of moderate exercise (can talk, but not sing); this is about 20-30 minutes of sustained activity 5-7 days per week or two 10-15 minute episodes of sustained activity 5-7 days per week -do some muscle building/resistance training/strength training at least 2 days per week  -balance exercises 3+ days per week:    STRESS MANAGEMENT: -can try meditating, or just sitting quietly with deep breathing while intentionally relaxing all parts of your body for 5 minutes daily -if you need further help with stress, anxiety or depression please follow up with your primary doctor or contact the wonderful folks at [facility]: [phone]                Chiquita JONELLE Cramp, DO

## 2024-07-30 ENCOUNTER — Telehealth: Payer: Self-pay

## 2024-07-30 NOTE — Progress Notes (Signed)
" ° °  07/30/2024  Patient ID: Alexander [name], male   DOB: 24-Feb-1976, 48 y.o.   MRN: [id number]  Pharmacy Quality Measure Review  This patient is appearing on a report for being at risk of failing the Controlling Blood Pressure measure this calendar year.   Last documented BP 142/90 on 03/17/24  Spoke with patient via phone. He checked BP today and it was 148/93. Started checking because he had a fall the other day and EMS assisted him in getting back up and checked out. Noted with him that his BP was elevated. Patient will check daily for the next few days and keep log. Sch f/u for 08/04/24.  Jon VEAR Lindau, PharmD Clinical Pharmacist [phone]   "

## 2024-08-02 ENCOUNTER — Encounter: Payer: Self-pay | Admitting: Neurology

## 2024-08-04 ENCOUNTER — Other Ambulatory Visit

## 2024-08-04 VITALS — BP 148/88

## 2024-08-04 DIAGNOSIS — I10 Essential (primary) hypertension: Secondary | ICD-10-CM

## 2024-08-04 DIAGNOSIS — Z79899 Other long term (current) drug therapy: Secondary | ICD-10-CM

## 2024-08-04 NOTE — Progress Notes (Signed)
" ° °  08/04/2024  Patient ID: Alexander [name], male   DOB: [birth year], 48 y.o.   MRN: [id number]  Pharmacy Quality Measure Review  This patient is appearing on a report for being at risk of failing the Controlling Blood Pressure measure this calendar year.   Last documented BP 148/88 08/04/24  Spoke with patient via phone. He checked BP daily the last 5 days and all readings >140 for SBP. Most recent reading 148/88. Other DBP readings >90. None >/=100  Due for f/u with PCP. Recommend patient check BP routinely at home, at least once weekly and keep a log. Bring to PCP visit next month. If continuously elevated, need to consider addition of medication for BP management. Counseled on lifestyle modifications to help lower BP. Patient expressed understanding.  Jon VEAR Lindau, PharmD Clinical Pharmacist [phone]   "

## 2024-08-24 ENCOUNTER — Ambulatory Visit: Payer: Self-pay

## 2024-08-24 ENCOUNTER — Ambulatory Visit: Admitting: Family Medicine

## 2024-08-24 NOTE — Telephone Encounter (Signed)
 Bilat pedal swelling x 1-2 months  FYI Only or Action Required?: FYI only for provider: appointment scheduled on 1/22.  Patient was last seen in primary care on 06/10/2024 by Luke Chiquita SAUNDERS, DO.  Called Nurse Triage reporting Foot Swelling.  Symptoms began about a month ago.  Interventions attempted: Nothing.  Symptoms are: gradually worsening.  Triage Disposition: See PCP When Office is Open (Within 3 Days)  Patient/caregiver understands and will follow disposition?: Yes   Swelling in his feet- DT denial - Pt is calling to reschedule his apportionment, however he does not want a BP follow up he would like an appointment to have a physical done.  Pt has been advised his current appointment was for BP follow up and the physical may be further out. Pt is aware.  Reason for Disposition  [1] MILD swelling of both ankles (i.e., pedal edema) AND [2] new-onset or getting worse  Answer Assessment - Initial Assessment Questions 1. ONSET: When did the swelling start? (e.g., minutes, hours, days)     1-2 months ago  2. LOCATION: What part of the leg is swollen?  Are both legs swollen or just one leg?     Both feet  3. SEVERITY: How bad is the swelling? (e.g., localized; mild, moderate, severe)     Ankle and feet are swelling  4. REDNESS: Is there redness or signs of infection?     Red  5. PAIN: Is the swelling painful to touch? If Yes, ask: How painful is it?   (Scale 1-10; mild, moderate or severe)     0/10  6. FEVER: Do you have a fever? If Yes, ask: What is it, how was it measured, and when did it start?      Denies  7. CAUSE: What do you think is causing the leg swelling?     Unsure  8. MEDICAL HISTORY: Do you have a history of blood clots (e.g., DVT), cancer, heart failure, kidney disease, or liver failure?     MS  9. RECURRENT SYMPTOM: Have you had leg swelling before? If Yes, ask: When was the last time? What happened that time?     Denies  10.  OTHER SYMPTOMS: Do you have any other symptoms? (e.g., chest pain, difficulty breathing)       Denies  Protocols used: Leg Swelling and Edema-A-AH

## 2024-08-26 ENCOUNTER — Encounter: Payer: Self-pay | Admitting: Family Medicine

## 2024-08-26 ENCOUNTER — Ambulatory Visit: Admitting: Family Medicine

## 2024-08-26 VITALS — BP 138/84 | HR 76 | Temp 97.9°F | Wt 160.0 lb

## 2024-08-26 DIAGNOSIS — G35D Multiple sclerosis, unspecified: Secondary | ICD-10-CM | POA: Diagnosis not present

## 2024-08-26 DIAGNOSIS — E785 Hyperlipidemia, unspecified: Secondary | ICD-10-CM | POA: Diagnosis not present

## 2024-08-26 DIAGNOSIS — N529 Male erectile dysfunction, unspecified: Secondary | ICD-10-CM | POA: Diagnosis not present

## 2024-08-26 DIAGNOSIS — E538 Deficiency of other specified B group vitamins: Secondary | ICD-10-CM

## 2024-08-26 DIAGNOSIS — R739 Hyperglycemia, unspecified: Secondary | ICD-10-CM | POA: Diagnosis not present

## 2024-08-26 DIAGNOSIS — M25472 Effusion, left ankle: Secondary | ICD-10-CM | POA: Diagnosis not present

## 2024-08-26 DIAGNOSIS — E559 Vitamin D deficiency, unspecified: Secondary | ICD-10-CM

## 2024-08-26 DIAGNOSIS — N138 Other obstructive and reflux uropathy: Secondary | ICD-10-CM | POA: Diagnosis not present

## 2024-08-26 DIAGNOSIS — M25471 Effusion, right ankle: Secondary | ICD-10-CM | POA: Diagnosis not present

## 2024-08-26 DIAGNOSIS — N401 Enlarged prostate with lower urinary tract symptoms: Secondary | ICD-10-CM | POA: Diagnosis not present

## 2024-08-26 LAB — VITAMIN D 25 HYDROXY (VIT D DEFICIENCY, FRACTURES): VITD: 72.43 ng/mL (ref 30.00–100.00)

## 2024-08-26 LAB — BASIC METABOLIC PANEL WITH GFR
BUN: 16 mg/dL (ref 6–23)
CO2: 29 meq/L (ref 19–32)
Calcium: 9.2 mg/dL (ref 8.4–10.5)
Chloride: 105 meq/L (ref 96–112)
Creatinine, Ser: 1 mg/dL (ref 0.40–1.50)
GFR: 87.99 mL/min
Glucose, Bld: 84 mg/dL (ref 70–99)
Potassium: 3.7 meq/L (ref 3.5–5.1)
Sodium: 141 meq/L (ref 135–145)

## 2024-08-26 LAB — CBC WITH DIFFERENTIAL/PLATELET
Basophils Absolute: 0.1 K/uL (ref 0.0–0.1)
Basophils Relative: 1.4 % (ref 0.0–3.0)
Eosinophils Absolute: 0.2 K/uL (ref 0.0–0.7)
Eosinophils Relative: 5.5 % — ABNORMAL HIGH (ref 0.0–5.0)
HCT: 43.2 % (ref 39.0–52.0)
Hemoglobin: 14.9 g/dL (ref 13.0–17.0)
Lymphocytes Relative: 19.8 % (ref 12.0–46.0)
Lymphs Abs: 0.8 K/uL (ref 0.7–4.0)
MCHC: 34.4 g/dL (ref 30.0–36.0)
MCV: 86.8 fl (ref 78.0–100.0)
Monocytes Absolute: 0.4 K/uL (ref 0.1–1.0)
Monocytes Relative: 9.1 % (ref 3.0–12.0)
Neutro Abs: 2.6 K/uL (ref 1.4–7.7)
Neutrophils Relative %: 64.2 % (ref 43.0–77.0)
Platelets: 140 K/uL — ABNORMAL LOW (ref 150.0–400.0)
RBC: 4.98 Mil/uL (ref 4.22–5.81)
RDW: 13.1 % (ref 11.5–15.5)
WBC: 4 K/uL (ref 4.0–10.5)

## 2024-08-26 LAB — URINALYSIS, ROUTINE W REFLEX MICROSCOPIC
Bilirubin Urine: NEGATIVE
Hgb urine dipstick: NEGATIVE
Ketones, ur: NEGATIVE
Nitrite: NEGATIVE
RBC / HPF: NONE SEEN
Specific Gravity, Urine: 1.02 (ref 1.000–1.030)
Total Protein, Urine: NEGATIVE
Urine Glucose: NEGATIVE
Urobilinogen, UA: 1 (ref 0.0–1.0)
pH: 6.5 (ref 5.0–8.0)

## 2024-08-26 LAB — HEPATIC FUNCTION PANEL
ALT: 20 U/L (ref 3–53)
AST: 18 U/L (ref 5–37)
Albumin: 4.2 g/dL (ref 3.5–5.2)
Alkaline Phosphatase: 43 U/L (ref 39–117)
Bilirubin, Direct: 0.2 mg/dL (ref 0.1–0.3)
Total Bilirubin: 1.1 mg/dL (ref 0.2–1.2)
Total Protein: 6.1 g/dL (ref 6.0–8.3)

## 2024-08-26 LAB — LIPID PANEL
Cholesterol: 161 mg/dL (ref 28–200)
HDL: 54 mg/dL
LDL Cholesterol: 89 mg/dL (ref 10–99)
NonHDL: 106.65
Total CHOL/HDL Ratio: 3
Triglycerides: 88 mg/dL (ref 10.0–149.0)
VLDL: 17.6 mg/dL (ref 0.0–40.0)

## 2024-08-26 LAB — VITAMIN B12: Vitamin B-12: 1089 pg/mL — ABNORMAL HIGH (ref 211–911)

## 2024-08-26 LAB — SEDIMENTATION RATE: Sed Rate: 1 mm/h (ref 0–20)

## 2024-08-26 LAB — PSA: PSA: 0.32 ng/mL (ref 0.10–4.00)

## 2024-08-26 LAB — TESTOSTERONE: Testosterone: 276.05 ng/dL — ABNORMAL LOW (ref 300.00–890.00)

## 2024-08-26 LAB — BRAIN NATRIURETIC PEPTIDE: Pro B Natriuretic peptide (BNP): 5 pg/mL (ref 1.0–100.0)

## 2024-08-26 LAB — C-REACTIVE PROTEIN: CRP: <0.5 mg/dL — ABNORMAL LOW (ref 1.0–20.0)

## 2024-08-26 LAB — HEMOGLOBIN A1C: Hgb A1c MFr Bld: 4.7 % (ref 4.6–6.5)

## 2024-08-26 LAB — TSH: TSH: 0.82 u[IU]/mL (ref 0.35–5.50)

## 2024-08-26 NOTE — Progress Notes (Signed)
" ° °  Subjective:    Patient ID: Alexander [name], male    DOB: Dec 21, 1975, 49 y.o.   MRN: [id number]  HPI Here for 2 weeks of mild swelling in both feet and ankles. He has never had this before. No SOB. No recent changes in diet or medications.    Review of Systems  Constitutional: Negative.   Respiratory: Negative.    Cardiovascular:  Positive for leg swelling. Negative for chest pain and palpitations.  Gastrointestinal: Negative.   Genitourinary: Negative.        Objective:   Physical Exam Constitutional:      Appearance: He is not ill-appearing.     Comments: In a wheelchair   Cardiovascular:     Rate and Rhythm: Normal rate and regular rhythm.     Pulses: Normal pulses.     Heart sounds: Normal heart sounds.  Pulmonary:     Effort: Pulmonary effort is normal.     Breath sounds: Normal breath sounds.  Musculoskeletal:     Comments: 1+ edema in both ankles and feet   Neurological:     Mental Status: He is alert.           Assessment & Plan:  Ankle edema of uncertain etiology. To treat this he can elevate his legs and wear compression stockings. We will get labs to investigate this further. Garnette Olmsted, MD   "

## 2024-08-27 ENCOUNTER — Ambulatory Visit: Payer: Self-pay | Admitting: Family Medicine

## 2024-08-27 ENCOUNTER — Encounter: Payer: Self-pay | Admitting: Family Medicine

## 2024-08-27 MED ORDER — SULFAMETHOXAZOLE-TRIMETHOPRIM 800-160 MG PO TABS: 1.0000 | ORAL_TABLET | Freq: Two times a day (BID) | ORAL | 0 refills | Status: AC

## 2024-08-27 NOTE — Addendum Note (Signed)
 Addended by: JOHNNY SENIOR A on: 08/27/2024 08:01 AM   Modules accepted: Orders

## 2024-08-30 ENCOUNTER — Ambulatory Visit: Admitting: Family Medicine

## 2024-08-31 NOTE — Telephone Encounter (Signed)
 Yes he has a UTI, and I sent in a RX for Bactrim  DS last week (see my Result Note). As for the platelet count, there is nothing he can do to change this. We can recheck it in 90 days

## 2024-09-15 ENCOUNTER — Ambulatory Visit: Admitting: Neurology
